# Patient Record
Sex: Male | Born: 1960
Health system: Southern US, Community
[De-identification: ages and names within clinical notes are randomized; demographics above are authoritative.]

## PROBLEM LIST (undated history)

## (undated) DIAGNOSIS — I639 Cerebral infarction, unspecified: Secondary | ICD-10-CM

## (undated) DIAGNOSIS — N186 End stage renal disease: Secondary | ICD-10-CM

## (undated) DIAGNOSIS — E119 Type 2 diabetes mellitus without complications: Secondary | ICD-10-CM

## (undated) DIAGNOSIS — N189 Chronic kidney disease, unspecified: Secondary | ICD-10-CM

## (undated) DIAGNOSIS — I10 Essential (primary) hypertension: Secondary | ICD-10-CM

## (undated) DIAGNOSIS — M1990 Unspecified osteoarthritis, unspecified site: Secondary | ICD-10-CM

## (undated) HISTORY — DX: Type 2 diabetes mellitus without complications: E11.9

## (undated) HISTORY — PX: NO PAST SURGERIES: SHX2092

## (undated) HISTORY — DX: Chronic kidney disease, unspecified: N18.9

## (undated) HISTORY — DX: Essential (primary) hypertension: I10

---

## 1998-01-30 ENCOUNTER — Ambulatory Visit (HOSPITAL_COMMUNITY): Admission: RE | Admit: 1998-01-30 | Discharge: 1998-01-30 | Payer: Self-pay | Admitting: Gastroenterology

## 2003-08-05 ENCOUNTER — Ambulatory Visit (HOSPITAL_COMMUNITY): Admission: RE | Admit: 2003-08-05 | Discharge: 2003-08-05 | Payer: Self-pay | Admitting: Family Medicine

## 2004-08-27 ENCOUNTER — Encounter: Admission: RE | Admit: 2004-08-27 | Discharge: 2004-11-25 | Payer: Self-pay | Admitting: Family Medicine

## 2005-12-12 ENCOUNTER — Ambulatory Visit (HOSPITAL_BASED_OUTPATIENT_CLINIC_OR_DEPARTMENT_OTHER): Admission: RE | Admit: 2005-12-12 | Discharge: 2005-12-12 | Payer: Self-pay | Admitting: Urology

## 2005-12-18 ENCOUNTER — Encounter: Admission: RE | Admit: 2005-12-18 | Discharge: 2005-12-18 | Payer: Self-pay | Admitting: Family Medicine

## 2005-12-26 ENCOUNTER — Ambulatory Visit (HOSPITAL_BASED_OUTPATIENT_CLINIC_OR_DEPARTMENT_OTHER): Admission: RE | Admit: 2005-12-26 | Discharge: 2005-12-26 | Payer: Self-pay | Admitting: Urology

## 2006-06-04 ENCOUNTER — Ambulatory Visit: Payer: Self-pay | Admitting: Family Medicine

## 2006-06-11 ENCOUNTER — Ambulatory Visit: Payer: Self-pay | Admitting: Family Medicine

## 2007-01-01 ENCOUNTER — Ambulatory Visit: Payer: Self-pay | Admitting: Family Medicine

## 2007-01-04 ENCOUNTER — Ambulatory Visit: Payer: Self-pay | Admitting: Family Medicine

## 2007-04-21 ENCOUNTER — Ambulatory Visit: Payer: Self-pay | Admitting: Family Medicine

## 2010-05-28 ENCOUNTER — Ambulatory Visit: Payer: Self-pay | Admitting: Family Medicine

## 2010-06-27 ENCOUNTER — Ambulatory Visit: Payer: Self-pay | Admitting: Family Medicine

## 2010-08-28 ENCOUNTER — Ambulatory Visit: Payer: Self-pay | Admitting: Family Medicine

## 2010-10-02 ENCOUNTER — Ambulatory Visit: Payer: Federal, State, Local not specified - PPO

## 2010-10-02 DIAGNOSIS — E119 Type 2 diabetes mellitus without complications: Secondary | ICD-10-CM

## 2010-10-02 DIAGNOSIS — I10 Essential (primary) hypertension: Secondary | ICD-10-CM

## 2010-10-04 ENCOUNTER — Encounter: Payer: Self-pay | Admitting: Internal Medicine

## 2010-10-10 ENCOUNTER — Encounter: Payer: Self-pay | Admitting: Internal Medicine

## 2010-10-15 ENCOUNTER — Encounter: Payer: Self-pay | Admitting: Family Medicine

## 2010-10-15 ENCOUNTER — Ambulatory Visit: Payer: Federal, State, Local not specified - PPO | Admitting: Family Medicine

## 2010-10-15 DIAGNOSIS — E1129 Type 2 diabetes mellitus with other diabetic kidney complication: Secondary | ICD-10-CM | POA: Insufficient documentation

## 2010-10-15 DIAGNOSIS — E785 Hyperlipidemia, unspecified: Secondary | ICD-10-CM | POA: Insufficient documentation

## 2010-10-15 DIAGNOSIS — E1165 Type 2 diabetes mellitus with hyperglycemia: Secondary | ICD-10-CM | POA: Insufficient documentation

## 2010-10-15 DIAGNOSIS — Z9119 Patient's noncompliance with other medical treatment and regimen: Secondary | ICD-10-CM | POA: Insufficient documentation

## 2010-10-15 DIAGNOSIS — I10 Essential (primary) hypertension: Secondary | ICD-10-CM | POA: Insufficient documentation

## 2010-10-15 DIAGNOSIS — R809 Proteinuria, unspecified: Secondary | ICD-10-CM | POA: Insufficient documentation

## 2010-10-15 DIAGNOSIS — Z91199 Patient's noncompliance with other medical treatment and regimen due to unspecified reason: Secondary | ICD-10-CM | POA: Insufficient documentation

## 2010-10-15 DIAGNOSIS — E782 Mixed hyperlipidemia: Secondary | ICD-10-CM

## 2010-10-15 LAB — CONVERTED CEMR LAB
ALT: 18 units/L (ref 0–53)
AST: 12 units/L (ref 0–37)
Albumin: 4 g/dL (ref 3.5–5.2)
Alkaline Phosphatase: 60 units/L (ref 39–117)
BUN: 12 mg/dL (ref 6–23)
Bilirubin Urine: NEGATIVE
CO2: 26 meq/L (ref 19–32)
Calcium: 9.3 mg/dL (ref 8.4–10.5)
Chloride: 102 meq/L (ref 96–112)
Cholesterol: 212 mg/dL — ABNORMAL HIGH (ref 0–200)
Creatinine, Ser: 0.78 mg/dL (ref 0.40–1.50)
Glucose, Bld: 192 mg/dL — ABNORMAL HIGH (ref 70–99)
Glucose, Urine, Semiquant: 100
HCT: 40 % (ref 39.0–52.0)
HDL: 41 mg/dL (ref >39)
Hemoglobin: 13.5 g/dL (ref 13.0–17.0)
Hgb A1c MFr Bld: 8.3 % — ABNORMAL HIGH (ref <5.7)
Ketones, urine, test strip: NEGATIVE
LDL Cholesterol: 150 mg/dL — ABNORMAL HIGH (ref 0–99)
MCHC: 33.8 g/dL (ref 30.0–36.0)
MCV: 83.5 fL (ref 78.0–100.0)
Microalbumin U total vol: 150 mg/L
Nitrite: POSITIVE
Platelets: 279 10*3/uL (ref 150–400)
Potassium: 4.4 meq/L (ref 3.5–5.3)
Protein, U semiquant: 100
RBC: 4.79 M/uL (ref 4.22–5.81)
RDW: 13.7 % (ref 11.5–15.5)
Sodium: 138 meq/L (ref 135–145)
Specific Gravity, Urine: 1.03
Total Bilirubin: 0.5 mg/dL (ref 0.3–1.2)
Total CHOL/HDL Ratio: 5.2
Total Protein: 7.3 g/dL (ref 6.0–8.3)
Triglycerides: 104 mg/dL (ref <150)
Urobilinogen, UA: 0.2
VLDL: 21 mg/dL (ref 0–40)
Vit D, 25-Hydroxy: 11 ng/mL — ABNORMAL LOW (ref 30–89)
WBC: 7.4 10*3/uL (ref 4.0–10.5)
pH: 6

## 2010-10-16 ENCOUNTER — Encounter: Payer: Self-pay | Admitting: Family Medicine

## 2010-10-17 ENCOUNTER — Encounter: Payer: Self-pay | Admitting: Family Medicine

## 2010-10-17 NOTE — Letter (Signed)
Summary: work excuse  work excuse   Imported By: Orma Flaming 10/10/2010 12:17:51  _____________________________________________________________________  External Attachment:    Type:   Image     Comment:   External Document

## 2010-10-17 NOTE — Progress Notes (Signed)
Summary: Office Visit 10/02/10  Office Visit 10/02/10   Imported By: Opal Sidles Breitmeier 10/10/2010 12:15:07  _____________________________________________________________________  External Attachment:    Type:   Image     Comment:   External Document

## 2010-10-17 NOTE — Progress Notes (Signed)
Summary: office visit 10/02/10  office visit 10/02/10   Imported By: Opal Sidles Breitmeier 10/10/2010 12:14:06  _____________________________________________________________________  External Attachment:    Type:   Image     Comment:   External Document

## 2010-10-17 NOTE — Assessment & Plan Note (Signed)
Summary: headaches, lower bottom pain

## 2010-10-21 ENCOUNTER — Encounter: Payer: Self-pay | Admitting: Family Medicine

## 2010-10-21 ENCOUNTER — Ambulatory Visit (INDEPENDENT_AMBULATORY_CARE_PROVIDER_SITE_OTHER): Payer: Federal, State, Local not specified - PPO | Admitting: Family Medicine

## 2010-10-21 DIAGNOSIS — R809 Proteinuria, unspecified: Secondary | ICD-10-CM

## 2010-10-21 DIAGNOSIS — E1165 Type 2 diabetes mellitus with hyperglycemia: Secondary | ICD-10-CM

## 2010-10-21 DIAGNOSIS — Z9119 Patient's noncompliance with other medical treatment and regimen: Secondary | ICD-10-CM

## 2010-10-21 DIAGNOSIS — I10 Essential (primary) hypertension: Secondary | ICD-10-CM

## 2010-10-21 DIAGNOSIS — E559 Vitamin D deficiency, unspecified: Secondary | ICD-10-CM

## 2010-10-21 DIAGNOSIS — E1129 Type 2 diabetes mellitus with other diabetic kidney complication: Secondary | ICD-10-CM

## 2010-10-21 DIAGNOSIS — E785 Hyperlipidemia, unspecified: Secondary | ICD-10-CM

## 2010-10-23 ENCOUNTER — Encounter: Payer: Self-pay | Admitting: Family Medicine

## 2010-10-23 NOTE — Letter (Signed)
Summary: Generic Letter  [facility] [place], [place] [address]   Phone: [phone]  Fax: [phone]    10/15/2010  [address] [place], [place]  [address]  [place]   LAB ORDER:  FOR [facility]  CBC, CMP, A1C, LIPID PANEL, 25-OH VIT D, URINE MICRO AND CULTURE    DIAGNOSES 250.02, 272.2, 401.9, 599.0, 599.1  PLEASE FAX RESULTS TO DR. Wynetta Emery [phone]       Sincerely,   Irwin Brakeman MD

## 2010-10-23 NOTE — Progress Notes (Signed)
Summary: [place] records  [facility] records   Imported By: Boykin Reaper 10/17/2010 14:20:51  _____________________________________________________________________  External Attachment:    Type:   Image     Comment:   External Document

## 2010-10-23 NOTE — Letter (Signed)
Summary: Out of Work  [facility]   [place],  [address]   Phone: [phone]  Fax: [phone]    October 15, 2010   Employee:  Joshua Case    To Whom It May Concern:   For Medical reasons, please excuse the above named employee from work for the following dates:  Start:   October 15, 2010   End:   October 17, 2010  If you need additional information, please feel free to contact our office.         Sincerely,    Irwin Brakeman MD

## 2010-10-23 NOTE — Assessment & Plan Note (Signed)
Summary: diabetes,headaches, prostate exam/jbb   Vital Signs:  Patient profile:   50 year old male Weight:      306 pounds O2 Sat:      99 % on Room air Temp:     98.7 degrees F oral Pulse rate:   76 / minute Resp:     15 per minute BP sitting:   188 / 105  (left arm)  O2 Flow:  Room air  Serial Vital Signs/Assessments:  Time      Position  BP       Pulse  Resp  Temp     By 235 P               177/108  [place] MD   History of Present Illness: The patient presented today because he has been concerned about the chronic daily headaches that he has endured over the past several months and worsening.  He reports that he is having headaches everyday.  He says he has not taken care of himself.  He says that he had stopped all of his medications for a long time and started back on them when he saw Dr. Thereasa Distance last week.  He has been taking 1000 mg of metformin twice daily and started taking Lisinopril 10/12.5 two tablets daily for the past 3 days.  His blood pressure is very high.  He is also reporting that he has a history of chronic prostatitis and is out of his ciprofloxacin.  He never filled it when he saw Dr. Redmond School in October of last year.  He is reporting that he is not having any symptoms of chest pain or shortness of breath.  He says that he is not checking his blood sugar regularly and has been having high BG readings.  He says that he is wanting to start to take better care of himself now.  He has not seen a dentist or eye care specialist in just over 1 year per pt.  He also has a podiatrist but has not seen him recently but is planning to go soon.     Past History:  Past Medical History: HTN Type 2 Diabetes Mellitus Dyslipidemia Metabolic Syndrome Microalbuminuria Chronic Prostatitis Noncompliance  Past Surgical History: Denies surgical history  Family History: HTN DM  Social History: Pt denies tobacco, alcohol and recreational drug  use.  Review of Systems General:  Denies chills, fatigue, fever, loss of appetite, malaise, sleep disorder, sweats, weakness, and weight loss. Eyes:  Complains of blurring and double vision; denies discharge, eye irritation, eye pain, halos, itching, light sensitivity, red eye, vision loss-1 eye, and vision loss-both eyes. ENT:  Complains of sinus pressure; denies decreased hearing, difficulty swallowing, ear discharge, earache, hoarseness, nasal congestion, nosebleeds, postnasal drainage, ringing in ears, and sore throat. CV:  Denies bluish discoloration of lips or nails, chest pain or discomfort, difficulty breathing at night, difficulty breathing while lying down, fainting, fatigue, leg cramps with exertion, lightheadness, near fainting, palpitations, shortness of breath with exertion, swelling of feet, swelling of hands, and weight gain. Resp:  Denies chest discomfort, chest pain with inspiration, cough, coughing up blood, excessive snoring, hypersomnolence, morning headaches, pleuritic, shortness of breath, sputum productive, and wheezing. GI:  Denies abdominal pain, bloody stools, change in bowel habits, constipation, dark tarry stools, diarrhea, excessive appetite, gas, hemorrhoids, indigestion, loss of appetite, nausea, vomiting, vomiting blood,  and yellowish skin color. GU:  Complains of urinary frequency; denies decreased libido, discharge, dysuria, erectile dysfunction, genital sores, hematuria, incontinence, nocturia, and urinary hesitancy. MS:  Denies joint pain, joint redness, joint swelling, loss of strength, low back pain, mid back pain, muscle aches, muscle , cramps, muscle weakness, stiffness, and thoracic pain. Derm:  Denies changes in color of skin, changes in nail beds, dryness, excessive perspiration, flushing, hair loss, insect bite(s), itching, lesion(s), poor wound healing, and rash. Neuro:  Denies brief paralysis, difficulty with concentration, disturbances in coordination,  falling down, headaches, inability to speak, memory loss, numbness, poor balance, seizures, sensation of room spinning, tingling, tremors, visual disturbances, and weakness. Psych:  Denies alternate hallucination ( auditory/visual), anxiety, depression, easily angered, easily tearful, irritability, mental problems, panic attacks, sense of great danger, suicidal thoughts/plans, thoughts of violence, unusual visions or sounds, and thoughts /plans of harming others. Endo:  Complains of excessive thirst, excessive urination, and polyuria. Allergy:  Denies hives or rash, itching eyes, persistent infections, seasonal allergies, and sneezing.  Physical Exam  General:  Well-developed,well-nourished,in no acute distress; alert,appropriate and cooperative throughout examination Head:  Normocephalic and atraumatic without obvious abnormalities. No apparent alopecia or balding. Eyes:  No corneal or conjunctival inflammation noted. EOMI. Perrla. Funduscopic exam benign, without hemorrhages, exudates or papilledema. Vision grossly normal. Ears:  External ear exam shows no significant lesions or deformities.  Otoscopic examination reveals clear canals, tympanic membranes are intact bilaterally without bulging, retraction, inflammation or discharge. Hearing is grossly normal bilaterally. Nose:  External nasal examination shows no deformity or inflammation. Nasal mucosa are pink and moist without lesions or exudates. Mouth:  Oral mucosa and oropharynx without lesions or exudates.  Teeth in good repair. Neck:  No deformities, masses, or tenderness noted. Lungs:  Normal respiratory effort, chest expands symmetrically. Lungs are clear to auscultation, no crackles or wheezes. Heart:  Normal rate and regular rhythm. S1 and S2 normal without gallop, murmur, click, rub or other extra sounds. Abdomen:  Bowel sounds positive,abdomen soft and non-tender without masses, organomegaly or hernias noted. Msk:  No deformity or  scoliosis noted of thoracic or lumbar spine.   Pulses:  R and L carotid,radial,femoral,dorsalis pedis and posterior tibial pulses are full and equal bilaterally Extremities:  No clubbing, cyanosis, edema, or deformity noted with normal full range of motion of all joints.   Neurologic:  No cranial nerve deficits noted. Station and gait are normal. Plantar reflexes are down-going bilaterally. DTRs are symmetrical throughout. Sensory, motor and coordinative functions appear intact. Skin:  Intact without suspicious lesions or rashes Cervical Nodes:  No lymphadenopathy noted Psych:  Cognition and judgment appear intact. Alert and cooperative with normal attention span and concentration. No apparent delusions, illusions, hallucinations  Lab Results Urinalysis:      Color:     Yellow    Appear:     Cloudy    Leuk:     Small (1+)    Nitr:     Pos    Urobil:     0.2    Prot:     100 (2+)    pH:     6.0    Blood:     Trace    Sp. Gr:     1.030    Ket:     Neg    Bili:     Neg    Glu:     100    creatinine 300 mg/dL    U Micralb:   150 mg/L  Problems:  Medical Problems Added: 1)  Dx of Pers Hx Noncompliance W/med Tx Prs [facility]  (ICD-V15.81) 2)  Dx of Microalbuminuria  (ICD-791.0) 3)  Dx of Diab W/renal Manifests Type Ii/uns Type Uncntrl  (ICD-250.42) 4)  Dx of Dyslipidemia  (ICD-272.4) 5)  Dx of Hypertension, Essential, Uncontrolled  (ICD-401.9)  Impression & Recommendations:  Problem # 1:  HYPERTENSION, ESSENTIAL, UNCONTROLLED (ICD-401.9)  His updated medication list for this problem includes:    Toprol Xl 25 Mg Xr24h-tab (Metoprolol succinate) .Marland Kitchen... Take 1 by mouth daily for blood pressure around the same time everyday    Amlodipine Besylate 5 Mg Tabs (Amlodipine besylate) .Marland Kitchen... Take 1 by mouth daily for blood pressure control    Lisinopril-hydrochlorothiazide 20-25 Mg Tabs (Lisinopril-hydrochlorothiazide) .Marland Kitchen... Take 1 by mouth daily for blood pressure  Problem # 2:  DIAB  W/RENAL MANIFESTS TYPE II/UNS TYPE UNCNTRL (ICD-250.42)  His updated medication list for this problem includes:    Lisinopril-hydrochlorothiazide 20-25 Mg Tabs (Lisinopril-hydrochlorothiazide) .Marland Kitchen... Take 1 by mouth daily for blood pressure    Glucophage Xr 500 Mg Xr24h-tab (Metformin hcl) .Marland Kitchen... Take 2 tabs by mouth two times a day ac  Problem # 3:  MICROALBUMINURIA (ICD-791.0)  Problem # 4:  DYSLIPIDEMIA (ICD-272.4)  Problem # 5:  PERS HX NONCOMPLIANCE W/MED TX PRS [facility] (ICD-V15.81)  Complete Medication List: 1)  Ciprofloxacin Hcl 500 Mg Tabs (Ciprofloxacin hcl) .... Take 1 by mouth two times a day until completed 2)  Toprol Xl 25 Mg Xr24h-tab (Metoprolol succinate) .... Take 1 by mouth daily for blood pressure around the same time everyday 3)  Amlodipine Besylate 5 Mg Tabs (Amlodipine besylate) .... Take 1 by mouth daily for blood pressure control 4)  Lisinopril-hydrochlorothiazide 20-25 Mg Tabs (Lisinopril-hydrochlorothiazide) .... Take 1 by mouth daily for blood pressure 5)  Glucophage Xr 500 Mg Xr24h-tab (Metformin hcl) .... Take 2 tabs by mouth two times a day ac  Patient Instructions: 1)  Go to the pharmacy and pick up your prescription (s).  It may take up to 30 mins for electronic prescriptions to be delivered to the pharmacy.  Please call if your pharmacy has not received your prescriptions after 30 minutes.   2)  Check your blood sugars regularly. If your readings are usually above : 250 or below 70 you should contact our office. 3)  It is important that your Diabetic A1c level is checked every 3 months. 4)  See your eye doctor yearly to check for diabetic eye damage. 5)  Check your feet each night for sore areas, calluses or signs of infection. 6)  Check your Blood Pressure regularly. If it is above:140/90 you should make an appointment. 7)  It is important that you exercise regularly at least 20 minutes 5 times a week. If you develop chest pain, have severe difficulty  breathing, or feel very tired , stop exercising immediately and seek medical attention. 8)  You need to lose weight. Consider a lower calorie diet and regular exercise.  9)  The patient was informed that there is no on-call provider or services available at this clinic during off-hours (when the clinic is closed).  If the patient developed a problem or concern that required immediate attention, the patient was advised to go the the nearest available urgent care or emergency department for medical care.  The patient verbalized understanding.    10)  Return or go to the ER if no improvement or symptoms getting worse.   11)  Come back in  1 week for BP and DM check 12)  Go to the lab and get the lab work done later today as requested.  Prescriptions: GLUCOPHAGE XR 500 MG XR24H-TAB (METFORMIN HCL) take 2 tabs by mouth two times a day AC  #120 x 3   Entered and Authorized by:   Irwin Brakeman MD   Signed by:   Irwin Brakeman MD on 10/15/2010   Method used:   Electronically to        [facility] (retail)       [address]       [place], [place]  [address]       Ph: [phone]       Fax: [phone]   RxID:   [id number] LISINOPRIL-HYDROCHLOROTHIAZIDE 20-25 MG TABS (LISINOPRIL-HYDROCHLOROTHIAZIDE) take 1 by mouth daily for blood pressure  #30 x 2   Entered and Authorized by:   Irwin Brakeman MD   Signed by:   Irwin Brakeman MD on 10/15/2010   Method used:   Electronically to        [facility] (retail)       [address]       [place], [place]  [address]       Ph: [phone]       Fax: [phone]   RxID:   [id number] AMLODIPINE BESYLATE 5 MG TABS (AMLODIPINE BESYLATE) take 1 by mouth daily for blood pressure control  #30 x 2   Entered and Authorized by:   Irwin Brakeman MD   Signed by:   Irwin Brakeman MD on 10/15/2010   Method used:   Electronically to        [facility] (retail)       [address]       [place], [place]  [address]       Ph: [phone]       Fax: [phone]   RxID:   [id number] TOPROL XL 25 MG XR24H-TAB (METOPROLOL SUCCINATE) take 1 by mouth daily for blood pressure around the same time everyday  #30 x 2   Entered and Authorized by:   Irwin Brakeman MD   Signed by:   Irwin Brakeman MD on 10/15/2010   Method used:   Electronically to        [facility] [address] (retail)       [address]       [place], [place]  [address]       Ph: [phone]       Fax: [phone]   RxID:   [id number] CIPROFLOXACIN HCL 500 MG TABS (CIPROFLOXACIN HCL) take 1 by mouth two times a day until completed  #20 x 0   Entered and Authorized by:   Irwin Brakeman MD   Signed by:   Irwin Brakeman MD on 10/15/2010   Method used:   Electronically to        [facility] (retail)       [address]       [place], [place]  [address]       Ph: [phone]       Fax: [phone]   RxID:   [id number]    Medication Administration  Medication # 1:  Medication: Clonidine 0.1mg  tab    Dose: 2 tablets    Route: po    I spent more than 65 mins with patient reviewing his complex medical history and trying to formulate a clear action plan with the patient and treating his uncontrolled hypertension.  I did serial BP assessments on  him as well.  I explained to the patient that he had microalbumin positive urine.  The patient verbalized understanding.  The risks, benefits and possible side effects were clearly explained and discussed with the patient.  The patient verbalized clear understanding.  The patient was given instructions to return if symptoms don't improve, worsen or new changes develop.  If it is not during clinic hours and the patient cannot get back to this clinic then the patient was told to seek medical care at an available urgent care or emergency department.  The patient  verbalized understanding.

## 2010-10-23 NOTE — Letter (Signed)
Summary: Work excuse letter  Work excuse letter   Imported By: Boykin Reaper 10/17/2010 14:17:23  _____________________________________________________________________  External Attachment:    Type:   Image     Comment:   External Document

## 2010-10-23 NOTE — Miscellaneous (Signed)
Summary: New RX Sent  Clinical Lists Changes  Medications: Added new medication of DRISDOL 03474 UNIT CAPS (ERGOCALCIFEROL) take 1 by mouth 2 times per week until completed - Signed Added new medication of PRAVASTATIN SODIUM 40 MG TABS (PRAVASTATIN SODIUM) take 1 by mouth at bedtime for cholesterol - Signed Rx of DRISDOL 25956 UNIT CAPS (ERGOCALCIFEROL) take 1 by mouth 2 times per week until completed;  #16 x 0;  Signed;  Entered by: Irwin Brakeman MD;  Authorized by: Irwin Brakeman MD;  Method used: Electronically to [facility] [address] [place], [place], [place]  [address], Ph: [phone], Fax: [phone] Rx of PRAVASTATIN SODIUM 40 MG TABS (PRAVASTATIN SODIUM) take 1 by mouth at bedtime for cholesterol;  #30 x 2;  Signed;  Entered by: Irwin Brakeman MD;  Authorized by: Irwin Brakeman MD;  Method used: Electronically to [facility] [address], [place], [place], [place]  [address], Ph: [phone], Fax: [phone]    Prescriptions: PRAVASTATIN SODIUM 40 MG TABS (PRAVASTATIN SODIUM) take 1 by mouth at bedtime for cholesterol  #30 x 2   Entered and Authorized by:   Irwin Brakeman MD   Signed by:   Irwin Brakeman MD on 10/16/2010   Method used:   Electronically to        [facility] (retail)       [address]       [place], [place]  [address]       Ph: [phone]       Fax: [phone]   RxID:   [id number] DRISDOL 16606 UNIT CAPS (ERGOCALCIFEROL) take 1 by mouth 2 times per week until completed  #16 x 0   Entered and Authorized by:   Irwin Brakeman MD   Signed by:   Irwin Brakeman MD on 10/16/2010   Method used:   Electronically to        [facility] [address] (retail)       [address]       [place], [place]  [address]       Ph: [phone]       Fax: [phone]   RxID:   [id number]

## 2010-10-29 NOTE — Assessment & Plan Note (Signed)
Summary: CHECK BLOOD PRESSURE/EVM   Vital Signs:  Patient profile:   50 year old male O2 Sat:      100 % on Room air Temp:     97.7 degrees F oral Pulse rate:   96 / minute Pulse rhythm:   regular Resp:     14 per minute BP sitting:   148 / 96  (right arm)  O2 Flow:  Room air  History of Present Illness: The patient presented today for a followup appointment as we had discussed one week ago. The patient says that he is no longer having the headaches. He only reports that he is taking one of the blood pressure medications and did not take the other 2 prescribed. He reports that he is not taking the amlodipine or Toprol at this time. He wanted to get more clarification today. He reports that he is feeling somewhat better. He reports that he still urinating frequently. I explained to him that the ciprofloxacin is not effective for the Escherichia coli strain that he has in his urine. I explained to him that he needed to take the new prescription for antibiotic that I called in for him called cephalexin. The patient reports that he Has Seen a Urologist in the Past but has not seen one recently. He reports that he is still having some trouble with his erections. He reports that his erections are not as firm as they used to be 10 years ago. He reports that he is still able to get an erection. The patient reports that he is taking his medication. The patient reports that he only tests his blood sugar one time and it was 170. He reports that he is having signif metformin. I told him that I had called in a different formulation of metformin in an extended release form at that he could take with less GI side effects. The patient verbalized understanding. The patient denies having chest pain and shortness of breath. The patient reports that he is still wanting to improve. I explained to the patient that he needs to test his blood sugars more frequently and watch his diet. We discussed better dietary options for  him including avoiding fast foods and high sodium. The patient verbalized understanding.  Allergies: No Known Drug Allergies  Past History:  Family History: Last updated: 10/15/2010 HTN DM  Social History: Last updated: 10/21/2010 Pt denies tobacco, alcohol and recreational drug use. The patient is married. The patient has 2 children in college.  Past Medical History: HTN Type 2 Diabetes Mellitus Dyslipidemia Metabolic Syndrome Microalbuminuria Chronic Prostatitis Noncompliance Vitamin D deficiency  Past Surgical History: Reviewed history from 10/15/2010 and no changes required. Denies surgical history  Family History: Reviewed history from 10/15/2010 and no changes required. HTN DM  Social History: Reviewed history from 10/15/2010 and no changes required. Pt denies tobacco, alcohol and recreational drug use. The patient is married. The patient has 2 children in college.  Review of Systems General:  Denies chills, fatigue, fever, loss of appetite, malaise, sleep disorder, sweats, weakness, and weight loss. Eyes:  Denies blurring, discharge, double vision, eye irritation, eye pain, halos, itching, light sensitivity, red eye, vision loss-1 eye, and vision loss-both eyes. ENT:  Denies decreased hearing, difficulty swallowing, ear discharge, earache, hoarseness, nasal congestion, nosebleeds, postnasal drainage, ringing in ears, sinus pressure, and sore throat. CV:  Denies bluish discoloration of lips or nails, chest pain or discomfort, difficulty breathing at night, difficulty breathing while lying down, fainting, fatigue, leg cramps  with exertion, lightheadness, near fainting, palpitations, shortness of breath with exertion, swelling of feet, swelling of hands, and weight gain. Resp:  Denies chest discomfort, chest pain with inspiration, cough, coughing up blood, excessive snoring, hypersomnolence, morning headaches, pleuritic, shortness of breath, sputum productive, and  wheezing. GI:  Denies abdominal pain, bloody stools, change in bowel habits, constipation, dark tarry stools, diarrhea, excessive appetite, gas, hemorrhoids, indigestion, loss of appetite, nausea, vomiting, vomiting blood, and yellowish skin color. GU:  Complains of urinary frequency. MS:  Denies joint pain, joint redness, joint swelling, loss of strength, low back pain, mid back pain, muscle aches, muscle , cramps, muscle weakness, stiffness, and thoracic pain. Derm:  Denies changes in color of skin, changes in nail beds, dryness, excessive perspiration, flushing, hair loss, insect bite(s), itching, lesion(s), poor wound healing, and rash. Neuro:  Denies brief paralysis, difficulty with concentration, disturbances in coordination, falling down, headaches, inability to speak, memory loss, numbness, poor balance, seizures, sensation of room spinning, tingling, tremors, visual disturbances, and weakness. Psych:  Denies alternate hallucination ( auditory/visual), anxiety, depression, easily angered, easily tearful, irritability, mental problems, panic attacks, sense of great danger, suicidal thoughts/plans, thoughts of violence, unusual visions or sounds, and thoughts /plans of harming others.  Physical Exam  General:  Well-developed,well-nourished,in no acute distress; alert,appropriate and cooperative throughout examination Head:  Normocephalic and atraumatic without obvious abnormalities. No apparent alopecia or balding. Eyes:  No corneal or conjunctival inflammation noted. EOMI. Perrla. Funduscopic exam benign, without hemorrhages, exudates or papilledema. Vision grossly normal. Ears:  External ear exam shows no significant lesions or deformities.  Otoscopic examination reveals clear canals, tympanic membranes are intact bilaterally without bulging, retraction, inflammation or discharge. Hearing is grossly normal bilaterally. Nose:  External nasal examination shows no deformity or inflammation. Nasal  mucosa are pink and moist without lesions or exudates. Mouth:  Oral mucosa and oropharynx without lesions or exudates.  Teeth in good repair. Neck:  No deformities, masses, or tenderness noted. Lungs:  Normal respiratory effort, chest expands symmetrically. Lungs are clear to auscultation, no crackles or wheezes. Heart:  Normal rate and regular rhythm. S1 and S2 normal without gallop, murmur, click, rub or other extra sounds. Abdomen:  Bowel sounds positive,abdomen soft and non-tender without masses, organomegaly or hernias noted. obese. Msk:  No deformity or scoliosis noted of thoracic or lumbar spine.   Pulses:  R and L carotid,radial,femoral,dorsalis pedis and posterior tibial pulses are full and equal bilaterally Extremities:  No clubbing, cyanosis, edema, or deformity noted with normal full range of motion of all joints.   Neurologic:  No cranial nerve deficits noted. Station and gait are normal. Plantar reflexes are down-going bilaterally. DTRs are symmetrical throughout. Sensory, motor and coordinative functions appear intact. Skin:  Intact without suspicious lesions or rashes Cervical Nodes:  No lymphadenopathy noted Psych:  Cognition and judgment appear intact. Alert and cooperative with normal attention span and concentration. No apparent delusions, illusions, hallucinations   Problems:  Medical Problems Added: 1)  Dx of Unspecified Vitamin D Deficiency  (ICD-268.9)  Impression & Recommendations:  Problem # 1:  HYPERTENSION, ESSENTIAL, UNCONTROLLED (ICD-401.9)  His updated medication list for this problem includes:    Toprol Xl 25 Mg Xr24h-tab (Metoprolol succinate) .Marland Kitchen... Take 1 by mouth daily for blood pressure around the same time everyday    Amlodipine Besylate 5 Mg Tabs (Amlodipine besylate) .Marland Kitchen... Take 1 by mouth daily for blood pressure control    Lisinopril-hydrochlorothiazide 20-25 Mg Tabs (Lisinopril-hydrochlorothiazide) .Marland Kitchen... Take 1 by mouth daily for  blood  pressure  Because the patient is starting to experience correction difficulties, also, because he is having some improvement in blood pressure control now, I asked the patient to take the lisinopril hydrochlorothiazide and the Toprol-XL every morning. I asked him to hold the amlodipine 5 mg tablets until he is reevaluated in 3 weeks. I think that the amlodipine may worse the erection problems. Also, I explained to the patient that blood pressure medication in general may affect male sexual potency. The patient verbalized understanding.  I continue to explained to the patient that a low sodium diet was essential and regular exercise was needed. The patient verbalized understanding. I asked him to please followup in 3 weeks for recheck and to call if his symptoms are not improving. I also asked the patient to make an appointment to see his urologist regarding his sexual problems. Also I like for his prostate to be evaluated again because of his history of prostatitis.  Problem # 2:  UNSPECIFIED VITAMIN D DEFICIENCY (ICD-268.9) The patient was given instructions to continue taking the 50,000 international unit vitamin D capsules for the next 8 weeks. After that I gave the patient permission to continue taking vitamin D supplements over-the-counter daily.  Problem # 3:  MICROALBUMINURIA (ICD-791.0) I gave the patient instructions to Continue Taking Lisinopril Hydrochlorothiazide Daily and Controlling Blood Pressure.  Problem # 4:  DIAB W/RENAL MANIFESTS TYPE II/UNS TYPE UNCNTRL (ICD-250.42)  His updated medication list for this problem includes:    Lisinopril-hydrochlorothiazide 20-25 Mg Tabs (Lisinopril-hydrochlorothiazide) .Marland Kitchen... Take 1 by mouth daily for blood pressure    Glucophage Xr 500 Mg Xr24h-tab (Metformin hcl) .Marland Kitchen... Take 2 tabs by mouth two times a day ac  I asked the patient to please start taking his blood sugars More regularly and seriously. I asked the patient to take his medications as  prescribed. I told him to discontinue the immediate-release metformin and to start taking the extended release formulation. The patient verbalized understanding.  I also reminded the patient for his annual eye care and biannual dental evaluations. The patient verbalized clear understanding.    Problem # 5:  DYSLIPIDEMIA (ICD-272.4)  His updated medication list for this problem includes:    Pravastatin Sodium 40 Mg Tabs (Pravastatin sodium) .Marland Kitchen... Take 1 by mouth at bedtime for cholesterol  Problem # 6:  PERS HX NONCOMPLIANCE W/MED TX PRS HAZARDS HLTH (ICD-V15.81)  Complete Medication List: 1)  Toprol Xl 25 Mg Xr24h-tab (Metoprolol succinate) .... Take 1 by mouth daily for blood pressure around the same time everyday 2)  Amlodipine Besylate 5 Mg Tabs (Amlodipine besylate) .... Take 1 by mouth daily for blood pressure control 3)  Lisinopril-hydrochlorothiazide 20-25 Mg Tabs (Lisinopril-hydrochlorothiazide) .... Take 1 by mouth daily for blood pressure 4)  Glucophage Xr 500 Mg Xr24h-tab (Metformin hcl) .... Take 2 tabs by mouth two times a day ac 5)  Drisdol 50000 Unit Caps (Ergocalciferol) .... Take 1 by mouth 2 times per week until completed 6)  Pravastatin Sodium 40 Mg Tabs (Pravastatin sodium) .... Take 1 by mouth at bedtime for cholesterol 7)  Cephalexin 250 Mg Tabs (Cephalexin) .... Take 1 by mouth every 6 hours x 10 days  Patient Instructions: 1)  Go to the pharmacy and pick up your prescription (s).  It may take up to 30 mins for electronic prescriptions to be delivered to the pharmacy.  Please call if your pharmacy has not received your prescriptions after 30 minutes.   2)  Check your blood sugars regularly.  If your readings are usually above : 250 or below 70 you should contact our office. 3)  Return or go to the ER if no improvement or symptoms getting worse.   4)  Please return in 3 weeks for recheck. Please continue to monitor your blood pressure at least once per week. Please take her  blood pressure medications as prescribed. 5)  Please hold the amlodipine 5 mg. Did not take the amlodipine at this time.  I would like for you to take your metoprolol and lisinopril medications every day.  Also, please take your cholesterol medication and finish her antibiotics as prescribed. I would like to have your urine retested and cultured in approximately 2 weeks to 3 weeks time. You can have a retested when he followup in 3 weeks. 6)  Please make an appointment to see her urologist to followup on your prostate and other personal matters.     I spent more than 65 minutes with the patient today reviewing his complicated medical history, counseling him with diet and diabetes management. Also, I spent a significant amount of time with the patient explaining to him the appropriate use of his medications and the importance of close followup and regular use of medications for chronic medical conditions like his diabetes and hypertension. The patient verbalized clear understanding.

## 2010-10-29 NOTE — Letter (Signed)
Summary: [facility] of [place]  [address]   [place], [place] [address]   Phone: [phone]  Fax: [phone]    October 21, 2010   Student:  Rockey Situ Borger    To Whom It May Concern:   For Medical reasons, please excuse the above named patient from work for the days missed because of a chronic medical condition that required evaluation and treatment. The patient is cleared to return to work on October 22, 2010.  The patient is being treated for uncontrolled hypertension and diabetes mellitus.      Sincerely,    Irwin Brakeman MD    ****This is a legal document and cannot be tampered with.  Schools are authorized to verify all information and to do so accordingly.

## 2010-10-29 NOTE — Miscellaneous (Signed)
Summary: New RX  Clinical Lists Changes  Medications: Removed medication of CIPROFLOXACIN HCL 500 MG TABS (CIPROFLOXACIN HCL) take 1 by mouth two times a day until completed Added new medication of CEPHALEXIN 250 MG TABS (CEPHALEXIN) take 1 by mouth every 6 hours x 10 days - Signed Rx of CEPHALEXIN 250 MG TABS (CEPHALEXIN) take 1 by mouth every 6 hours x 10 days;  #40 x 0;  Signed;  Entered by: Irwin Brakeman MD;  Authorized by: Irwin Brakeman MD;  Method used: Electronically to [facility] [address], [place], [place], [place]  [address], Ph: [phone], Fax: [phone] Observations: Added new observation of ALLERGY REV: Done (10/21/2010 11:39) Added new observation of NKA: T (10/21/2010 11:39)    Prescriptions: CEPHALEXIN 250 MG TABS (CEPHALEXIN) take 1 by mouth every 6 hours x 10 days  #40 x 0   Entered and Authorized by:   Irwin Brakeman MD   Signed by:   Irwin Brakeman MD on 10/21/2010   Method used:   Electronically to        [facility] (retail)       [address]       [place], [place]  [address]       Ph: [phone]       Fax: [phone]   RxID:   [id number]

## 2010-10-29 NOTE — Letter (Signed)
Summary: Work excuse  Work excuse   Imported By: Boykin Reaper 10/23/2010 19:17:15  _____________________________________________________________________  External Attachment:    Type:   Image     Comment:   External Document

## 2011-01-17 NOTE — Op Note (Signed)
[name], [name]                ACCOUNT NO.:  [id number]   MEDICAL RECORD NO.:  [id number]          PATIENT TYPE:  AMB   LOCATION:  [facility]                         FACILITY:  [facility]   PHYSICIAN:  Hanley Ben, M.D.  DATE OF BIRTH:  25-May-1961   DATE OF PROCEDURE:  12/26/2005  DATE OF DISCHARGE:                                 OPERATIVE REPORT   PREOPERATIVE DIAGNOSIS:  Phimosis.   POSTOPERATIVE DIAGNOSIS:  Phimosis.   PROCEDURE DONE:  Circumcision.   ANESTHESIA:  General.   SURGEON:  Arvil Persons, M.D.   ASSISTANT:  Lucie Leather, MD   BLOOD LOSS:  Minimal.   SPECIMENS:  None applicable.   COMPLICATIONS:  None.   INDICATIONS:  This is a 50 year old gentleman who is not circumcised and has  been suffering from phimosis and balanitis.  After extension counseling, the  patient elected for circumcision.   DESCRIPTION OF PROCEDURE:  Patient was brought to the operating room.  A  time out was taken to properly identify the patient and procedure going to  be done.  General anesthesia was induced.  The patient was placed in the  supine position.  His penile area was prepped and draped in the normal  systolic function.  The foreskin was then pulled across the corona in its  normal, unretracted position.  The area of the skin just above the corona  was marked, and then cut sharply with a knife.  The distal incision was then  made, about 5 mm proximal to the corona on the mucosa.  The foreskin in  between the proximal and the distant incision was then incised and cut  circumferentially.  The bleeders were then coagulated by Bovie cautery.  The  skin was then closed with 4-0 chromic interrupted sutures.  The wound was  then dressed with Vaseline gauze and antibiotic ointment.  Conform dressing  was then applied.  A penile block of 10 cc of lidocaine without epinephrine,  0.25%, was then injected.  Patient was then awakened from anesthesia with no  complications and taken in stable  condition to the PACU.   COMPLICATIONS:  None.   DISPOSITION:  Patient remained stable throughout the procedure and was taken  in stable condition to PACU.   Dr. Janice Norrie was present and participated in the entire procedure, as he was the  primary surgeon.     ______________________________  Joshua Lombard, MD      Hanley Ben, M.D.  Electronically Signed    JH/MEDQ  D:  12/26/2005  T:  12/26/2005  Job:  [id number]

## 2011-04-02 DIAGNOSIS — Z0000 Encounter for general adult medical examination without abnormal findings: Secondary | ICD-10-CM | POA: Insufficient documentation

## 2011-04-02 DIAGNOSIS — M549 Dorsalgia, unspecified: Secondary | ICD-10-CM | POA: Insufficient documentation

## 2011-07-02 ENCOUNTER — Ambulatory Visit: Payer: Federal, State, Local not specified - PPO | Admitting: *Deleted

## 2013-07-13 ENCOUNTER — Ambulatory Visit (INDEPENDENT_AMBULATORY_CARE_PROVIDER_SITE_OTHER): Payer: Federal, State, Local not specified - PPO | Admitting: Family Medicine

## 2013-07-13 VITALS — BP 128/90 | HR 82 | Temp 98.0°F | Resp 16 | Ht 74.0 in | Wt 319.0 lb

## 2013-07-13 DIAGNOSIS — Z23 Encounter for immunization: Secondary | ICD-10-CM

## 2013-07-13 DIAGNOSIS — R3989 Other symptoms and signs involving the genitourinary system: Secondary | ICD-10-CM

## 2013-07-13 DIAGNOSIS — M5126 Other intervertebral disc displacement, lumbar region: Secondary | ICD-10-CM

## 2013-07-13 DIAGNOSIS — M5116 Intervertebral disc disorders with radiculopathy, lumbar region: Secondary | ICD-10-CM | POA: Insufficient documentation

## 2013-07-13 LAB — POCT URINALYSIS DIPSTICK
Bilirubin, UA: NEGATIVE
Glucose, UA: 500
Ketones, UA: NEGATIVE
Leukocytes, UA: NEGATIVE
Nitrite, UA: NEGATIVE
Protein, UA: 100
Spec Grav, UA: >=1.03
Urobilinogen, UA: 0.2
pH, UA: 5

## 2013-07-13 LAB — POCT UA - MICROSCOPIC ONLY
Amorphous: POSITIVE
Bacteria, U Microscopic: NEGATIVE
Casts, Ur, LPF, POC: NEGATIVE
Crystals, Ur, HPF, POC: NEGATIVE
Mucus, UA: POSITIVE
Yeast, UA: NEGATIVE

## 2013-07-13 MED ORDER — METHOCARBAMOL 750 MG PO TABS: ORAL_TABLET | ORAL | Status: DC

## 2013-07-13 MED ORDER — HYDROCODONE-ACETAMINOPHEN 7.5-325 MG PO TABS: 1.0000 | ORAL_TABLET | Freq: Four times a day (QID) | ORAL | Status: DC | PRN

## 2013-07-13 MED ORDER — DICLOFENAC SODIUM 75 MG PO TBEC: 75.0000 mg | DELAYED_RELEASE_TABLET | Freq: Two times a day (BID) | ORAL | Status: DC

## 2013-07-13 NOTE — Progress Notes (Signed)
Subjective: 52 year old man who is here with a couple of problems. For the past week or so he has been having problems with right lumbar radiculopathy going down his leg. He knows of no specific injury. One day he just got up and when he straightened up by his back was hurting him from the back down the leg. He does have a history of well-documented lumbar disc disease, has had several MRIs in the past. He's had to be treated with steroids and other modalities in the past. He has never had surgeries. He works at the [employer], and is on his feet with having to lift a lot of things. He has not been able to works since November 5.  Patient does have some bladder pressure sensation. He has a sensation of needing to urinate sometimes when he doesn't need to go. His groin discomfort is part of this. He has had urinary tract infections in the past, and needs to be certain on that.  He has been taking some Aleve for his back, and thinks he needs something more. He has had steroids for his back in the past, but he is diabetic and his last A1c was probably around 7.5 with glucose is running in the 180 range.  Objective: Overweight male in no major acute distress. The worst his pain has been was around 8/10, and currently is about a 5/10. He is not particularly tender in the lumbar spine and has fair range of motion of his back. Straight leg raise test is positive at about 30 on the right, negative on the left. No incontinence. No suprapubic tenderness.  Assessment: Lumbar disc his disease with right lumbar radiculopathy Bladder pressure  Plan: Check urinalysis If symptoms do not improve within a few more days of rest and pain medication and muscle accident they would need to consider checking additional labs on him and a round of steroids if his diabetes can tolerate it. Work excuse through this weekend.  Results for orders placed in visit on 07/13/13  POCT UA - MICROSCOPIC ONLY      Result  Value Range   WBC, Ur, HPF, POC 0-2     RBC, urine, microscopic 1-4     Bacteria, U Microscopic Neg     Mucus, UA Pos     Epithelial cells, urine per micros 0-2     Crystals, Ur, HPF, POC Neg     Casts, Ur, LPF, POC Neg     Yeast, UA Neg     Amorphous pos    POCT URINALYSIS DIPSTICK      Result Value Range   Color, UA yellow     Clarity, UA clear     Glucose, UA 500     Bilirubin, UA neg     Ketones, UA neg     Spec Grav, UA >=1.030     Blood, UA Small     pH, UA 5.0     Protein, UA 100     Urobilinogen, UA 0.2     Nitrite, UA neg     Leukocytes, UA Negative

## 2013-07-13 NOTE — Patient Instructions (Signed)
Drink plenty of fluids  Take the muscle relaxant one in the morning, one at noon, and 2 at bedtime  Take the diclofenac one twice daily for pain and inflammation. Following this did not take the naproxen/Aleve/ibuprofen  Use the hydrocodone only when needed for severe pain. We will not be able to maintain you on narcotic strength pain medications long-term.   If you're not improving, we will need to check your diabetes further and consider prednisone.

## 2013-10-24 ENCOUNTER — Emergency Department (HOSPITAL_COMMUNITY)
Admission: EM | Admit: 2013-10-24 | Discharge: 2013-10-25 | Disposition: A | Discharge status: Home / Self Care | Payer: Federal, State, Local not specified - PPO | Attending: Emergency Medicine | Admitting: Emergency Medicine

## 2013-10-24 ENCOUNTER — Emergency Department (HOSPITAL_COMMUNITY): Payer: Federal, State, Local not specified - PPO

## 2013-10-24 ENCOUNTER — Encounter (HOSPITAL_COMMUNITY): Payer: Self-pay | Admitting: Emergency Medicine

## 2013-10-24 DIAGNOSIS — Z79899 Other long term (current) drug therapy: Secondary | ICD-10-CM | POA: Insufficient documentation

## 2013-10-24 DIAGNOSIS — G8929 Other chronic pain: Secondary | ICD-10-CM | POA: Insufficient documentation

## 2013-10-24 DIAGNOSIS — R5383 Other fatigue: Secondary | ICD-10-CM

## 2013-10-24 DIAGNOSIS — I10 Essential (primary) hypertension: Secondary | ICD-10-CM | POA: Insufficient documentation

## 2013-10-24 DIAGNOSIS — M5412 Radiculopathy, cervical region: Secondary | ICD-10-CM | POA: Insufficient documentation

## 2013-10-24 DIAGNOSIS — R209 Unspecified disturbances of skin sensation: Secondary | ICD-10-CM | POA: Insufficient documentation

## 2013-10-24 DIAGNOSIS — N390 Urinary tract infection, site not specified: Secondary | ICD-10-CM

## 2013-10-24 DIAGNOSIS — E119 Type 2 diabetes mellitus without complications: Secondary | ICD-10-CM | POA: Insufficient documentation

## 2013-10-24 DIAGNOSIS — R5381 Other malaise: Secondary | ICD-10-CM | POA: Insufficient documentation

## 2013-10-24 LAB — URINALYSIS, ROUTINE W REFLEX MICROSCOPIC
Bilirubin Urine: NEGATIVE
Glucose, UA: 100 mg/dL — AB
Ketones, ur: 15 mg/dL — AB
Nitrite: NEGATIVE
Protein, ur: 100 mg/dL — AB
Specific Gravity, Urine: 1.027 (ref 1.005–1.030)
Urobilinogen, UA: 0.2 mg/dL (ref 0.0–1.0)
pH: 5.5 (ref 5.0–8.0)

## 2013-10-24 LAB — CBG MONITORING, ED: Glucose-Capillary: 199 mg/dL — ABNORMAL HIGH (ref 70–99)

## 2013-10-24 LAB — URINE MICROSCOPIC-ADD ON

## 2013-10-24 NOTE — ED Notes (Signed)
Lab contacted to add-on Urine Culture.

## 2013-10-24 NOTE — ED Notes (Signed)
Spoke with Dr. Roxanne Mins regarding pt and delay. Pt updated.

## 2013-10-24 NOTE — ED Provider Notes (Signed)
CSN: [id number]     Arrival date & time 10/24/13  1354 History   First MD Initiated Contact with Patient 10/24/13 2142     Chief Complaint  Patient presents with  . Arm Pain     (Consider location/radiation/quality/duration/timing/severity/associated sxs/prior Treatment) The history is provided by the patient.   53 year old male has noted neck pain radiating to the left arm and weakness over his left arm over the last month. Weakness has been getting progressively worse. Pain is moderate in intensity and he rates it as 6/10 at its worst. He has noted that he has a difficulty raising his arm over his head and difficulty lifting things with his arm. His job does involve lifting and he can use his left arm has a brace. She has to lift with his right arm. He has noted some numbness involving his left second, third, fourth fingers. He has chronic back pain which is unchanged. He has some mild weakness of his legs which is also chronic and unchanged. Of note, he did have a fall about 2 months ago. He is not taking any medication to try and help his symptoms. He denies bowel or bladder dysfunction.  Past Medical History  Diagnosis Date  . Diabetes mellitus without complication   . Hypertension    History reviewed. No pertinent past surgical history. Family History  Problem Relation Age of Onset  . Heart disease Mother   . Diabetes Father   . Hypertension Father   . Diabetes Daughter   . Hypertension Daughter    History  Substance Use Topics  . Smoking status: Never Smoker   . Smokeless tobacco: Not on file  . Alcohol Use: No    Review of Systems  All other systems reviewed and are negative.      Allergies  Review of patient's allergies indicates no known allergies.  Home Medications   Current Outpatient Rx  Name  Route  Sig  Dispense  Refill  . amLODipine (NORVASC) 5 MG tablet   Oral   Take 5 mg by mouth daily.         Marland Kitchen glyBURIDE (DIABETA) 5 MG tablet   Oral   Take 5  mg by mouth 2 (two) times daily with a meal.         . lisinopril-hydrochlorothiazide (PRINZIDE,ZESTORETIC) 20-12.5 MG per tablet   Oral   Take 1 tablet by mouth daily.         . metFORMIN (GLUCOPHAGE) 1000 MG tablet   Oral   Take 1,000 mg by mouth 2 (two) times daily with a meal.         . methocarbamol (ROBAXIN) 750 MG tablet   Oral   Take 750 mg by mouth every 6 (six) hours as needed for muscle spasms.          BP 129/86  Pulse 80  Temp(Src) 98.4 F (36.9 C) (Oral)  Resp 18  SpO2 97% Physical Exam  Nursing note and vitals reviewed.  53 year old male, resting comfortably and in no acute distress. Vital signs are significant for hypertension with blood pressure 167/97. Oxygen saturation is 92%, which is normal. Head is normocephalic and atraumatic. PERRLA, EOMI. Oropharynx is clear. Neck is mildly tender diffusely without any point tenderness. Pain is elicited with lateral bending of the head to the right. There is no adenopathy or JVD. Back is nontender and there is no CVA tenderness. Lungs are clear without rales, wheezes, or rhonchi. Chest is nontender. Heart has  regular rate and rhythm without murmur. Abdomen is soft, flat, nontender without masses or hepatosplenomegaly and peristalsis is normoactive. Extremities have no cyanosis or edema, full range of motion is present. Skin is warm and dry without rash. Neurologic: Mental status is normal, cranial nerves are intact. There no objective sensory deficits. He has minimal weakness of the right triceps with strength 4+/5, mild weakness of his right biceps with strength 4/5, and moderate weakness of the right deltoid with strength 3/5.  ED Course  Procedures (including critical care time) Labs Review Results for orders placed during the hospital encounter of 10/24/13  URINALYSIS, ROUTINE W REFLEX MICROSCOPIC      Result Value Ref Range   Color, Urine YELLOW  YELLOW   APPearance CLOUDY (*) CLEAR   Specific Gravity,  Urine 1.027  1.005 - 1.030   pH 5.5  5.0 - 8.0   Glucose, UA 100 (*) NEGATIVE mg/dL   Hgb urine dipstick SMALL (*) NEGATIVE   Bilirubin Urine NEGATIVE  NEGATIVE   Ketones, ur 15 (*) NEGATIVE mg/dL   Protein, ur 100 (*) NEGATIVE mg/dL   Urobilinogen, UA 0.2  0.0 - 1.0 mg/dL   Nitrite NEGATIVE  NEGATIVE   Leukocytes, UA MODERATE (*) NEGATIVE  URINE MICROSCOPIC-ADD ON      Result Value Ref Range   Squamous Epithelial / LPF FEW (*) RARE   WBC, UA TOO NUMEROUS TO COUNT  <3 WBC/hpf   Bacteria, UA FEW (*) RARE   Casts HYALINE CASTS (*) NEGATIVE   Urine-Other MUCOUS PRESENT    CBG MONITORING, ED      Result Value Ref Range   Glucose-Capillary 199 (*) 70 - 99 mg/dL   Comment 1 Notify RN     Comment 2 Documented in Chart      Imaging Review Ct Cervical Spine Wo Contrast  10/25/2013   CLINICAL DATA:  Shoulder pain and weakness for 3 weeks, worsening pain with movement.  EXAM: CT CERVICAL SPINE WITHOUT CONTRAST  TECHNIQUE: Multidetector CT imaging of the cervical spine was performed without intravenous contrast. Multiplanar CT image reconstructions were also generated.  COMPARISON:  None available for comparison at time of study interpretation.  FINDINGS: Cervical vertebral bodies and posterior elements are intact and aligned with straightened cervical lordosis. Severe C5-6, moderate to severe C4-5 and C6-7 degenerative disc disease. C1-2 articulation maintained with mild arthropathy. No destructive bony lesions. Small bilateral C7 ribs. Minimal calcific atherosclerosis of the carotid siphons.  Level by level evaluation:  C2-3: Uncovertebral hypertrophy, bowel osseous canal stenosis. Mild left neural foraminal narrowing.  C3-4: Moderate broad-based disc osteophyte complex, mild facet arthropathy. Mild to moderate canal stenosis with severe right greater than left neural foraminal narrowing.  C4-5: Moderate broad-based disc osteophyte complex and mild facet arthropathy. Moderate canal stenosis with  moderate-to-severe bilateral neural foraminal narrowing.  C5-6: Moderate broad-based disc osteophyte complex and minimal facet arthropathy. Moderate canal stenosis with moderate to severe right, severe left neural foraminal narrowing.  C6-7: Moderate broad-based disc osteophyte complex. Moderate canal stenosis with mild to moderate right, moderate left neural foraminal narrowing.  C7-T1: Small broad-based disc osteophyte complex without canal stenosis. Mild neural foraminal narrowing.  IMPRESSION: Straightened cervical lordosis without acute fracture nor malalignment.  Degenerative change of the cervical spine resulting in moderate canal stenosis C4-5 through C6-7, mild-to-moderate C3-4.  Neural foraminal narrowing at all cervical levels, moderate to severe/severe from C3-4 through C5-6.  Constellation of findings would be better characterized on MRI of the cervical spine, as clinically  indicated.   Electronically Signed   By: Elon Alas   On: 10/25/2013 00:05   MDM   Final diagnoses:  Cervical radiculopathy  Urinary tract infection    Neck pain with significant weakness strongly suggestive of cervical radiculopathy. MRI is not immediately available so he'll be sent for CT scan. He'll need to be referred to neurosurgery. Also, urinalysis is significant for UTI.  CT shows multiple areas of potential neural compression, but C5 is the most likely culprit. He is given a prescription for cephalexin for UTI, and oxycodone-acetaminophen for pain. Work note given for no lifting with left arm.  Delora Fuel, MD [id number] [id number]

## 2013-10-24 NOTE — ED Notes (Signed)
Dr. Glick at bedside.  

## 2013-10-24 NOTE — ED Notes (Signed)
Pt reports approx 2 months ago, before Christmas, pt has episode where he "passed out" in his chair and woke back up and every since then he has been experiencing intermittent numbness/tingling and limited/painful AROM with LUE

## 2013-10-24 NOTE — ED Notes (Signed)
Pt reports shoulder pain and weakness about 3 weeks ago. Reports increased pain with movement and has a difficult time rising that arm. States that he was seen at [facility] for the same about a week ago.

## 2013-10-25 MED ORDER — OXYCODONE-ACETAMINOPHEN 5-325 MG PO TABS: 1.0000 | ORAL_TABLET | ORAL | Status: DC | PRN

## 2013-10-25 MED ORDER — CEPHALEXIN 500 MG PO CAPS: 500.0000 mg | ORAL_CAPSULE | Freq: Four times a day (QID) | ORAL | Status: DC

## 2013-10-25 NOTE — Discharge Instructions (Signed)
Your CT scan shows problems at multiple spots in your neck. Call the neurosurgeon tomorrow for an appointment as soon as possible.  Cervical Radiculopathy Cervical radiculopathy happens when a nerve in the neck is pinched or bruised by a slipped (herniated) disk or by arthritic changes in the bones of the cervical spine. This can occur due to an injury or as part of the normal aging process. Pressure on the cervical nerves can cause pain or numbness that runs from your neck all the way down into your arm and fingers. CAUSES  There are many possible causes, including:  Injury.  Muscle tightness in the neck from overuse.  Swollen, painful joints (arthritis).  Breakdown or degeneration in the bones and joints of the spine (spondylosis) due to aging.  Bone spurs that may develop near the cervical nerves. SYMPTOMS  Symptoms include pain, weakness, or numbness in the affected arm and hand. Pain can be severe or irritating. Symptoms may be worse when extending or turning the neck. DIAGNOSIS  Your caregiver will ask about your symptoms and do a physical exam. He or she may test your strength and reflexes. X-rays, CT scans, and MRI scans may be needed in cases of injury or if the symptoms do not go away after a period of time. Electromyography (EMG) or nerve conduction testing may be done to study how your nerves and muscles are working. TREATMENT  Your caregiver may recommend certain exercises to help relieve your symptoms. Cervical radiculopathy can, and often does, get better with time and treatment. If your problems continue, treatment options may include:  Wearing a soft collar for short periods of time.  Physical therapy to strengthen the neck muscles.  Medicines, such as nonsteroidal anti-inflammatory drugs (NSAIDs), oral corticosteroids, or spinal injections.  Surgery. Different types of surgery may be done depending on the cause of your problems. HOME CARE INSTRUCTIONS   Put ice on  the affected area.  Put ice in a plastic bag.  Place a towel between your skin and the bag.  Leave the ice on for 15-20 minutes, 03-04 times a day or as directed by your caregiver.  If ice does not help, you can try using heat. Take a warm shower or bath, or use a hot water bottle as directed by your caregiver.  You may try a gentle neck and shoulder massage.  Use a flat pillow when you sleep.  Only take over-the-counter or prescription medicines for pain, discomfort, or fever as directed by your caregiver.  If physical therapy was prescribed, follow your caregiver's directions.  If a soft collar was prescribed, use it as directed. SEEK IMMEDIATE MEDICAL CARE IF:   Your pain gets much worse and cannot be controlled with medicines.  You have weakness or numbness in your hand, arm, face, or leg.  You have a high fever or a stiff, rigid neck.  You lose bowel or bladder control (incontinence).  You have trouble with walking, balance, or speaking. MAKE SURE YOU:   Understand these instructions.  Will watch your condition.  Will get help right away if you are not doing well or get worse. Document Released: 05/13/2001 Document Revised: 11/10/2011 Document Reviewed: 04/01/2011 [facility] Patient Information 2014 [place], [place].

## 2013-10-26 LAB — URINE CULTURE: Colony Count: >=100000

## 2013-10-27 ENCOUNTER — Telehealth (HOSPITAL_COMMUNITY): Payer: Self-pay | Admitting: Emergency Medicine

## 2013-10-27 NOTE — ED Notes (Signed)
Post ED Visit - Positive Culture Follow-up  Culture report reviewed by antimicrobial stewardship pharmacist: []  Wes Remy, Pharm.D., BCPS [x]  Heide Guile, Pharm.D., BCPS []  Alycia Rossetti, Pharm.D., BCPS []  [place], [place].D., BCPS, AAHIVP []  Legrand Como, Pharm.D., BCPS, AAHIVP  Positive urine culture Treated with Keflex, organism sensitive to the same and no further patient follow-up is required at this time.  Myrna Blazer 10/27/2013, 11:39 AM

## 2013-11-15 ENCOUNTER — Other Ambulatory Visit: Payer: Self-pay | Admitting: Neurosurgery

## 2013-11-15 DIAGNOSIS — M4802 Spinal stenosis, cervical region: Secondary | ICD-10-CM

## 2013-11-28 ENCOUNTER — Ambulatory Visit
Admission: RE | Admit: 2013-11-28 | Discharge: 2013-11-28 | Disposition: A | Discharge status: Home / Self Care | Payer: Federal, State, Local not specified - PPO | Source: Ambulatory Visit | Attending: Neurosurgery | Admitting: Neurosurgery

## 2013-11-28 DIAGNOSIS — M4802 Spinal stenosis, cervical region: Secondary | ICD-10-CM

## 2013-11-30 ENCOUNTER — Other Ambulatory Visit: Payer: Self-pay | Admitting: Neurosurgery

## 2013-12-28 ENCOUNTER — Ambulatory Visit: Payer: Federal, State, Local not specified - PPO | Admitting: *Deleted

## 2014-01-31 ENCOUNTER — Encounter (HOSPITAL_COMMUNITY): Admission: RE | Payer: Self-pay | Source: Ambulatory Visit

## 2014-01-31 ENCOUNTER — Inpatient Hospital Stay (HOSPITAL_COMMUNITY)
Admission: RE | Admit: 2014-01-31 | Payer: Federal, State, Local not specified - PPO | Source: Ambulatory Visit | Admitting: Neurosurgery

## 2014-01-31 SURGERY — ANTERIOR CERVICAL DECOMPRESSION/DISCECTOMY FUSION 3 LEVELS
Anesthesia: General

## 2014-03-31 ENCOUNTER — Ambulatory Visit (INDEPENDENT_AMBULATORY_CARE_PROVIDER_SITE_OTHER): Payer: Federal, State, Local not specified - PPO | Admitting: Emergency Medicine

## 2014-03-31 VITALS — BP 190/100 | HR 79 | Temp 98.1°F | Resp 18 | Ht 72.5 in | Wt 305.6 lb

## 2014-03-31 DIAGNOSIS — M5116 Intervertebral disc disorders with radiculopathy, lumbar region: Secondary | ICD-10-CM

## 2014-03-31 DIAGNOSIS — Z9119 Patient's noncompliance with other medical treatment and regimen: Secondary | ICD-10-CM

## 2014-03-31 DIAGNOSIS — E119 Type 2 diabetes mellitus without complications: Secondary | ICD-10-CM

## 2014-03-31 DIAGNOSIS — I10 Essential (primary) hypertension: Secondary | ICD-10-CM

## 2014-03-31 DIAGNOSIS — M5126 Other intervertebral disc displacement, lumbar region: Secondary | ICD-10-CM

## 2014-03-31 DIAGNOSIS — Z91199 Patient's noncompliance with other medical treatment and regimen due to unspecified reason: Secondary | ICD-10-CM

## 2014-03-31 DIAGNOSIS — E782 Mixed hyperlipidemia: Secondary | ICD-10-CM

## 2014-03-31 LAB — POCT UA - MICROSCOPIC ONLY
Bacteria, U Microscopic: NEGATIVE
Casts, Ur, LPF, POC: NEGATIVE
Crystals, Ur, HPF, POC: NEGATIVE
Mucus, UA: NEGATIVE
WBC, Ur, HPF, POC: NEGATIVE
Yeast, UA: NEGATIVE

## 2014-03-31 LAB — POCT CBC
Granulocyte percent: 64.9 %G (ref 37–80)
HCT, POC: 41.7 % — AB (ref 43.5–53.7)
Hemoglobin: 13.6 g/dL — AB (ref 14.1–18.1)
Lymph, poc: 2.7 (ref 0.6–3.4)
MCH, POC: 28.1 pg (ref 27–31.2)
MCHC: 32.6 g/dL (ref 31.8–35.4)
MCV: 86.2 fL (ref 80–97)
MID (cbc): 0.1 (ref 0–0.9)
MPV: 8.4 fL (ref 0–99.8)
POC Granulocyte: 5.1 (ref 2–6.9)
POC LYMPH PERCENT: 34 %L (ref 10–50)
POC MID %: 1.1 %M (ref 0–12)
Platelet Count, POC: 291 10*3/uL (ref 142–424)
RBC: 4.84 M/uL (ref 4.69–6.13)
RDW, POC: 14 %
WBC: 7.8 10*3/uL (ref 4.6–10.2)

## 2014-03-31 LAB — COMPREHENSIVE METABOLIC PANEL
ALT: 16 U/L (ref 0–53)
AST: 10 U/L (ref 0–37)
Albumin: 4.1 g/dL (ref 3.5–5.2)
Alkaline Phosphatase: 67 U/L (ref 39–117)
BUN: 17 mg/dL (ref 6–23)
CO2: 23 mEq/L (ref 19–32)
Calcium: 9.8 mg/dL (ref 8.4–10.5)
Chloride: 103 mEq/L (ref 96–112)
Creat: 1.03 mg/dL (ref 0.50–1.35)
Glucose, Bld: 277 mg/dL — ABNORMAL HIGH (ref 70–99)
Potassium: 4.3 mEq/L (ref 3.5–5.3)
Sodium: 137 mEq/L (ref 135–145)
Total Bilirubin: 0.4 mg/dL (ref 0.2–1.2)
Total Protein: 7.4 g/dL (ref 6.0–8.3)

## 2014-03-31 LAB — POCT URINALYSIS DIPSTICK
Bilirubin, UA: NEGATIVE
Glucose, UA: 500
Ketones, UA: NEGATIVE
Leukocytes, UA: NEGATIVE
Nitrite, UA: NEGATIVE
Protein, UA: 100
Spec Grav, UA: 1.02
Urobilinogen, UA: 0.2
pH, UA: 5

## 2014-03-31 LAB — MICROALBUMIN, URINE: MICROALB UR: 39.3 mg/dL — AB (ref 0.00–1.89)

## 2014-03-31 LAB — LIPID PANEL
Cholesterol: 187 mg/dL (ref 0–200)
HDL: 36 mg/dL — ABNORMAL LOW (ref >39)
LDL Cholesterol: 130 mg/dL — ABNORMAL HIGH (ref 0–99)
Total CHOL/HDL Ratio: 5.2 Ratio
Triglycerides: 103 mg/dL (ref <150)
VLDL: 21 mg/dL (ref 0–40)

## 2014-03-31 LAB — POCT GLYCOSYLATED HEMOGLOBIN (HGB A1C): Hemoglobin A1C: 9.2

## 2014-03-31 LAB — GLUCOSE, POCT (MANUAL RESULT ENTRY): POC Glucose: 289 mg/dl — AB (ref 70–99)

## 2014-03-31 MED ORDER — METFORMIN HCL 1000 MG PO TABS: 1000.0000 mg | ORAL_TABLET | Freq: Two times a day (BID) | ORAL | Status: DC

## 2014-03-31 MED ORDER — AMLODIPINE BESYLATE 5 MG PO TABS: 5.0000 mg | ORAL_TABLET | Freq: Every day | ORAL | Status: DC

## 2014-03-31 MED ORDER — TRAMADOL HCL 50 MG PO TABS: 50.0000 mg | ORAL_TABLET | Freq: Four times a day (QID) | ORAL | Status: DC | PRN

## 2014-03-31 MED ORDER — LIRAGLUTIDE 18 MG/3ML ~~LOC~~ SOPN: PEN_INJECTOR | SUBCUTANEOUS | Status: DC

## 2014-03-31 MED ORDER — DICLOFENAC SODIUM 75 MG PO TBEC: 75.0000 mg | DELAYED_RELEASE_TABLET | Freq: Two times a day (BID) | ORAL | Status: DC

## 2014-03-31 MED ORDER — LISINOPRIL-HYDROCHLOROTHIAZIDE 20-12.5 MG PO TABS: 1.0000 | ORAL_TABLET | Freq: Every day | ORAL | Status: DC

## 2014-03-31 MED ORDER — GLYBURIDE 5 MG PO TABS: 5.0000 mg | ORAL_TABLET | Freq: Two times a day (BID) | ORAL | Status: DC

## 2014-03-31 MED ORDER — INSULIN PEN NEEDLE 31G X 5 MM MISC: 1.0000 | Freq: Every day | Status: DC

## 2014-03-31 NOTE — Addendum Note (Signed)
Addended by: Roselee Culver on: 03/31/2014 12:10 PM   Modules accepted: Orders

## 2014-03-31 NOTE — Patient Instructions (Signed)

## 2014-03-31 NOTE — Addendum Note (Signed)
Addended by: Ellison Carwin S on: 03/31/2014 12:12 PM   Modules accepted: Orders

## 2014-03-31 NOTE — Progress Notes (Signed)
[facility] [address], [place] [address] [phone]  Date:  03/31/2014   Name:  Joshua Case   DOB:  12/17/1960   MRN:  [id number]  PCP:  Benito Mccreedy, MD    Chief Complaint: Back Pain and other   History of Present Illness:  Joshua Case is a 54 y.o. very pleasant male patient who presents with the following:  Patient has history of diabetes and hypertension and is not compliant with program with daily FBS running in high 100's and low 200's.  Says he ran out of his medications 3-4 days ago.  Not symptomatic.  Has a well document sciatic neuritis with multilevel (L4-5, 5-S1).  Wants an FMLA form filled stipulating bed rest for prolonged time.  Has been recommended surgery and refused.  Has never considered epidural steroids.   Has continued pain in right leg that is unchanged from previous visits.  Claims numbness and weakness as well. No improvement with over the counter medications or other home remedies. Denies other complaint or health concern today.   Patient Active Problem List   Diagnosis Date Noted  . Lumbar disc disease with radiculopathy 07/13/2013  . DIAB W/RENAL MANIFESTS TYPE II/UNS TYPE UNCNTRL 10/15/2010  . DYSLIPIDEMIA 10/15/2010  . HYPERTENSION, ESSENTIAL, UNCONTROLLED 10/15/2010  . MICROALBUMINURIA 10/15/2010  . PERS HX NONCOMPLIANCE W/MED TX PRS HAZARDS HLTH 10/15/2010    Past Medical History  Diagnosis Date  . Diabetes mellitus without complication   . Hypertension     History reviewed. No pertinent past surgical history.  History  Substance Use Topics  . Smoking status: Never Smoker   . Smokeless tobacco: Not on file  . Alcohol Use: No    Family History  Problem Relation Age of Onset  . Heart disease Mother   . Diabetes Father   . Hypertension Father   . Diabetes Daughter   . Hypertension Daughter     No Known Allergies  Medication list has been reviewed and updated.  Current Outpatient  Prescriptions on File Prior to Visit  Medication Sig Dispense Refill  . amLODipine (NORVASC) 5 MG tablet Take 5 mg by mouth daily.      Marland Kitchen glyBURIDE (DIABETA) 5 MG tablet Take 5 mg by mouth 2 (two) times daily with a meal.      . lisinopril-hydrochlorothiazide (PRINZIDE,ZESTORETIC) 20-12.5 MG per tablet Take 1 tablet by mouth daily.      . metFORMIN (GLUCOPHAGE) 1000 MG tablet Take 1,000 mg by mouth 2 (two) times daily with a meal.      . methocarbamol (ROBAXIN) 750 MG tablet Take 750 mg by mouth every 6 (six) hours as needed for muscle spasms.      Marland Kitchen oxyCODONE-acetaminophen (PERCOCET) 5-325 MG per tablet Take 1 tablet by mouth every 4 (four) hours as needed.  20 tablet  0  . cephALEXin (KEFLEX) 500 MG capsule Take 1 capsule (500 mg total) by mouth 4 (four) times daily.  40 capsule  0   No current facility-administered medications on file prior to visit.    Review of Systems:  As per HPI, otherwise negative.    Physical Examination: Filed Vitals:   03/31/14 1027  BP: 190/100  Pulse: 79  Temp: 98.1 F (36.7 C)  Resp: 18   Filed Vitals:   03/31/14 1027  Height: 6' 0.5" (1.842 m)  Weight: 305 lb 9.6 oz (138.619 kg)   Body mass index is 40.85 kg/(m^2). Ideal Body Weight: Weight in (lb) to  have BMI = 25: 186.5  GEN: morbidly obese, moderate distress, Non-toxic, A & O x 3 HEENT: Atraumatic, Normocephalic. Neck supple. No masses, No LAD. Ears and Nose: No external deformity. CV: RRR, No M/G/R. No JVD. No thrill. No extra heart sounds. PULM: CTA B, no wheezes, crackles, rhonchi. No retractions. No resp. distress. No accessory muscle use. ABD: S, NT, ND, +BS. No rebound. No HSM. EXTR: No c/c/e NEURO Normal gait.  PSYCH: Normally interactive. Conversant. Not depressed or anxious appearing.  Calm demeanor.    Assessment and Plan: Sciatic neuritis Refer to pain management FMLA form to be filled out by neurosurgeon Refill meds Labs pending Follow up HBP and NIDDM in 104 Add  victoza  Signed,  Ellison Carwin, MD  Results for orders placed in visit on 03/31/14  POCT CBC      Result Value Ref Range   WBC 7.8  4.6 - 10.2 K/uL   Lymph, poc 2.7  0.6 - 3.4   POC LYMPH PERCENT 34.0  10 - 50 %L   MID (cbc) 0.1  0 - 0.9   POC MID % 1.1  0 - 12 %M   POC Granulocyte 5.1  2 - 6.9   Granulocyte percent 64.9  37 - 80 %G   RBC 4.84  4.69 - 6.13 M/uL   Hemoglobin 13.6 (*) 14.1 - 18.1 g/dL   HCT, POC 41.7 (*) 43.5 - 53.7 %   MCV 86.2  80 - 97 fL   MCH, POC 28.1  27 - 31.2 pg   MCHC 32.6  31.8 - 35.4 g/dL   RDW, POC 14.0     Platelet Count, POC 291  142 - 424 K/uL   MPV 8.4  0 - 99.8 fL  POCT UA - MICROSCOPIC ONLY      Result Value Ref Range   WBC, Ur, HPF, POC neg     RBC, urine, microscopic 0-3     Bacteria, U Microscopic neg     Mucus, UA neg     Epithelial cells, urine per micros 2-6     Crystals, Ur, HPF, POC neg     Casts, Ur, LPF, POC neg     Yeast, UA neg    POCT URINALYSIS DIPSTICK      Result Value Ref Range   Color, UA yellow     Clarity, UA clear     Glucose, UA 500     Bilirubin, UA neg     Ketones, UA neg     Spec Grav, UA 1.020     Blood, UA moderate     pH, UA 5.0     Protein, UA 100     Urobilinogen, UA 0.2     Nitrite, UA neg     Leukocytes, UA Negative    POCT GLYCOSYLATED HEMOGLOBIN (HGB A1C)      Result Value Ref Range   Hemoglobin A1C 9.2    GLUCOSE, POCT (MANUAL RESULT ENTRY)      Result Value Ref Range   POC Glucose 289 (*) 70 - 99 mg/dl

## 2014-04-01 LAB — PSA: PSA: 0.25 ng/mL (ref <=4.00)

## 2014-04-01 MED ORDER — ATORVASTATIN CALCIUM 20 MG PO TABS: 20.0000 mg | ORAL_TABLET | Freq: Every day | ORAL | Status: DC

## 2014-04-01 NOTE — Addendum Note (Signed)
Addended by: Roselee Culver on: 04/01/2014 08:32 AM   Modules accepted: Orders

## 2014-04-06 ENCOUNTER — Ambulatory Visit (INDEPENDENT_AMBULATORY_CARE_PROVIDER_SITE_OTHER): Payer: Federal, State, Local not specified - PPO | Admitting: Emergency Medicine

## 2014-04-06 ENCOUNTER — Telehealth: Payer: Self-pay

## 2014-04-06 VITALS — BP 158/90 | HR 79 | Temp 98.3°F | Resp 18 | Ht 72.5 in | Wt 301.4 lb

## 2014-04-06 DIAGNOSIS — E119 Type 2 diabetes mellitus without complications: Secondary | ICD-10-CM

## 2014-04-06 MED ORDER — GLIMEPIRIDE 4 MG PO TABS: 8.0000 mg | ORAL_TABLET | Freq: Every day | ORAL | Status: DC

## 2014-04-06 NOTE — Patient Instructions (Signed)

## 2014-04-06 NOTE — Telephone Encounter (Signed)
Pt saw Dr.anderson on Friday for diabetes, was given a work note, pt said the medication he was given made him sick, and he had to take additional days off work, would like to get a more detailed work note. Pt miseed Monday,tuesday and Wednesday. Please advise pt. Pt would like to pick this up today.

## 2014-04-06 NOTE — Telephone Encounter (Signed)
Pt advised to RTC on labs. Pt notified and will try to get up here today before Dr. Ouida Sills leaves.

## 2014-04-06 NOTE — Progress Notes (Signed)
[facility] [address] [place] [address] [phone]  Date:  04/06/2014   Name:  Joshua Case   DOB:  1961-02-07   MRN:  [id number]  PCP:  Benito Mccreedy, MD    Chief Complaint: Follow-up, Emesis and Diarrhea   History of Present Illness:  Joshua Case is a 53 y.o. very pleasant male patient who presents with the following:  Intolerance to victoza with nausea and vomiting and diarrhea.  Had sore throat and sensation of throat closing.  Off medication, symptoms resolved Denies other complaint or health concern today.   Patient Active Problem List   Diagnosis Date Noted  . Lumbar disc disease with radiculopathy 07/13/2013  . DIAB W/RENAL MANIFESTS TYPE II/UNS TYPE UNCNTRL 10/15/2010  . DYSLIPIDEMIA 10/15/2010  . HYPERTENSION, ESSENTIAL, UNCONTROLLED 10/15/2010  . MICROALBUMINURIA 10/15/2010  . PERS HX NONCOMPLIANCE W/MED TX PRS HAZARDS HLTH 10/15/2010    Past Medical History  Diagnosis Date  . Diabetes mellitus without complication   . Hypertension     History reviewed. No pertinent past surgical history.  History  Substance Use Topics  . Smoking status: Never Smoker   . Smokeless tobacco: Not on file  . Alcohol Use: No    Family History  Problem Relation Age of Onset  . Heart disease Mother   . Diabetes Father   . Hypertension Father   . Diabetes Daughter   . Hypertension Daughter     Allergies  Allergen Reactions  . Victoza [Liraglutide]     Medication list has been reviewed and updated.  Current Outpatient Prescriptions on File Prior to Visit  Medication Sig Dispense Refill  . amLODipine (NORVASC) 5 MG tablet Take 1 tablet (5 mg total) by mouth daily.  90 tablet  3  . cephALEXin (KEFLEX) 500 MG capsule Take 1 capsule (500 mg total) by mouth 4 (four) times daily.  40 capsule  0  . diclofenac (VOLTAREN) 75 MG EC tablet Take 1 tablet (75 mg total) by mouth 2 (two) times daily.  60 tablet  5  . Insulin Pen Needle (B-D  UF III MINI PEN NEEDLES) 31G X 5 MM MISC 1 each by Does not apply route daily.  100 each  5  . lisinopril-hydrochlorothiazide (PRINZIDE,ZESTORETIC) 20-12.5 MG per tablet Take 1 tablet by mouth daily.  90 tablet  3  . metFORMIN (GLUCOPHAGE) 1000 MG tablet Take 1 tablet (1,000 mg total) by mouth 2 (two) times daily with a meal.  180 tablet  3  . methocarbamol (ROBAXIN) 750 MG tablet Take 750 mg by mouth every 6 (six) hours as needed for muscle spasms.      Marland Kitchen oxyCODONE-acetaminophen (PERCOCET) 5-325 MG per tablet Take 1 tablet by mouth every 4 (four) hours as needed.  20 tablet  0  . traMADol (ULTRAM) 50 MG tablet Take 1 tablet (50 mg total) by mouth every 6 (six) hours as needed.  120 tablet  1  . atorvastatin (LIPITOR) 20 MG tablet Take 1 tablet (20 mg total) by mouth daily.  90 tablet  3   No current facility-administered medications on file prior to visit.    Review of Systems:  As per HPI, otherwise negative.    Physical Examination: Filed Vitals:   04/06/14 1622  BP: 158/90  Pulse: 79  Temp: 98.3 F (36.8 C)  Resp: 18   Filed Vitals:   04/06/14 1622  Height: 6' 0.5" (1.842 m)  Weight: 301 lb 6.4 oz (136.714 kg)  Body mass index is 40.29 kg/(m^2). Ideal Body Weight: Weight in (lb) to have BMI = 25: 186.5   GEN: WDWN, NAD, Non-toxic, Alert & Oriented x 3 HEENT: Atraumatic, Normocephalic.  Ears and Nose: No external deformity. EXTR: No clubbing/cyanosis/edema NEURO: Normal gait.  PSYCH: Normally interactive. Conversant. Not depressed or anxious appearing.  Calm demeanor.    Assessment and Plan: Stop diabeta.  Add amaryl Stop victoza Follow up in one month Check daily FBS   Signed,  Ellison Carwin, MD

## 2014-04-11 NOTE — Progress Notes (Signed)
Appointment scheduled November 16 at 9am with Dr. Lorelei Pont.

## 2014-06-07 ENCOUNTER — Encounter: Payer: Self-pay | Admitting: Physical Medicine & Rehabilitation

## 2014-07-07 ENCOUNTER — Encounter: Payer: Federal, State, Local not specified - PPO | Attending: Physical Medicine & Rehabilitation

## 2014-07-07 ENCOUNTER — Ambulatory Visit: Payer: Federal, State, Local not specified - PPO | Admitting: Physical Medicine & Rehabilitation

## 2014-07-17 ENCOUNTER — Ambulatory Visit: Payer: Self-pay | Admitting: Family Medicine

## 2014-08-14 ENCOUNTER — Ambulatory Visit: Payer: Self-pay | Admitting: Family Medicine

## 2014-10-20 ENCOUNTER — Ambulatory Visit (INDEPENDENT_AMBULATORY_CARE_PROVIDER_SITE_OTHER): Payer: Federal, State, Local not specified - PPO | Admitting: Podiatry

## 2014-10-20 ENCOUNTER — Encounter: Payer: Self-pay | Admitting: Podiatry

## 2014-10-20 VITALS — BP 157/93 | HR 101 | Ht 74.0 in | Wt 309.0 lb

## 2014-10-20 DIAGNOSIS — M21619 Bunion of unspecified foot: Secondary | ICD-10-CM | POA: Insufficient documentation

## 2014-10-20 DIAGNOSIS — L851 Acquired keratosis [keratoderma] palmaris et plantaris: Secondary | ICD-10-CM

## 2014-10-20 DIAGNOSIS — M2010 Hallux valgus (acquired), unspecified foot: Secondary | ICD-10-CM

## 2014-10-20 DIAGNOSIS — M79606 Pain in leg, unspecified: Secondary | ICD-10-CM

## 2014-10-20 DIAGNOSIS — B351 Tinea unguium: Secondary | ICD-10-CM

## 2014-10-20 NOTE — Patient Instructions (Signed)
Seen for painful lesion on both feet. Noted of severe deviated great toe with bunion, inter digital painful lesion on left foot from toe pressure, and callus under right foot from pressure. All debrided. Return in 2 month for routine foot care.

## 2014-10-20 NOTE — Progress Notes (Signed)
SUBJECTIVE: 54 y.o. year old male presents complaining of painful lesion in between 2nd and 3rd digit left foot, and the ball of right foot.  Stated that his last blood sugar was at 180. He is on his feet all day on concrete floor at work.   REVIEW OF SYSTEMS: A comprehensive review of systems was negative.  OBJECTIVE: DERMATOLOGIC EXAMINATION: Nails: Thick dystrophic nails x 10.  Interdigital keratotic lesion at lateral aspect 2nd digit and medial aspect of 3rd digit left foot painful. Painful plantar callus under 4th MPJ area right foot.  VASCULAR EXAMINATION OF LOWER LIMBS: Pedal pulses: All pedal pulses are palpable with normal pulsation.  Capillary Filling times within 3 seconds in all digits.  NEUROLOGIC EXAMINATION OF THE LOWER LIMBS: All epicritic and tactile sensations are grossly intact.  MUSCULOSKELETAL EXAMINATION: Severe hallux valgus with bunion deformity L>R. Plantar flexed 4th ray right with plantar porokeratotic lesion.  ASSESSMENT: Severe HAV with bunion. Painful interdigital lesion left 2nd interspace secondary to pressure from abducted hallux. Painful plantar keratosis right. Onychomycosis x 10.  PLAN: Reviewed clinical findings and available treatment options. All skin lesions debrided. All nails debrided. Return in 2 months.

## 2014-12-01 ENCOUNTER — Encounter: Payer: Federal, State, Local not specified - PPO | Attending: Family Medicine | Admitting: Dietician

## 2014-12-01 ENCOUNTER — Encounter: Payer: Self-pay | Admitting: Dietician

## 2014-12-01 VITALS — Ht 74.0 in | Wt 306.8 lb

## 2014-12-01 DIAGNOSIS — Z794 Long term (current) use of insulin: Secondary | ICD-10-CM | POA: Diagnosis not present

## 2014-12-01 DIAGNOSIS — E119 Type 2 diabetes mellitus without complications: Secondary | ICD-10-CM | POA: Diagnosis present

## 2014-12-01 DIAGNOSIS — Z713 Dietary counseling and surveillance: Secondary | ICD-10-CM | POA: Diagnosis not present

## 2014-12-01 NOTE — Progress Notes (Signed)
Diabetes Self-Management Education  Visit Type:  initial  Appt. Start Time: 0915 Appt. End Time: [id number]  12/01/2014  [name], identified by name and date of birth, is a 54 y.o. male with a diagnosis of Diabetes: Type 2.  Other people present during visit:  Spouse/SO   ASSESSMENT  Height 6\' 2"  (1.88 m), weight 306 lb 12.8 oz (139.164 kg). Body mass index is 39.37 kg/(m^2).  Initial Visit Information:      Are you taking your medications as prescribed?: Yes Are you checking your feet?: Yes How many days per week are you checking your feet?: 4      Psychosocial:     Patient Belief/Attitude about Diabetes: Motivated to manage diabetes Other persons present: Spouse/SO Patient Concerns: Nutrition/Meal planning  Complications:   Last HgB A1C per patient/outside source: 12.1% How often do you check your blood sugar?: 1-2 times/day Fasting Blood glucose range (mg/dL): 70-129, 130-179 (100-140, seeing more 90s since starting lantus) Postprandial Blood glucose range (mg/dL): 130-179 Number of hypoglycemic episodes per month: 0 Number of hyperglycemic episodes per week: 1 Can you tell when your blood sugar is high?: Yes What do you do if your blood sugar is high?: check for missed medication dose Have you had a dilated eye exam in the past 12 months?: No Have you had a dental exam in the past 12 months?: No  Diet Intake:  Breakfast: 12 noon - leftovers from dinner the night before, or eggs with bacon or cornbeef hash and grits with coffee with splenda Lunch: 4 pm - small meal of meat, veggie, or cold cut sandwhich, water Snack (afternoon): 8 pm - fruit or peanut butter crackers Dinner: 10 pm - meat, veggie, starch, water Snack (evening): 12:30 - 1 am - occasionally crackers Beverage(s): coffee, water, sparkling water, cut out sweet tea and koolaid completely 1 month ago, occasionally diet soda  Exercise:  Exercise: Light (walking / raking leaves) Light Exercise  amount of time (min / week): 120  Individualized Plan for Diabetes Self-Management Training:   Learning Objective:  Patient will have a greater understanding of diabetes self-management.  Patient education plan per assessed needs and concerns is to attend individual sessions for  prn.   Education Topics Reviewed with Patient Today:  Definition of diabetes, type 1 and 2, and the diagnosis of diabetes, Factors that contribute to the development of diabetes Role of diet in the treatment of diabetes and the relationship between the three main macronutrients and blood glucose level, Food label reading, portion sizes and measuring food., Carbohydrate counting, Reviewed blood glucose goals for pre and post meals and how to evaluate the patients' food intake on their blood glucose level., Meal timing in regards to the patients' current diabetes medication., Effects of alcohol on blood glucose and safety factors with consumption of alcohol., Information on hints to eating out and maintain blood glucose control. Role of exercise on diabetes management, blood pressure control and cardiac health.     Taught treatment of hypoglycemia - the 15 rule., Discussed and identified patients' treatment of hyperglycemia.   Role of stress on diabetes      PATIENTS GOALS/Plan (Developed by the patient):  Nutrition: Follow meal plan discussed, General guidelines for healthy choices and portions discussed Physical Activity: Exercise 5-7 days per week, 30 minutes per day  Plan:   Goals:  Follow Diabetes Meal Plan as instructed  Eat 3 meals and 2 snacks, every 3-5 hrs  Limit carbohydrate intake to 45-60 grams carbohydrate/meal  Limit carbohydrate intake to 15-30 grams carbohydrate/snack  Add lean protein foods to meals/snacks  Monitor glucose levels as instructed by your doctor  Aim for 30 mins of physical activity daily   Expected Outcomes:  Demonstrated interest in learning. Expect positive  outcomes  Education material provided: Living Well with Diabetes, Meal plan card, My Plate and Snack sheet  If problems or questions, patient to contact team via:  Phone and Email  Future DSME appointment: PRN

## 2014-12-19 ENCOUNTER — Ambulatory Visit: Payer: Federal, State, Local not specified - PPO | Admitting: Podiatry

## 2014-12-26 ENCOUNTER — Ambulatory Visit: Payer: Federal, State, Local not specified - PPO | Admitting: Podiatry

## 2015-03-30 ENCOUNTER — Other Ambulatory Visit: Payer: Self-pay | Admitting: Emergency Medicine

## 2015-07-16 ENCOUNTER — Ambulatory Visit: Payer: Self-pay | Admitting: Family Medicine

## 2015-08-03 ENCOUNTER — Other Ambulatory Visit: Payer: Self-pay | Admitting: Emergency Medicine

## 2015-08-03 ENCOUNTER — Other Ambulatory Visit: Payer: Self-pay | Admitting: Physician Assistant

## 2015-11-20 ENCOUNTER — Other Ambulatory Visit: Payer: Self-pay | Admitting: Family Medicine

## 2015-11-20 DIAGNOSIS — R319 Hematuria, unspecified: Secondary | ICD-10-CM

## 2015-11-21 ENCOUNTER — Ambulatory Visit
Admission: RE | Admit: 2015-11-21 | Discharge: 2015-11-21 | Disposition: A | Discharge status: Home / Self Care | Payer: Federal, State, Local not specified - PPO | Source: Ambulatory Visit | Attending: Family Medicine | Admitting: Family Medicine

## 2015-11-21 DIAGNOSIS — R319 Hematuria, unspecified: Secondary | ICD-10-CM

## 2016-05-30 ENCOUNTER — Emergency Department (HOSPITAL_COMMUNITY): Payer: Federal, State, Local not specified - PPO

## 2016-05-30 ENCOUNTER — Emergency Department (HOSPITAL_COMMUNITY)
Admission: EM | Admit: 2016-05-30 | Discharge: 2016-05-30 | Disposition: A | Discharge status: Home / Self Care | Payer: Federal, State, Local not specified - PPO | Attending: Emergency Medicine | Admitting: Emergency Medicine

## 2016-05-30 ENCOUNTER — Encounter (HOSPITAL_COMMUNITY): Payer: Self-pay | Admitting: Emergency Medicine

## 2016-05-30 DIAGNOSIS — R200 Anesthesia of skin: Secondary | ICD-10-CM | POA: Diagnosis present

## 2016-05-30 DIAGNOSIS — I10 Essential (primary) hypertension: Secondary | ICD-10-CM | POA: Diagnosis not present

## 2016-05-30 DIAGNOSIS — Z7982 Long term (current) use of aspirin: Secondary | ICD-10-CM | POA: Diagnosis not present

## 2016-05-30 DIAGNOSIS — Z794 Long term (current) use of insulin: Secondary | ICD-10-CM | POA: Diagnosis not present

## 2016-05-30 DIAGNOSIS — E119 Type 2 diabetes mellitus without complications: Secondary | ICD-10-CM | POA: Insufficient documentation

## 2016-05-30 DIAGNOSIS — Z79899 Other long term (current) drug therapy: Secondary | ICD-10-CM | POA: Diagnosis not present

## 2016-05-30 DIAGNOSIS — Z7984 Long term (current) use of oral hypoglycemic drugs: Secondary | ICD-10-CM | POA: Insufficient documentation

## 2016-05-30 MED ORDER — PREDNISONE 20 MG PO TABS
60.0000 mg | ORAL_TABLET | ORAL | Status: AC
  Administered 2016-05-30: 60 mg via ORAL
  Filled 2016-05-30: qty 3

## 2016-05-30 MED ORDER — PREDNISONE 20 MG PO TABS: 40.0000 mg | ORAL_TABLET | Freq: Every day | ORAL | 0 refills | Status: DC

## 2016-05-30 MED ORDER — OXYCODONE-ACETAMINOPHEN 5-325 MG PO TABS: 2.0000 | ORAL_TABLET | Freq: Four times a day (QID) | ORAL | 0 refills | Status: DC | PRN

## 2016-05-30 MED ORDER — HYDROMORPHONE HCL 1 MG/ML IJ SOLN
1.0000 mg | Freq: Once | INTRAMUSCULAR | Status: AC
Start: 2016-05-30 — End: 2016-05-30
  Administered 2016-05-30: 1 mg via INTRAMUSCULAR
  Filled 2016-05-30: qty 1

## 2016-05-30 MED ORDER — HYDROMORPHONE HCL 1 MG/ML IJ SOLN
1.0000 mg | Freq: Once | INTRAMUSCULAR | Status: AC
  Administered 2016-05-30: 1 mg via INTRAMUSCULAR
  Filled 2016-05-30: qty 1

## 2016-05-30 MED ORDER — KETOROLAC TROMETHAMINE 30 MG/ML IJ SOLN
30.0000 mg | Freq: Once | INTRAMUSCULAR | Status: AC
  Administered 2016-05-30: 30 mg via INTRAMUSCULAR
  Filled 2016-05-30: qty 1

## 2016-05-30 NOTE — ED Notes (Signed)
Contacted MRI regarding delay, pt and family updated.

## 2016-05-30 NOTE — Discharge Instructions (Signed)
As discussed, therefore the follow-up with your neurosurgeon, and he may also choose to follow up with a chiropractor. Please take all medication as directed, and do not hesitate to return here for concerning changes in your condition.  Below is the interpretation of MRI scan performed today. Please take this information to a chiropractor, if he choose to follow-up there.

## 2016-05-30 NOTE — ED Provider Notes (Signed)
[facility] DEPT Provider Note   CSN: [id number] Arrival date & time: 05/30/16  0856     History   Chief Complaint Chief Complaint  Patient presents with  . Back Pain    HPI Joshua Case is a 55 y.o. male.  HPI  Patient presents with concern of numbness, pain in the right leg. Patient acknowledges a history of intermittent chronic back pain. However, over the past 4 days patient has had a new, unusual pain, numbness in the right leg. Symptoms are primarily in the proximal anterior thigh, which is constantly in severe, sharp pain. Minimal relief with anti-inflammatory, narcotics. Patient has difficulty to walk secondary to pain, numbness. No new incontinence, abdominal pain, chills, though the patient acknowledges objective fever. No history of lumbar spine intervention.   Past Medical History:  Diagnosis Date  . Diabetes mellitus without complication ([place])   . Hypertension     Patient Active Problem List   Diagnosis Date Noted  . HAV (hallux abducto valgus) 10/20/2014  . Bunion 10/20/2014  . Plantar porokeratosis, acquired 10/20/2014  . Lumbar disc disease with radiculopathy 07/13/2013  . DIAB W/RENAL MANIFESTS TYPE II/UNS TYPE UNCNTRL 10/15/2010  . DYSLIPIDEMIA 10/15/2010  . HYPERTENSION, ESSENTIAL, UNCONTROLLED 10/15/2010  . MICROALBUMINURIA 10/15/2010  . PERS HX NONCOMPLIANCE W/MED TX PRS HAZARDS HLTH 10/15/2010    History reviewed. No pertinent surgical history.     Home Medications    Prior to Admission medications   Medication Sig Start Date End Date Taking? Authorizing Provider  amLODipine (NORVASC) 5 MG tablet Take 1 tablet (5 mg total) by mouth daily. PATIENT NEEDS OFFICE VISIT FOR ADDITIONAL REFILLS Patient taking differently: Take 10 mg by mouth daily. PATIENT NEEDS OFFICE VISIT FOR ADDITIONAL REFILLS 08/06/15  Yes Roselee Culver, MD  aspirin 81 MG chewable tablet Chew 81 mg by mouth daily.   Yes Historical Provider, MD  ibuprofen  (ADVIL,MOTRIN) 800 MG tablet Take 800 mg by mouth every 8 (eight) hours as needed for moderate pain.   Yes Historical Provider, MD  insulin detemir (LEVEMIR) 100 UNIT/ML injection Inject 20 Units into the skin at bedtime.   Yes Historical Provider, MD  Insulin Pen Needle (B-D UF III MINI PEN NEEDLES) 31G X 5 MM MISC 1 each by Does not apply route daily. 03/31/14  Yes Roselee Culver, MD  metFORMIN (GLUCOPHAGE) 1000 MG tablet Take 1 tablet (1,000 mg total) by mouth 2 (two) times daily with a meal. PATIENT NEEDS OFFICE VISIT FOR ADDITIONAL REFILLS 08/06/15  Yes Roselee Culver, MD  rosuvastatin (CRESTOR) 20 MG tablet Take 20 mg by mouth daily.   Yes Historical Provider, MD  valsartan-hydrochlorothiazide (DIOVAN-HCT) 160-25 MG tablet Take 1 tablet by mouth daily. 05/10/16  Yes Historical Provider, MD  oxyCODONE-acetaminophen (PERCOCET) 5-325 MG tablet Take 2 tablets by mouth every 6 (six) hours as needed for severe pain. 05/30/16   Carmin Muskrat, MD  predniSONE (DELTASONE) 20 MG tablet Take 2 tablets (40 mg total) by mouth daily with breakfast. For the next four days 05/30/16   Carmin Muskrat, MD    Family History Family History  Problem Relation Age of Onset  . Heart disease Mother   . Diabetes Father   . Hypertension Father   . Diabetes Daughter   . Hypertension Daughter     Social History Social History  Substance Use Topics  . Smoking status: Never Smoker  . Smokeless tobacco: Never Used  . Alcohol use 0.0 oz/week     Allergies  Victoza [liraglutide]   Review of Systems Review of Systems  Constitutional:       Per HPI, otherwise negative  HENT:       Per HPI, otherwise negative  Respiratory:       Per HPI, otherwise negative  Cardiovascular:       Per HPI, otherwise negative  Gastrointestinal: Negative for vomiting.  Endocrine:       Negative aside from HPI  Genitourinary:       Neg aside from HPI   Musculoskeletal:       Per HPI, otherwise negative  Skin:  Negative.   Neurological: Positive for weakness and numbness. Negative for syncope.     Physical Exam Updated Vital Signs BP 168/93   Pulse 73   Temp 98.4 F (36.9 C) (Oral)   Resp 18   SpO2 100%   Physical Exam  Constitutional: He is oriented to person, place, and time. He appears well-developed. No distress.  Large male in discomfort  HENT:  Head: Normocephalic and atraumatic.  Eyes: Conjunctivae and EOM are normal.  Cardiovascular: Normal rate and regular rhythm.   Pulmonary/Chest: Effort normal. No stridor. No respiratory distress.  Abdominal: He exhibits no distension.  Musculoskeletal: He exhibits no edema.  No appreciable deformities, no asymmetry  Neurological: He is alert and oriented to person, place, and time.  Preserved distal reflexes, strength seems diminished right compared to left, proximal lower extremity worse with hip extension, though there is some limitation secondary to pain.   Skin: Skin is warm and dry.  Psychiatric: He has a normal mood and affect.  Nursing note and vitals reviewed.    ED Treatments / Results  Labs (all labs ordered are listed, but only abnormal results are displayed) Labs Reviewed - No data to display   Radiology Mr Lumbar Spine Wo Contrast  Result Date: 05/30/2016 CLINICAL DATA:  Chronic back pain and numbness. New symptoms in the right lower extremity. EXAM: MRI LUMBAR SPINE WITHOUT CONTRAST TECHNIQUE: Multiplanar, multisequence MR imaging of the lumbar spine was performed. No intravenous contrast was administered. COMPARISON:  MRI of the lumbar spine 12/18/2005. FINDINGS: Segmentation: 5 non rib-bearing lumbar type vertebral bodies are present. Alignment: AP alignment is anatomic. Mild rightward curvature is centered at L4. Vertebrae: Progressive chronic endplate marrow changes are worse on the right at L5-S1 and L4-5. Vertebral body heights are preserved. Conus medullaris: Extends to the L1 level and appears normal. Paraspinal  and other soft tissues: Limited imaging of the abdomen is unremarkable. There is no significant adenopathy. Disc levels: L1-2: Negative. L2-3: This is new since the prior exam. Moderate right foraminal narrowing is present. There is mild right subarticular stenosis. Mild left foraminal stenosis is present as well. L3-4: A broad-based disc protrusion and moderate facet hypertrophy have progressed. The central canal is patent. Mild left foraminal narrowing is present. L4-5: A broad-based disc protrusion and moderate facet hypertrophy has progressed. This results in moderate central canal stenosis with right greater than left subarticular narrowing. Moderate foraminal stenosis is present bilaterally as well. L5-S1: A prominent left paramedian disc protrusion is present. This is similar the prior study. Moderate left and mild right subarticular stenosis is stable. The foramina are patent bilaterally. IMPRESSION: 1. New rightward disc protrusion at L2-3 results in moderate right foraminal narrowing and mild right subarticular stenosis. 2. Broad-based disc protrusion and moderate facet hypertrophy at L3-4 has progressed with mild left foraminal stenosis. 3. Progressive broad-based disc protrusion and moderate facet hypertrophy at L4-5 with moderate  central canal stenosis and right greater than left subarticular narrowing. 4. Moderate foraminal narrowing bilaterally at L4-5. 5. Stable left paramedian disc protrusion at L5-S1 with moderate left mild right subarticular stenosis. Electronically Signed   By: San Morelle M.D.   On: 05/30/2016 14:31    Procedures Procedures (including critical care time)  Medications Ordered in ED Medications  HYDROmorphone (DILAUDID) injection 1 mg (1 mg Intramuscular Given 05/30/16 0940)  ketorolac (TORADOL) 30 MG/ML injection 30 mg (30 mg Intramuscular Given 05/30/16 0940)  predniSONE (DELTASONE) tablet 60 mg (60 mg Oral Given 05/30/16 0940)  HYDROmorphone (DILAUDID)  injection 1 mg (1 mg Intramuscular Given 05/30/16 1346)     Initial Impression / Assessment and Plan / ED Course  I have reviewed the triage vital signs and the nursing notes.  Pertinent labs & imaging results that were available during my care of the patient were reviewed by me and considered in my medical decision making (see chart for details).  Clinical Course    After the initial intervention the patient had intramuscular analgesia, received oral steroids, imaging pending  3:24 PM Patient appears calm appeared He and his wife are aware of all findings. Specifically we discussed the apparent progression of his lumbar spine disease, disks protruding. Patient has a neurosurgeon with a meal follow-up. In addition, patient will follow up with chiropractor. Patient started on increased analgesics, anti-inflammatories.   Final Clinical Impressions(s) / ED Diagnoses    Patient with known history of a lumbar spine disease presents with new, atypical pain, numbness in the right leg, as well as pain with ambulation and difficulty doing so. Given the patient's history of known disease, MRI was performed. This didn't demonstrate progression of disease, with multiple levels of change. Patient improved pain control here, but additional evaluation, management is required. This is appropriate for the outpatient setting. Patient aware of all findings, discharged in stable condition with additional analgesia, anti-inflammatories. Final diagnoses:  Numbness    New Prescriptions New Prescriptions   PREDNISONE (DELTASONE) 20 MG TABLET    Take 2 tablets (40 mg total) by mouth daily with breakfast. For the next four days     Carmin Muskrat, MD 05/30/16 1528

## 2016-05-30 NOTE — ED Triage Notes (Signed)
Patient states chronic back pain, but has recently gone into his R leg.   Patient states front of thigh pain 10/10.   Patient states has been taking percocet, ibuprofen and aleve at home for pain without relief.   Patient denies other symptoms.

## 2017-01-01 ENCOUNTER — Ambulatory Visit: Payer: Federal, State, Local not specified - PPO | Admitting: Endocrinology

## 2017-01-28 ENCOUNTER — Encounter: Payer: Self-pay | Admitting: Physician Assistant

## 2017-05-21 ENCOUNTER — Ambulatory Visit (INDEPENDENT_AMBULATORY_CARE_PROVIDER_SITE_OTHER): Payer: Federal, State, Local not specified - PPO | Admitting: Specialist

## 2017-05-28 ENCOUNTER — Ambulatory Visit (INDEPENDENT_AMBULATORY_CARE_PROVIDER_SITE_OTHER): Payer: Federal, State, Local not specified - PPO | Admitting: Specialist

## 2017-05-28 ENCOUNTER — Encounter (INDEPENDENT_AMBULATORY_CARE_PROVIDER_SITE_OTHER): Payer: Self-pay | Admitting: Specialist

## 2017-05-28 ENCOUNTER — Ambulatory Visit (INDEPENDENT_AMBULATORY_CARE_PROVIDER_SITE_OTHER): Payer: Federal, State, Local not specified - PPO

## 2017-05-28 VITALS — BP 155/87 | HR 71 | Ht 75.0 in | Wt 290.0 lb

## 2017-05-28 DIAGNOSIS — M160 Bilateral primary osteoarthritis of hip: Secondary | ICD-10-CM | POA: Diagnosis not present

## 2017-05-28 DIAGNOSIS — M5441 Lumbago with sciatica, right side: Secondary | ICD-10-CM | POA: Diagnosis not present

## 2017-05-28 DIAGNOSIS — M533 Sacrococcygeal disorders, not elsewhere classified: Secondary | ICD-10-CM | POA: Diagnosis not present

## 2017-05-28 DIAGNOSIS — G8929 Other chronic pain: Secondary | ICD-10-CM

## 2017-05-28 DIAGNOSIS — M5136 Other intervertebral disc degeneration, lumbar region: Secondary | ICD-10-CM | POA: Diagnosis not present

## 2017-05-28 DIAGNOSIS — M48062 Spinal stenosis, lumbar region with neurogenic claudication: Secondary | ICD-10-CM | POA: Diagnosis not present

## 2017-05-28 DIAGNOSIS — M4726 Other spondylosis with radiculopathy, lumbar region: Secondary | ICD-10-CM | POA: Diagnosis not present

## 2017-05-28 MED ORDER — DICLOFENAC SODIUM 75 MG PO TBEC: 75.0000 mg | DELAYED_RELEASE_TABLET | Freq: Two times a day (BID) | ORAL | 3 refills | Status: DC

## 2017-05-28 MED ORDER — GABAPENTIN 300 MG PO CAPS: 300.0000 mg | ORAL_CAPSULE | Freq: Every day | ORAL | 3 refills | Status: DC

## 2017-05-28 NOTE — Progress Notes (Signed)
Office Visit Note   Patient: Joshua Case           Date of Birth: [birth year]           MRN: [id number] Visit Date: 05/28/2017              Requested by: Lujean Amel, MD [address], [place] [address] PCP: [facility] [place]   Assessment & Plan: Visit Diagnoses:  1. Chronic right-sided low back pain with right-sided sciatica   2. Degenerative disc disease, lumbar   3. Bilateral primary osteoarthritis of hip   4. Sacroiliac joint dysfunction of both sides   5. Spinal stenosis of lumbar region with neurogenic claudication   6. Other spondylosis with radiculopathy, lumbar region     Plan: Avoid frequent bending and stooping  No lifting greater than 10 lbs. May use ice or moist heat for pain.. Avoid bending, stooping and avoid lifting weights greater than 10 lbs. Avoid prolong standing and walking. Avoid frequent bending and stooping  May use ice or moist heat for pain. Weight loss is of benefit. Handicap license is approved. Dr. Romona Curls secretary/Assistant will call to arrange for epidural steroid injection   Follow-Up Instructions: Return in about 4 weeks (around 06/25/2017).   Orders:  Orders Placed This Encounter  Procedures  . XR Lumbar Spine 2-3 Views   No orders of the defined types were placed in this encounter.     Procedures: No procedures performed   Clinical Data: No additional findings.   Subjective: Chief Complaint  Patient presents with  . Lower Back - Pain  . 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 He does work at [employer] facility in [place] and he has pain the the end of most 10 hour shift. He has had intermittandt pain in the back and into the legs right and left and since October of 2017 more constant into the right leg anteriorly above the knee. Pain is present with standing and walking and prolong sitting too long. Sometimes  numbness, sometimes feels like a bruise or like burning pain. No bowel or bladder difficulty. Unable to walk a mile previously able to walk a mile until last Oct. Some days just walking into the building is painful. Has seen Dr. Dorthy Cooler and Dr. Frederich Cha about the back difficulties. Has had PT but not attempts at [facility] or other. He has feelings of weakness, if he works a week he feels weak in the legs, he has been with the [employer] 30 years.     Review of Systems  Constitutional: Positive for activity change. Negative for appetite change, chills, diaphoresis, fatigue, fever and unexpected weight change.  HENT: Negative.   Eyes: Negative.  Negative for redness, itching and visual disturbance.  Respiratory: Positive for apnea. Negative for chest tightness, shortness of breath and wheezing.   Cardiovascular: Negative.  Negative for chest pain, palpitations and leg swelling.  Gastrointestinal: Negative.  Negative for abdominal distention, abdominal pain, anal bleeding, blood in stool, nausea, rectal pain and vomiting.  Endocrine: Negative.   Genitourinary: Negative.  Negative for difficulty urinating, dysuria, enuresis, flank pain, frequency, genital sores and hematuria.  Musculoskeletal: Positive for arthralgias, back pain, gait problem, joint swelling and myalgias.  Skin: Negative.  Negative for color change, pallor, rash and wound.  Allergic/Immunologic: Negative.  Negative for environmental allergies and food allergies.  Neurological: Positive  for weakness and numbness. Negative for dizziness, tremors, seizures, syncope, facial asymmetry, speech difficulty, light-headedness and headaches.  Hematological: Negative.  Negative for adenopathy. Does not bruise/bleed easily.  Psychiatric/Behavioral: Negative for agitation, behavioral problems, confusion, decreased concentration, dysphoric mood, hallucinations, self-injury, sleep disturbance and suicidal ideas. The patient is not  nervous/anxious and is not hyperactive.      Objective: Vital Signs: BP (!) 155/87 (BP Location: Left Arm, Patient Position: Sitting)   Pulse 71   Ht 6\' 3"  (1.905 m)   Wt 290 lb (131.5 kg)   BMI 36.25 kg/m   Physical Exam  Constitutional: He is oriented to person, place, and time. He appears well-developed and well-nourished. No distress.  HENT:  Head: Normocephalic and atraumatic.  Eyes: Pupils are equal, round, and reactive to light. EOM are normal. Right eye exhibits no discharge. Left eye exhibits no discharge.  Neck: Normal range of motion. Neck supple. No tracheal deviation present. No thyromegaly present.  Cardiovascular: Exam reveals no gallop and no friction rub.   No murmur heard. Pulmonary/Chest: Effort normal and breath sounds normal. No respiratory distress. He has no wheezes. He has no rales. He exhibits no tenderness.  Abdominal: Soft. Bowel sounds are normal. He exhibits no distension and no mass. There is no tenderness. There is no guarding.  Musculoskeletal: Normal range of motion. He exhibits no edema or deformity.  Neurological: He is alert and oriented to person, place, and time.  Skin: Skin is warm and dry. No rash noted. He is not diaphoretic. No erythema. No pallor.  Psychiatric: He has a normal mood and affect. His behavior is normal. Judgment and thought content normal.    Ortho Exam  Specialty Comments:  No specialty comments available.  Imaging: Xr Lumbar Spine 2-3 Views  Result Date: 05/28/2017 AP and lateral flexion and extension with DDD L4-5 and L5-S1 greater than L1-2 and L2-3, anterior spurs L1-2, L2-3 and L4-5 and L5-S1. No listhesis, moderated spondylosis changes L3-S1 bilateral moderate DJD of the SI joint and Hips .    PMFS History: Patient Active Problem List   Diagnosis Date Noted  . HAV (hallux abducto valgus) 10/20/2014  . Bunion 10/20/2014  . Plantar porokeratosis, acquired 10/20/2014  . Lumbar disc disease with radiculopathy  07/13/2013  . DIAB W/RENAL MANIFESTS TYPE II/UNS TYPE UNCNTRL 10/15/2010  . DYSLIPIDEMIA 10/15/2010  . HYPERTENSION, ESSENTIAL, UNCONTROLLED 10/15/2010  . MICROALBUMINURIA 10/15/2010  . PERS HX NONCOMPLIANCE W/MED TX PRS HAZARDS HLTH 10/15/2010   Past Medical History:  Diagnosis Date  . Diabetes mellitus without complication ([place])   . Hypertension     Family History  Problem Relation Age of Onset  . Heart disease Mother   . Diabetes Father   . Hypertension Father   . Diabetes Daughter   . Hypertension Daughter     No past surgical history on file. Social History   Occupational History  . Not on file.   Social History Main Topics  . Smoking status: Never Smoker  . Smokeless tobacco: Never Used  . Alcohol use 0.0 oz/week  . Drug use: Yes    Types: Marijuana  . Sexual activity: Not on file

## 2017-05-28 NOTE — Patient Instructions (Signed)
Avoid frequent bending and stooping  No lifting greater than 10 lbs. May use ice or moist heat for pain.. Avoid bending, stooping and avoid lifting weights greater than 10 lbs. Avoid prolong standing and walking. Avoid frequent bending and stooping  May use ice or moist heat for pain. Weight loss is of benefit. Handicap license is approved. Dr. Romona Curls secretary/Assistant will call to arrange for epidural steroid injection

## 2017-07-02 ENCOUNTER — Ambulatory Visit (INDEPENDENT_AMBULATORY_CARE_PROVIDER_SITE_OTHER): Payer: Federal, State, Local not specified - PPO | Admitting: Specialist

## 2017-07-10 ENCOUNTER — Encounter (INDEPENDENT_AMBULATORY_CARE_PROVIDER_SITE_OTHER): Payer: Self-pay

## 2017-08-06 ENCOUNTER — Telehealth (INDEPENDENT_AMBULATORY_CARE_PROVIDER_SITE_OTHER): Payer: Self-pay | Admitting: Physical Medicine and Rehabilitation

## 2017-08-06 NOTE — Telephone Encounter (Signed)
I see where patients referral for Dr. Ernestina Patches is closed but he states he needed to see him before he sees Dr. Louanne Skye again. Can you open it back up and schedule an appt for him if possible? CB # [phone]

## 2017-08-07 NOTE — Telephone Encounter (Signed)
Scheduled for 08/31/17 with a driver.

## 2017-08-10 ENCOUNTER — Ambulatory Visit (INDEPENDENT_AMBULATORY_CARE_PROVIDER_SITE_OTHER): Payer: Federal, State, Local not specified - PPO | Admitting: Specialist

## 2017-08-31 ENCOUNTER — Ambulatory Visit (INDEPENDENT_AMBULATORY_CARE_PROVIDER_SITE_OTHER): Payer: Self-pay

## 2017-08-31 ENCOUNTER — Encounter (INDEPENDENT_AMBULATORY_CARE_PROVIDER_SITE_OTHER): Payer: Self-pay | Admitting: Physical Medicine and Rehabilitation

## 2017-08-31 ENCOUNTER — Ambulatory Visit (INDEPENDENT_AMBULATORY_CARE_PROVIDER_SITE_OTHER): Payer: Federal, State, Local not specified - PPO | Admitting: Physical Medicine and Rehabilitation

## 2017-08-31 VITALS — BP 153/81 | HR 76 | Temp 99.0°F

## 2017-08-31 DIAGNOSIS — M5416 Radiculopathy, lumbar region: Secondary | ICD-10-CM

## 2017-08-31 MED ORDER — BETAMETHASONE SOD PHOS & ACET 6 (3-3) MG/ML IJ SUSP
12.0000 mg | Freq: Once | INTRAMUSCULAR | Status: AC
  Administered 2017-08-31: 12 mg

## 2017-08-31 NOTE — Progress Notes (Deleted)
Pt states pain, numbness, and tingling in right thigh. Standing, sitting, and getting out of bed causes pain to increase. +Driver, -BT, -Dye Allergies.

## 2017-09-11 NOTE — Procedures (Signed)
Joshua Case is a 57 year old gentleman with a year right hip and leg pain with numbness and tingling worse with standing worse with sitting really caused him a lot of distress at this point.  He is followed by Dr. Louanne Skye who was obtained MRIs and is followed him conservatively.  He request diagnostic and hopefully therapeutic right L2 transforaminal injection for more foraminal issue at the L2 level.  He also requests interlaminar epidural injection at L5-S1 more lateral recess issues at the very bottom of the lower spine.  The injection  will be diagnostic and hopefully therapeutic. The patient has failed conservative care including time, medications and activity modification.  Betamethasone medication was split in half one aliquot for each location.  Lumbosacral Transforaminal Epidural Steroid Injection - Sub-Pedicular Approach with Fluoroscopic Guidance  Patient: Joshua Case      Date of Birth: [birth year] MRN: [id number] PCP: [facility]      Visit Date: 08/31/2017   Universal Protocol:    Date/Time: 08/31/2017  Consent Given By: the patient  Position: PRONE  Additional Comments: Vital signs were monitored before and after the procedure. Patient was prepped and draped in the usual sterile fashion. The correct patient, procedure, and site was verified.   Injection Procedure Details:  Procedure Site One Meds Administered:  Meds ordered this encounter  Medications  . betamethasone acetate-betamethasone sodium phosphate (CELESTONE) injection 12 mg    Laterality: Right  Location/Site:  L2-L3  Needle size: 22 G  Needle type: Spinal  Needle Placement: Transforaminal  Findings:    -Comments: Excellent flow of contrast along the nerve and into the epidural space.  Procedure Details: After squaring off the end-plates to get a true AP view, the C-arm was positioned so that an oblique view of the foramen as noted above was visualized. The target area is  just inferior to the "nose of the scotty dog" or sub pedicular. The soft tissues overlying this structure were infiltrated with 2-3 ml. of 1% Lidocaine without Epinephrine.  The spinal needle was inserted toward the target using a "trajectory" view along the fluoroscope beam.  Under AP and lateral visualization, the needle was advanced so it did not puncture dura and was located close the 6 O'Clock position of the pedical in AP tracterory. Biplanar projections were used to confirm position. Aspiration was confirmed to be negative for CSF and/or blood. A 1-2 ml. volume of Isovue-250 was injected and flow of contrast was noted at each level. Radiographs were obtained for documentation purposes.   After attaining the desired flow of contrast documented above, a 0.5 to 1.0 ml test dose of 0.25% Marcaine was injected into each respective transforaminal space.  The patient was observed for 90 seconds post injection.  After no sensory deficits were reported, and normal lower extremity motor function was noted,   the above injectate was administered so that equal amounts of the injectate were placed at each foramen (level) into the transforaminal epidural space.   Additional Comments:  No complications occurred Dressing: Band-Aid   Lumbar Epidural Steroid Injection - Interlaminar Approach with Fluoroscopic Guidance  Patient: Joshua Case      Date of Birth: [birth year] MRN: [id number] PCP: [facility]      Visit Date: 08/31/2017   Universal Protocol:     Consent Given By: the patient  Position: PRONE  Additional Comments: Vital signs were monitored before and after the procedure. Patient was prepped and draped in the usual sterile  fashion. The correct patient, procedure, and site was verified.   Injection Procedure Details:  Procedure Site One Meds Administered:  Meds ordered this encounter  Medications  . betamethasone acetate-betamethasone sodium phosphate  (CELESTONE) injection 12 mg     Laterality: Right  Location/Site:  L5-S1  Needle size: 20 G  Needle type: Tuohy  Needle Placement: Paramedian epidural  Findings:   -Comments: Excellent flow of contrast into the epidural space.  Procedure Details: Using a paramedian approach from the side mentioned above, the region overlying the inferior lamina was localized under fluoroscopic visualization and the soft tissues overlying this structure were infiltrated with 4 ml. of 1% Lidocaine without Epinephrine. The Tuohy needle was inserted into the epidural space using a paramedian approach.   The epidural space was localized using loss of resistance along with lateral and bi-planar fluoroscopic views.  After negative aspirate for air, blood, and CSF, a 2 ml. volume of Isovue-250 was injected into the epidural space and the flow of contrast was observed. Radiographs were obtained for documentation purposes.    The injectate was administered into the level noted above.   Additional Comments:  The patient tolerated the procedure well Dressing: Band-Aid    Post-procedure details: Patient was observed during the procedure. Post-procedure instructions were reviewed.  Patient left the clinic in stable condition.  Pertinent Imaging: Lumbar MRI  05/30/2016  IMPRESSION: 1. New rightward disc protrusion at L2-3 results in moderate right foraminal narrowing and mild right subarticular stenosis. 2. Broad-based disc protrusion and moderate facet hypertrophy at L3-4 has progressed with mild left foraminal stenosis. 3. Progressive broad-based disc protrusion and moderate facet hypertrophy at L4-5 with moderate central canal stenosis and right greater than left subarticular narrowing. 4. Moderate foraminal narrowing bilaterally at L4-5. 5. Stable left paramedian disc protrusion at L5-S1 with moderate left mild right subarticular stenosis.   Electronically Signed   By: San Morelle  M.D.   On: 05/30/2016 14:31  Pertinent Imaging: Lumbar MRI  05/30/2016  IMPRESSION: 1. New rightward disc protrusion at L2-3 results in moderate right foraminal narrowing and mild right subarticular stenosis. 2. Broad-based disc protrusion and moderate facet hypertrophy at L3-4 has progressed with mild left foraminal stenosis. 3. Progressive broad-based disc protrusion and moderate facet hypertrophy at L4-5 with moderate central canal stenosis and right greater than left subarticular narrowing. 4. Moderate foraminal narrowing bilaterally at L4-5. 5. Stable left paramedian disc protrusion at L5-S1 with moderate left mild right subarticular stenosis.   Electronically Signed   By: San Morelle M.D.   On: 05/30/2016 14:31

## 2017-09-21 ENCOUNTER — Ambulatory Visit (INDEPENDENT_AMBULATORY_CARE_PROVIDER_SITE_OTHER): Payer: Federal, State, Local not specified - PPO | Admitting: Specialist

## 2017-09-21 ENCOUNTER — Encounter (INDEPENDENT_AMBULATORY_CARE_PROVIDER_SITE_OTHER): Payer: Self-pay | Admitting: Specialist

## 2017-09-21 VITALS — BP 161/95 | HR 89 | Ht 75.0 in | Wt 300.0 lb

## 2017-09-21 DIAGNOSIS — M48062 Spinal stenosis, lumbar region with neurogenic claudication: Secondary | ICD-10-CM | POA: Diagnosis not present

## 2017-09-21 DIAGNOSIS — M4726 Other spondylosis with radiculopathy, lumbar region: Secondary | ICD-10-CM | POA: Diagnosis not present

## 2017-09-21 DIAGNOSIS — M5136 Other intervertebral disc degeneration, lumbar region: Secondary | ICD-10-CM | POA: Diagnosis not present

## 2017-09-21 NOTE — Patient Instructions (Signed)
Avoid bending, stooping and avoid lifting weights greater than 10 lbs. Avoid prolong standing and walking. Avoid frequent bending and stooping  No lifting greater than 10 lbs. May use ice or moist heat for pain. Weight loss is of benefit. Handicap license is approved. FLMA form for periods of impaired function.

## 2017-09-21 NOTE — Progress Notes (Signed)
Office Visit Note   Patient: Joshua Case           Date of Birth: 24-Jul-1961           MRN: [id number] Visit Date: 09/21/2017              Requested by: [facility] [address] [place], [place] [address] PCP: Pa, [place]: Visit Diagnoses:  1. Spinal stenosis of lumbar region with neurogenic claudication   2. Degenerative disc disease, lumbar   3. Other spondylosis with radiculopathy, lumbar region     Plan: Avoid bending, stooping and avoid lifting weights greater than 10 lbs. Avoid prolong standing and walking. Avoid frequent bending and stooping  No lifting greater than 10 lbs. May use ice or moist heat for pain. Weight loss is of benefit. Handicap license is approved. FLMA form for periods of impaired function.  Follow-Up Instructions: Return in about 6 weeks (around 11/02/2017).   Orders:  No orders of the defined types were placed in this encounter.  No orders of the defined types were placed in this encounter.     Procedures: No procedures performed   Clinical Data: No additional findings.   Subjective: Chief Complaint  Patient presents with  . Lower Back - Follow-up    He has Right L2-3 Transforminal injection with Dr. Ernestina Patches on 08/31/2017. He states that it has helped his back but not so much his thigh and knee.    57 year old male with pain in the back and radiation into the right leg. The pain improves with standing but he is limited in his standing and walking and repetitive bending and stooping. The ESI performed on 12/31 helped. No bowel or bladder difficulty. Not able to walk a large distance, legs do feel weak, on the concrete daily increases the pain in the legs and discomfort. He has joined a gym, the [employer], [employer].     Review of Systems   Objective: Vital Signs: BP (!) 161/95 (BP Location: Left Arm, Patient Position: Sitting)   Pulse 89   Ht  6\' 3"  (1.905 m)   Wt 300 lb (136.1 kg)   BMI 37.50 kg/m   Physical Exam  Constitutional: He is oriented to person, place, and time. He appears well-developed and well-nourished.  HENT:  Head: Normocephalic and atraumatic.  Eyes: EOM are normal. Pupils are equal, round, and reactive to light.  Neck: Normal range of motion. Neck supple.  Pulmonary/Chest: Effort normal and breath sounds normal.  Abdominal: Soft. Bowel sounds are normal.  Musculoskeletal: Normal range of motion.  Neurological: He is alert and oriented to person, place, and time.  Skin: Skin is warm and dry.  Psychiatric: He has a normal mood and affect. His behavior is normal. Judgment and thought content normal.    Back Exam   Tenderness  The patient is experiencing tenderness in the lumbar.  Range of Motion  Extension: normal  Flexion: normal  Lateral bend right: normal  Lateral bend left: normal  Rotation right: normal  Rotation left: normal   Muscle Strength  Right Quadriceps:  5/5  Left Quadriceps:  5/5  Right Hamstrings:  5/5  Left Hamstrings:  5/5   Tests  Straight leg raise right: negative Straight leg raise left: negative  Reflexes  Patellar: normal Achilles: normal Biceps: normal Babinski's sign: normal   Other  Toe walk: normal Heel walk: normal Sensation: normal Gait:  drop-foot  Erythema: no back redness Scars: absent      Specialty Comments:  No specialty comments available.  Imaging: No results found.   PMFS History: Patient Active Problem List   Diagnosis Date Noted  . HAV (hallux abducto valgus) 10/20/2014  . Bunion 10/20/2014  . Plantar porokeratosis, acquired 10/20/2014  . Lumbar disc disease with radiculopathy 07/13/2013  . DIAB W/RENAL MANIFESTS TYPE II/UNS TYPE UNCNTRL 10/15/2010  . DYSLIPIDEMIA 10/15/2010  . HYPERTENSION, ESSENTIAL, UNCONTROLLED 10/15/2010  . MICROALBUMINURIA 10/15/2010  . PERS HX NONCOMPLIANCE W/MED TX PRS HAZARDS HLTH 10/15/2010    Past Medical History:  Diagnosis Date  . Diabetes mellitus without complication ([place])   . Hypertension     Family History  Problem Relation Age of Onset  . Heart disease Mother   . Diabetes Father   . Hypertension Father   . Diabetes Daughter   . Hypertension Daughter     History reviewed. No pertinent surgical history. Social History   Occupational History  . Not on file  Tobacco Use  . Smoking status: Never Smoker  . Smokeless tobacco: Never Used  Substance and Sexual Activity  . Alcohol use: Yes    Alcohol/week: 0.0 oz  . Drug use: Yes    Types: Marijuana  . Sexual activity: Not on file

## 2017-11-02 ENCOUNTER — Ambulatory Visit (INDEPENDENT_AMBULATORY_CARE_PROVIDER_SITE_OTHER): Payer: Federal, State, Local not specified - PPO | Admitting: Specialist

## 2017-12-09 ENCOUNTER — Ambulatory Visit (INDEPENDENT_AMBULATORY_CARE_PROVIDER_SITE_OTHER): Payer: Federal, State, Local not specified - PPO | Admitting: Specialist

## 2017-12-09 ENCOUNTER — Encounter (INDEPENDENT_AMBULATORY_CARE_PROVIDER_SITE_OTHER): Payer: Self-pay | Admitting: Specialist

## 2017-12-09 VITALS — BP 172/90 | HR 72 | Ht 75.0 in | Wt 300.0 lb

## 2017-12-09 DIAGNOSIS — M160 Bilateral primary osteoarthritis of hip: Secondary | ICD-10-CM

## 2017-12-09 DIAGNOSIS — M48062 Spinal stenosis, lumbar region with neurogenic claudication: Secondary | ICD-10-CM

## 2017-12-09 NOTE — Progress Notes (Signed)
Office Visit Note   Patient: Joshua Case           Date of Birth: 1961-05-20           MRN: [id number] Visit Date: 12/09/2017              Requested by: [facility] [address] [place],  [id number] PCP: Pa, [place]: Visit Diagnoses:  1. Spinal stenosis of lumbar region with neurogenic claudication   2. Bilateral primary osteoarthritis of hip     Plan: Avoid bending, stooping and avoid lifting weights greater than 10 lbs. Avoid prolong standing and walking. Avoid frequent bending and stooping  No lifting greater than 10 lbs. May use ice or moist heat for pain. Weight loss is of benefit. Handicap license is approved. Dr. Romona Curls secretary/Assistant will call to arrange for epidural steroid injection  Follow-Up Instructions: Return in about 3 months (around 03/10/2018).   Orders:  No orders of the defined types were placed in this encounter.  No orders of the defined types were placed in this encounter.     Procedures: No procedures performed   Clinical Data: No additional findings.   Subjective: Chief Complaint  Patient presents with  . 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 He works at the [employer] in [employer]. He has DJD of the hips and Lumbar spinal stenosis L2-3 foramenal and L5-S1  Centrally. The narrowing limits his standing and walking. He does find some improvement with ESI done at L2-3 and L5-S1 by Dr. Ernestina Patches 08/31/2018.  He is having recurrence of the Pain into the buttocks and thighs and has starting pain with prolong sitting as well. He is working 10-12 hours at a time and as the week goes on the pain gets worse.    Review of Systems  Constitutional: Negative.   HENT: Negative.   Eyes: Negative.   Respiratory: Negative.   Cardiovascular: Negative.   Gastrointestinal: Negative.   Endocrine: Negative.     Genitourinary: Negative.   Musculoskeletal: Negative.   Skin: Negative.   Allergic/Immunologic: Negative.   Neurological: Negative.   Hematological: Negative.   Psychiatric/Behavioral: Negative.      Objective: Vital Signs: BP (!) 172/90 (BP Location: Left Arm, Patient Position: Sitting)   Pulse 72   Ht 6\' 3"  (1.905 m)   Wt 300 lb (136.1 kg)   BMI 37.50 kg/m   Physical Exam  Constitutional: He is oriented to person, place, and time. He appears well-developed and well-nourished.  HENT:  Head: Normocephalic and atraumatic.  Eyes: Pupils are equal, round, and reactive to light. EOM are normal.  Neck: Normal range of motion. Neck supple.  Pulmonary/Chest: Effort normal and breath sounds normal.  Abdominal: Soft. Bowel sounds are normal.  Neurological: He is alert and oriented to person, place, and time.  Skin: Skin is warm and dry.  Psychiatric: He has a normal mood and affect. His behavior is normal. Judgment and thought content normal.    Back Exam   Tenderness  The patient is experiencing tenderness in the lumbar.  Range of Motion  Extension: abnormal  Flexion: abnormal  Lateral bend right: abnormal  Lateral bend left: abnormal  Rotation right: abnormal  Rotation left: abnormal   Muscle Strength  Right Quadriceps:  5/5  Left Quadriceps:  5/5  Right Hamstrings:  5/5  Left Hamstrings:  5/5  Tests  Straight leg raise right: negative Straight leg raise left: negative  Reflexes  Patellar: normal Achilles: normal Babinski's sign: normal   Other  Toe walk: normal Heel walk: normal Sensation: normal Gait: normal       Specialty Comments:  No specialty comments available.  Imaging: No results found.   PMFS History: Patient Active Problem List   Diagnosis Date Noted  . HAV (hallux abducto valgus) 10/20/2014  . Bunion 10/20/2014  . Plantar porokeratosis, acquired 10/20/2014  . Lumbar disc disease with radiculopathy 07/13/2013  . DIAB W/RENAL  MANIFESTS TYPE II/UNS TYPE UNCNTRL 10/15/2010  . DYSLIPIDEMIA 10/15/2010  . HYPERTENSION, ESSENTIAL, UNCONTROLLED 10/15/2010  . MICROALBUMINURIA 10/15/2010  . PERS HX NONCOMPLIANCE W/MED TX PRS HAZARDS HLTH 10/15/2010   Past Medical History:  Diagnosis Date  . Diabetes mellitus without complication ([place])   . Hypertension     Family History  Problem Relation Age of Onset  . Heart disease Mother   . Diabetes Father   . Hypertension Father   . Diabetes Daughter   . Hypertension Daughter     History reviewed. No pertinent surgical history. Social History   Occupational History  . Not on file  Tobacco Use  . Smoking status: Never Smoker  . Smokeless tobacco: Never Used  Substance and Sexual Activity  . Alcohol use: Yes    Alcohol/week: 0.0 oz  . Drug use: Yes    Types: Marijuana  . Sexual activity: Not on file

## 2017-12-09 NOTE — Patient Instructions (Signed)
Avoid bending, stooping and avoid lifting weights greater than 10 lbs. Avoid prolong standing and walking. Avoid frequent bending and stooping  No lifting greater than 10 lbs. May use ice or moist heat for pain. Weight loss is of benefit. Handicap license is approved. Dr. Newton's secretary/Assistant will call to arrange for epidural steroid injection  

## 2018-01-19 ENCOUNTER — Encounter (INDEPENDENT_AMBULATORY_CARE_PROVIDER_SITE_OTHER): Payer: Self-pay | Admitting: Physical Medicine and Rehabilitation

## 2018-02-22 ENCOUNTER — Telehealth (INDEPENDENT_AMBULATORY_CARE_PROVIDER_SITE_OTHER): Payer: Self-pay | Admitting: *Deleted

## 2018-02-22 ENCOUNTER — Encounter (INDEPENDENT_AMBULATORY_CARE_PROVIDER_SITE_OTHER): Payer: Self-pay | Admitting: Physical Medicine and Rehabilitation

## 2018-03-10 ENCOUNTER — Ambulatory Visit (INDEPENDENT_AMBULATORY_CARE_PROVIDER_SITE_OTHER): Payer: Federal, State, Local not specified - PPO | Admitting: Specialist

## 2018-03-19 NOTE — Telephone Encounter (Signed)
error 

## 2018-03-22 ENCOUNTER — Ambulatory Visit (INDEPENDENT_AMBULATORY_CARE_PROVIDER_SITE_OTHER): Payer: Self-pay

## 2018-03-22 ENCOUNTER — Encounter (INDEPENDENT_AMBULATORY_CARE_PROVIDER_SITE_OTHER): Payer: Self-pay | Admitting: Physical Medicine and Rehabilitation

## 2018-03-22 ENCOUNTER — Ambulatory Visit (INDEPENDENT_AMBULATORY_CARE_PROVIDER_SITE_OTHER): Payer: Federal, State, Local not specified - PPO | Admitting: Physical Medicine and Rehabilitation

## 2018-03-22 VITALS — BP 168/95 | HR 80

## 2018-03-22 DIAGNOSIS — M5116 Intervertebral disc disorders with radiculopathy, lumbar region: Secondary | ICD-10-CM | POA: Diagnosis not present

## 2018-03-22 DIAGNOSIS — M5416 Radiculopathy, lumbar region: Secondary | ICD-10-CM | POA: Diagnosis not present

## 2018-03-22 DIAGNOSIS — M48062 Spinal stenosis, lumbar region with neurogenic claudication: Secondary | ICD-10-CM

## 2018-03-22 MED ORDER — DEXAMETHASONE SODIUM PHOSPHATE 10 MG/ML IJ SOLN
8.0000 mg | Freq: Once | INTRAMUSCULAR | Status: AC
  Administered 2018-03-22: 8 mg

## 2018-03-22 MED ORDER — METHYLPREDNISOLONE ACETATE 80 MG/ML IJ SUSP
40.0000 mg | Freq: Once | INTRAMUSCULAR | Status: AC
  Administered 2018-03-22: 40 mg

## 2018-03-22 NOTE — Progress Notes (Signed)
 .  Numeric Pain Rating Scale and Functional Assessment Average Pain 5   In the last MONTH (on 0-10 scale) has pain interfered with the following?  1. General activity like being  able to carry out your everyday physical activities such as walking, climbing stairs, carrying groceries, or moving a chair?  Rating(2)   +Driver, -BT, -Dye Allergies.

## 2018-03-22 NOTE — Patient Instructions (Signed)

## 2018-04-01 NOTE — Progress Notes (Signed)
Joshua Case - 57 y.o. male MRN [id number]  Date of birth: [birth year]  Office Visit Note: Visit Date: 03/22/2018 PCP: [facility] Referred by: [facility]  Subjective: Chief Complaint  Patient presents with  . Lower Back - Pain  . Right Leg - Pain  . Left Leg - Pain   HPI: Joshua Case is a 57 year old gentleman in the care of Dr. Basil Dess from a spine surgery standpoint.  We saw him in December and completed a right L2 transforaminal injection and L5-S1 left central epidural injection from an interlaminar approach he did get good relief at the time.  Dr. Louanne Skye request repeat injection for low back and right and left hip and leg pain.   ROS Otherwise per HPI.  Assessment & Plan: Visit Diagnoses:  1. Lumbar radiculopathy   2. Radiculopathy due to lumbar intervertebral disc disorder   3. Spinal stenosis of lumbar region with neurogenic claudication     Plan: No additional findings.   Meds & Orders:  Meds ordered this encounter  Medications  . dexamethasone (DECADRON) injection 8 mg  . methylPREDNISolone acetate (DEPO-MEDROL) injection 40 mg    Orders Placed This Encounter  Procedures  . XR C-ARM NO REPORT  . Epidural Steroid injection    Follow-up: Return if symptoms worsen or fail to improve, for Dr. Basil Dess.   Procedures: No procedures performed  Lumbosacral Transforaminal Epidural Steroid Injection - Sub-Pedicular Approach with Fluoroscopic Guidance  Patient: Joshua Case      Date of Birth: 27-Dec-1960 MRN: [id number] PCP: [facility]      Visit Date: 03/22/2018   Universal Protocol:    Date/Time: 03/22/2018  Consent Given By: the patient  Position: PRONE  Additional Comments: Vital signs were monitored before and after the procedure. Patient was prepped and draped in the usual sterile fashion. The correct patient, procedure, and site was verified.   Injection Procedure Details:    Procedure Site One Meds Administered:  Meds ordered this encounter  Medications  . dexamethasone (DECADRON) injection 8 mg  . methylPREDNISolone acetate (DEPO-MEDROL) injection 40 mg    Laterality: Right  Location/Site:  L2-L3  Needle size: 22 G  Needle type: Spinal  Needle Placement: Transforaminal  Findings:    -Comments: Excellent flow of contrast along the nerve and into the epidural space.  Dexamethasone was used for the transforaminal injection.  Procedure Details: After squaring off the end-plates to get a true AP view, the C-arm was positioned so that an oblique view of the foramen as noted above was visualized. The target area is just inferior to the "nose of the scotty dog" or sub pedicular. The soft tissues overlying this structure were infiltrated with 2-3 ml. of 1% Lidocaine without Epinephrine.  The spinal needle was inserted toward the target using a "trajectory" view along the fluoroscope beam.  Under AP and lateral visualization, the needle was advanced so it did not puncture dura and was located close the 6 O'Clock position of the pedical in AP tracterory. Biplanar projections were used to confirm position. Aspiration was confirmed to be negative for CSF and/or blood. A 1-2 ml. volume of Isovue-250 was injected and flow of contrast was noted at each level. Radiographs were obtained for documentation purposes.   After attaining the desired flow of contrast documented above, a 0.5 to 1.0 ml test dose of 0.25% Marcaine was injected into each respective transforaminal space.  The patient was observed for  90 seconds post injection.  After no sensory deficits were reported, and normal lower extremity motor function was noted,   the above injectate was administered so that equal amounts of the injectate were placed at each foramen (level) into the transforaminal epidural space.  Lumbar Epidural Steroid Injection - Interlaminar Approach with Fluoroscopic  Guidance   Injection Procedure Details:  Procedure Site One Meds Administered:  Meds ordered this encounter  Medications  . dexamethasone (DECADRON) injection 8 mg  . methylPREDNISolone acetate (DEPO-MEDROL) injection 40 mg     Laterality: Left  Location/Site:  L5-S1  Needle size: 20 G  Needle type: Tuohy  Needle Placement: Paramedian epidural  Findings:   -Comments: Excellent flow of contrast into the epidural space.  Procedure Details: Using a paramedian approach from the side mentioned above, the region overlying the inferior lamina was localized under fluoroscopic visualization and the soft tissues overlying this structure were infiltrated with 4 ml. of 1% Lidocaine without Epinephrine. The Tuohy needle was inserted into the epidural space using a paramedian approach.   The epidural space was localized using loss of resistance along with lateral and bi-planar fluoroscopic views.  After negative aspirate for air, blood, and CSF, a 2 ml. volume of Isovue-250 was injected into the epidural space and the flow of contrast was observed. Radiographs were obtained for documentation purposes.    The injectate was administered into the level noted above.   Additional Comments:  The patient tolerated the procedure well No complications occurred Dressing: Band-Aid    Post-procedure details: Patient was observed during the procedure. Post-procedure instructions were reviewed.  Patient left the clinic in stable condition.    Clinical History: Lumbar MRI  05/30/2016  IMPRESSION: 1. New rightward disc protrusion at L2-3 results in moderate right foraminal narrowing and mild right subarticular stenosis. 2. Broad-based disc protrusion and moderate facet hypertrophy at L3-4 has progressed with mild left foraminal stenosis. 3. Progressive broad-based disc protrusion and moderate facet hypertrophy at L4-5 with moderate central canal stenosis and right greater than left  subarticular narrowing. 4. Moderate foraminal narrowing bilaterally at L4-5. 5. Stable left paramedian disc protrusion at L5-S1 with moderate left mild right subarticular stenosis.   Electronically Signed   By: San Morelle M.D.   On: 05/30/2016 14:31   He reports that he has never smoked. He has never used smokeless tobacco. No results for input(s): HGBA1C, LABURIC in the last 8760 hours.  Objective:  VS:  HT:    WT:   BMI:     BP:(!) 168/95  HR:80bpm  TEMP: ( )  RESP:  Physical Exam  Ortho Exam Imaging: No results found.  Past Medical/Family/Surgical/Social History: Medications & Allergies reviewed per EMR, new medications updated. Patient Active Problem List   Diagnosis Date Noted  . HAV (hallux abducto valgus) 10/20/2014  . Bunion 10/20/2014  . Plantar porokeratosis, acquired 10/20/2014  . Lumbar disc disease with radiculopathy 07/13/2013  . DIAB W/RENAL MANIFESTS TYPE II/UNS TYPE UNCNTRL 10/15/2010  . DYSLIPIDEMIA 10/15/2010  . HYPERTENSION, ESSENTIAL, UNCONTROLLED 10/15/2010  . MICROALBUMINURIA 10/15/2010  . PERS HX NONCOMPLIANCE W/MED TX PRS HAZARDS HLTH 10/15/2010   Past Medical History:  Diagnosis Date  . Diabetes mellitus without complication ([place])   . Hypertension    Family History  Problem Relation Age of Onset  . Heart disease Mother   . Diabetes Father   . Hypertension Father   . Diabetes Daughter   . Hypertension Daughter    History reviewed. No pertinent surgical history. Social  History   Occupational History  . Not on file  Tobacco Use  . Smoking status: Never Smoker  . Smokeless tobacco: Never Used  Substance and Sexual Activity  . Alcohol use: Yes    Alcohol/week: 0.0 oz  . Drug use: Yes    Types: Marijuana  . Sexual activity: Not on file

## 2018-04-01 NOTE — Procedures (Signed)
Lumbosacral Transforaminal Epidural Steroid Injection - Sub-Pedicular Approach with Fluoroscopic Guidance  Patient: Joshua Case      Date of Birth: 1960-10-15 MRN: [id number] PCP: [facility]      Visit Date: 03/22/2018   Universal Protocol:    Date/Time: 03/22/2018  Consent Given By: the patient  Position: PRONE  Additional Comments: Vital signs were monitored before and after the procedure. Patient was prepped and draped in the usual sterile fashion. The correct patient, procedure, and site was verified.   Injection Procedure Details:  Procedure Site One Meds Administered:  Meds ordered this encounter  Medications  . dexamethasone (DECADRON) injection 8 mg  . methylPREDNISolone acetate (DEPO-MEDROL) injection 40 mg    Laterality: Right  Location/Site:  L2-L3  Needle size: 22 G  Needle type: Spinal  Needle Placement: Transforaminal  Findings:    -Comments: Excellent flow of contrast along the nerve and into the epidural space.  Dexamethasone was used for the transforaminal injection.  Procedure Details: After squaring off the end-plates to get a true AP view, the C-arm was positioned so that an oblique view of the foramen as noted above was visualized. The target area is just inferior to the "nose of the scotty dog" or sub pedicular. The soft tissues overlying this structure were infiltrated with 2-3 ml. of 1% Lidocaine without Epinephrine.  The spinal needle was inserted toward the target using a "trajectory" view along the fluoroscope beam.  Under AP and lateral visualization, the needle was advanced so it did not puncture dura and was located close the 6 O'Clock position of the pedical in AP tracterory. Biplanar projections were used to confirm position. Aspiration was confirmed to be negative for CSF and/or blood. A 1-2 ml. volume of Isovue-250 was injected and flow of contrast was noted at each level. Radiographs were obtained for  documentation purposes.   After attaining the desired flow of contrast documented above, a 0.5 to 1.0 ml test dose of 0.25% Marcaine was injected into each respective transforaminal space.  The patient was observed for 90 seconds post injection.  After no sensory deficits were reported, and normal lower extremity motor function was noted,   the above injectate was administered so that equal amounts of the injectate were placed at each foramen (level) into the transforaminal epidural space.  Lumbar Epidural Steroid Injection - Interlaminar Approach with Fluoroscopic Guidance   Injection Procedure Details:  Procedure Site One Meds Administered:  Meds ordered this encounter  Medications  . dexamethasone (DECADRON) injection 8 mg  . methylPREDNISolone acetate (DEPO-MEDROL) injection 40 mg     Laterality: Left  Location/Site:  L5-S1  Needle size: 20 G  Needle type: Tuohy  Needle Placement: Paramedian epidural  Findings:   -Comments: Excellent flow of contrast into the epidural space.  Procedure Details: Using a paramedian approach from the side mentioned above, the region overlying the inferior lamina was localized under fluoroscopic visualization and the soft tissues overlying this structure were infiltrated with 4 ml. of 1% Lidocaine without Epinephrine. The Tuohy needle was inserted into the epidural space using a paramedian approach.   The epidural space was localized using loss of resistance along with lateral and bi-planar fluoroscopic views.  After negative aspirate for air, blood, and CSF, a 2 ml. volume of Isovue-250 was injected into the epidural space and the flow of contrast was observed. Radiographs were obtained for documentation purposes.    The injectate was administered into the level noted above.   Additional  Comments:  The patient tolerated the procedure well No complications occurred Dressing: Band-Aid    Post-procedure details: Patient was observed during  the procedure. Post-procedure instructions were reviewed.  Patient left the clinic in stable condition.

## 2018-06-03 ENCOUNTER — Ambulatory Visit (INDEPENDENT_AMBULATORY_CARE_PROVIDER_SITE_OTHER): Payer: Federal, State, Local not specified - PPO | Admitting: Specialist

## 2018-06-03 ENCOUNTER — Encounter (INDEPENDENT_AMBULATORY_CARE_PROVIDER_SITE_OTHER): Payer: Self-pay | Admitting: Specialist

## 2018-06-03 VITALS — BP 153/93 | HR 71 | Ht 75.0 in | Wt 300.0 lb

## 2018-06-03 DIAGNOSIS — M5136 Other intervertebral disc degeneration, lumbar region: Secondary | ICD-10-CM | POA: Diagnosis not present

## 2018-06-03 DIAGNOSIS — M160 Bilateral primary osteoarthritis of hip: Secondary | ICD-10-CM

## 2018-06-03 DIAGNOSIS — M48062 Spinal stenosis, lumbar region with neurogenic claudication: Secondary | ICD-10-CM

## 2018-06-03 DIAGNOSIS — M533 Sacrococcygeal disorders, not elsewhere classified: Secondary | ICD-10-CM

## 2018-06-03 DIAGNOSIS — G8929 Other chronic pain: Secondary | ICD-10-CM

## 2018-06-03 DIAGNOSIS — M4726 Other spondylosis with radiculopathy, lumbar region: Secondary | ICD-10-CM | POA: Diagnosis not present

## 2018-06-03 DIAGNOSIS — M5441 Lumbago with sciatica, right side: Secondary | ICD-10-CM | POA: Diagnosis not present

## 2018-06-03 MED ORDER — DICLOFENAC SODIUM 75 MG PO TBEC: 75.0000 mg | DELAYED_RELEASE_TABLET | Freq: Two times a day (BID) | ORAL | 3 refills | Status: DC

## 2018-06-03 NOTE — Progress Notes (Signed)
Office Visit Note   Patient: Joshua Case           Date of Birth: 1960-09-14           MRN: [id number] Visit Date: 06/03/2018              Requested by: [facility] [address], [place] [id number] PCP: Pa, [place]: Visit Diagnoses:  1. Spinal stenosis of lumbar region with neurogenic claudication   2. Other spondylosis with radiculopathy, lumbar region   3. Chronic right-sided low back pain with right-sided sciatica   4. Degenerative disc disease, lumbar   5. Bilateral primary osteoarthritis of hip   6. Sacroiliac joint dysfunction of both sides     Plan: Avoid bending, stooping and avoid lifting weights greater than 10 lbs. Avoid prolong standing and walking. Avoid frequent bending and stooping  No lifting greater than 10 lbs. May use ice or moist heat for pain. Weight loss is of benefit. Handicap license is approved. Dr. Romona Curls secretary/Assistant will call to arrange for bilateral hip steroid injection   Hips are suffering from osteoarthritis, only real proven treatments are Weight loss, NSIADs like diclofenac and exercise. Well padded shoes help. Ice the knee and hips 2-3 times a day 15-20 mins at a time. Xray the left knee at return appt.  Follow-Up Instructions: Return in about 6 weeks (around 07/15/2018).   Orders:  No orders of the defined types were placed in this encounter.  No orders of the defined types were placed in this encounter.     Procedures: No procedures performed   Clinical Data: No additional findings.   Subjective: Chief Complaint  Patient presents with  . Lower Back - Follow-up    He had a right L2-3 TF injection with Dr.Newton on 03/22/18, he states that he got 75% relief form the injection    57 year old male with good relief about 75% relief of pain into the right back and leg. I'm starting to feel the same kind of pain in the left  thigh like I had in my right thigh that the injection seemed to help. Low back pain is better post injection. There is left knee pain especially when at the end of the day he is going out to his Car. 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 He has been with the [employer] for 29 years. No bowel or bladder. The left knee and the thighs get weak with standing and walking. The left knee swells up, post 8-9 hour  Days with standing on his feet on concrete. Feels better with standing . The swelling in the left knee makes standing uncomfortable. There is numbness and tingling into the thighs, improved with the right ESI. The left hip is degenerative changes.     Review of Systems  Constitutional: Negative.  Negative for activity change, appetite change, chills, diaphoresis, fatigue, fever and unexpected weight change.  HENT: Negative.   Eyes: Negative.   Respiratory: Negative.   Cardiovascular: Negative.   Gastrointestinal: Negative.   Endocrine: Negative.   Genitourinary: Negative.   Musculoskeletal: Negative.   Skin: Negative.  Allergic/Immunologic: Negative.   Neurological: Negative.   Hematological: Negative.   Psychiatric/Behavioral: Negative.      Objective: Vital Signs: BP (!) 153/93 (BP Location: Left Arm, Patient Position: Sitting)   Pulse 71   Ht 6\' 3"  (1.905 m)   Wt 300 lb (136.1 kg)   BMI 37.50 kg/m   Physical Exam  Constitutional: He is oriented to person, place, and time. He appears well-developed and well-nourished.  HENT:  Head: Normocephalic and atraumatic.  Eyes: Pupils are equal, round, and reactive to light. EOM are normal.  Neck: Normal range of motion. Neck supple.  Pulmonary/Chest: Effort normal and  breath sounds normal.  Abdominal: Soft. Bowel sounds are normal.  Musculoskeletal: Normal range of motion.  Neurological: He is alert and oriented to person, place, and time.  Skin: Skin is warm and dry.  Psychiatric: He has a normal mood and affect. His behavior is normal. Judgment and thought content normal.    Back Exam   Tenderness  The patient is experiencing tenderness in the lumbar.  Range of Motion  Extension: normal  Flexion: normal  Lateral bend right: normal  Lateral bend left: normal  Rotation right: normal  Rotation left: normal   Muscle Strength  Right Quadriceps:  5/5  Left Quadriceps:  5/5  Right Hamstrings:  5/5  Left Hamstrings:  5/5   Tests  Straight leg raise right: negative Straight leg raise left: negative  Reflexes  Patellar: Hyporeflexic Achilles: Hyporeflexic Babinski's sign: normal       Specialty Comments:  No specialty comments available.  Imaging: No results found.   PMFS History: Patient Active Problem List   Diagnosis Date Noted  . HAV (hallux abducto valgus) 10/20/2014  . Bunion 10/20/2014  . Plantar porokeratosis, acquired 10/20/2014  . Lumbar disc disease with radiculopathy 07/13/2013  . DIAB W/RENAL MANIFESTS TYPE II/UNS TYPE UNCNTRL 10/15/2010  . DYSLIPIDEMIA 10/15/2010  . HYPERTENSION, ESSENTIAL, UNCONTROLLED 10/15/2010  . MICROALBUMINURIA 10/15/2010  . PERS HX NONCOMPLIANCE W/MED TX PRS HAZARDS HLTH 10/15/2010   Past Medical History:  Diagnosis Date  . Diabetes mellitus without complication ([place])   . Hypertension     Family History  Problem Relation Age of Onset  . Heart disease Mother   . Diabetes Father   . Hypertension Father   . Diabetes Daughter   . Hypertension Daughter     History reviewed. No pertinent surgical history. Social History   Occupational History  . Not on file  Tobacco Use  . Smoking status: Never Smoker  . Smokeless tobacco: Never Used  Substance and Sexual Activity  . Alcohol  use: Yes    Alcohol/week: 0.0 standard drinks  . Drug use: Yes    Types: Marijuana  . Sexual activity: Not on file

## 2018-06-03 NOTE — Patient Instructions (Signed)
Avoid bending, stooping and avoid lifting weights greater than 10 lbs. Avoid prolong standing and walking. Avoid frequent bending and stooping  No lifting greater than 10 lbs. May use ice or moist heat for pain. Weight loss is of benefit. Handicap license is approved. Dr. Romona Curls secretary/Assistant will call to arrange for bilateral hip steroid injection   Hips are suffering from osteoarthritis, only real proven treatments are Weight loss, NSIADs like diclofenac and exercise. Well padded shoes help. Ice the knee and hips 2-3 times a day 15-20 mins at a time. Xray the left knee at return appt.

## 2018-06-16 ENCOUNTER — Encounter (INDEPENDENT_AMBULATORY_CARE_PROVIDER_SITE_OTHER): Payer: Self-pay | Admitting: Physical Medicine and Rehabilitation

## 2018-06-16 ENCOUNTER — Ambulatory Visit (INDEPENDENT_AMBULATORY_CARE_PROVIDER_SITE_OTHER): Payer: Federal, State, Local not specified - PPO | Admitting: Physical Medicine and Rehabilitation

## 2018-06-16 ENCOUNTER — Ambulatory Visit (INDEPENDENT_AMBULATORY_CARE_PROVIDER_SITE_OTHER): Payer: Self-pay

## 2018-06-16 DIAGNOSIS — M25552 Pain in left hip: Secondary | ICD-10-CM | POA: Diagnosis not present

## 2018-06-16 NOTE — Progress Notes (Signed)
   Joshua Case - 57 y.o. male MRN [id number]  Date of birth: 1961-06-28  Office Visit Note: Visit Date: 06/16/2018 PCP: [facility] Referred by: [facility]  Subjective: Chief Complaint  Patient presents with  . Left Hip - Pain   HPI:  Joshua Case is a 57 y.o. male who comes in today At the request of Dr. Basil Dess for diagnostic and hopefully therapeutic anesthetic hip arthrogram on the left.  Patient has bilateral hip pain left more than right.  He does endorse pain in the left groin that started a few months ago without specific injury.  He is really been unable to work a lot do to the pain.  He has constant pain but working makes it worse particularly walking and standing.  Resting does make it somewhat better.  His average pain is 6 out of 10.  He is using opioid management.  ROS Otherwise per HPI.  Assessment & Plan: Visit Diagnoses:  1. Pain in left hip     Plan: Findings:  Diagnostic and hopefully therapeutic anesthetic hip arthrogram on the left.  Patient did have relief during the anesthetic phase.    Meds & Orders: No orders of the defined types were placed in this encounter.   Orders Placed This Encounter  Procedures  . Large Joint Inj: L hip joint  . XR C-ARM NO REPORT    Follow-up: Return if symptoms worsen or fail to improve, for Dr. Louanne Skye.   Procedures: Large Joint Inj: L hip joint on 06/16/2018 11:24 AM Indications: pain and diagnostic evaluation Details: 22 G needle, anterior approach  Arthrogram: Yes  Medications: 80 mg triamcinolone acetonide 40 MG/ML; 3 mL bupivacaine 0.5 % Outcome: tolerated well, no immediate complications  Arthrogram demonstrated excellent flow of contrast throughout the joint surface without extravasation or obvious defect.  The patient had relief of symptoms during the anesthetic phase of the injection.  Procedure, treatment alternatives, risks and benefits explained, specific risks  discussed. Consent was given by the patient. Immediately prior to procedure a time out was called to verify the correct patient, procedure, equipment, support staff and site/side marked as required. Patient was prepped and draped in the usual sterile fashion.      No notes on file   Clinical History: Lumbar MRI  05/30/2016  IMPRESSION: 1. New rightward disc protrusion at L2-3 results in moderate right foraminal narrowing and mild right subarticular stenosis. 2. Broad-based disc protrusion and moderate facet hypertrophy at L3-4 has progressed with mild left foraminal stenosis. 3. Progressive broad-based disc protrusion and moderate facet hypertrophy at L4-5 with moderate central canal stenosis and right greater than left subarticular narrowing. 4. Moderate foraminal narrowing bilaterally at L4-5. 5. Stable left paramedian disc protrusion at L5-S1 with moderate left mild right subarticular stenosis.   Electronically Signed   By: San Morelle M.D.   On: 05/30/2016 14:31     Objective:  VS:  HT:    WT:   BMI:     BP:   HR: bpm  TEMP: ( )  RESP:  Physical Exam  Ortho Exam Imaging: Xr C-arm No Report  Result Date: 06/16/2018 Please see Notes tab for imaging impression.

## 2018-06-16 NOTE — Patient Instructions (Signed)

## 2018-06-16 NOTE — Progress Notes (Signed)
 .  Numeric Pain Rating Scale and Functional Assessment Average Pain 6   In the last MONTH (on 0-10 scale) has pain interfered with the following?  1. General activity like being  able to carry out your everyday physical activities such as walking, climbing stairs, carrying groceries, or moving a chair?  Rating(4)    -Dye Allergies.  

## 2018-06-17 MED ORDER — TRIAMCINOLONE ACETONIDE 40 MG/ML IJ SUSP
80.0000 mg | INTRAMUSCULAR | Status: AC | PRN
Start: 2018-06-16 — End: 2018-06-16
  Administered 2018-06-16: 80 mg via INTRA_ARTICULAR

## 2018-06-17 MED ORDER — BUPIVACAINE HCL 0.5 % IJ SOLN
3.0000 mL | INTRAMUSCULAR | Status: AC | PRN
  Administered 2018-06-16: 3 mL via INTRA_ARTICULAR

## 2018-06-30 ENCOUNTER — Encounter (INDEPENDENT_AMBULATORY_CARE_PROVIDER_SITE_OTHER): Payer: Self-pay | Admitting: Physical Medicine and Rehabilitation

## 2018-06-30 ENCOUNTER — Ambulatory Visit (INDEPENDENT_AMBULATORY_CARE_PROVIDER_SITE_OTHER): Payer: Self-pay

## 2018-06-30 ENCOUNTER — Ambulatory Visit (INDEPENDENT_AMBULATORY_CARE_PROVIDER_SITE_OTHER): Payer: Federal, State, Local not specified - PPO | Admitting: Physical Medicine and Rehabilitation

## 2018-06-30 DIAGNOSIS — M25551 Pain in right hip: Secondary | ICD-10-CM

## 2018-06-30 MED ORDER — TRIAMCINOLONE ACETONIDE 40 MG/ML IJ SUSP
80.0000 mg | INTRAMUSCULAR | Status: AC | PRN
Start: 2018-06-30 — End: 2018-06-30
  Administered 2018-06-30: 80 mg via INTRA_ARTICULAR

## 2018-06-30 MED ORDER — BUPIVACAINE HCL 0.5 % IJ SOLN
3.0000 mL | INTRAMUSCULAR | Status: AC | PRN
  Administered 2018-06-30: 3 mL via INTRA_ARTICULAR

## 2018-06-30 NOTE — Patient Instructions (Signed)

## 2018-06-30 NOTE — Progress Notes (Signed)
Joshua Case - 57 y.o. male MRN [id number]  Date of birth: [birth year]  Office Visit Note: Visit Date: 06/30/2018 PCP: [facility] Referred by: [facility]  Subjective: Chief Complaint  Patient presents with  . Right Hip - Pain  . Right Thigh - Numbness   HPI: Joshua Case is a 57 y.o. male who comes in today For planned intra-articular anesthetic arthrogram of the right hip joint.  This is requested by Dr. Basil Dess and.  He is having worsening right hip and groin pain consistent with hip pathology.  Does have pain with internal rotation of the right hip.  Prior injection a few weeks ago on the left give the patient some relief during the anesthetic portion but overall has not helped a great deal.  He reports working is the main issue that that seems to flareup all of his pain.  We are going to complete the diagnostic note for therapeutic anesthetic hip arthrogram on the right today.  Patient will follow-up with Dr. Louanne Skye.  ROS Otherwise per HPI.  Assessment & Plan: Visit Diagnoses:  1. Pain in right hip     Plan: Findings:  Anesthetic arthrogram on the right hip with fluoroscopic guidance did give the patient good relief during the anesthetic phase.    Meds & Orders: No orders of the defined types were placed in this encounter.   Orders Placed This Encounter  Procedures  . Large Joint Inj: R hip joint  . XR C-ARM NO REPORT    Follow-up: Return for Basil Dess, M.D..   Procedures: Large Joint Inj: R hip joint on 06/30/2018 11:00 AM Indications: pain and diagnostic evaluation Details: 22 G needle, anterior approach  Arthrogram: Yes  Medications: 80 mg triamcinolone acetonide 40 MG/ML; 3 mL bupivacaine 0.5 % Outcome: tolerated well, no immediate complications  Arthrogram demonstrated excellent flow of contrast throughout the joint surface without extravasation or obvious defect.  The patient had relief of symptoms during the  anesthetic phase of the injection.  Procedure, treatment alternatives, risks and benefits explained, specific risks discussed. Consent was given by the patient. Immediately prior to procedure a time out was called to verify the correct patient, procedure, equipment, support staff and site/side marked as required. Patient was prepped and draped in the usual sterile fashion.      No notes on file   Clinical History: Lumbar MRI  05/30/2016  IMPRESSION: 1. New rightward disc protrusion at L2-3 results in moderate right foraminal narrowing and mild right subarticular stenosis. 2. Broad-based disc protrusion and moderate facet hypertrophy at L3-4 has progressed with mild left foraminal stenosis. 3. Progressive broad-based disc protrusion and moderate facet hypertrophy at L4-5 with moderate central canal stenosis and right greater than left subarticular narrowing. 4. Moderate foraminal narrowing bilaterally at L4-5. 5. Stable left paramedian disc protrusion at L5-S1 with moderate left mild right subarticular stenosis.   Electronically Signed   By: San Morelle M.D.   On: 05/30/2016 14:31   He reports that he has never smoked. He has never used smokeless tobacco. No results for input(s): HGBA1C, LABURIC in the last 8760 hours.  Objective:  VS:  HT:    WT:   BMI:     BP:   HR: bpm  TEMP: ( )  RESP:  Physical Exam  Ortho Exam Imaging: Xr C-arm No Report  Result Date: 06/30/2018 Please see Notes tab for imaging impression.   Past Medical/Family/Surgical/Social History: Medications & Allergies reviewed  per EMR, new medications updated. Patient Active Problem List   Diagnosis Date Noted  . HAV (hallux abducto valgus) 10/20/2014  . Bunion 10/20/2014  . Plantar porokeratosis, acquired 10/20/2014  . Lumbar disc disease with radiculopathy 07/13/2013  . DIAB W/RENAL MANIFESTS TYPE II/UNS TYPE UNCNTRL 10/15/2010  . DYSLIPIDEMIA 10/15/2010  . HYPERTENSION, ESSENTIAL,  UNCONTROLLED 10/15/2010  . MICROALBUMINURIA 10/15/2010  . PERS HX NONCOMPLIANCE W/MED TX PRS HAZARDS HLTH 10/15/2010   Past Medical History:  Diagnosis Date  . Diabetes mellitus without complication ([place])   . Hypertension    Family History  Problem Relation Age of Onset  . Heart disease Mother   . Diabetes Father   . Hypertension Father   . Diabetes Daughter   . Hypertension Daughter    History reviewed. No pertinent surgical history. Social History   Occupational History  . Not on file  Tobacco Use  . Smoking status: Never Smoker  . Smokeless tobacco: Never Used  Substance and Sexual Activity  . Alcohol use: Yes    Alcohol/week: 0.0 standard drinks  . Drug use: Yes    Types: Marijuana  . Sexual activity: Not on file

## 2018-06-30 NOTE — Progress Notes (Signed)
Pt states pain in right hip(groin pain). Pt states numbness in right thigh. Pt states pain started months ago. Pt states laying down makes pain better, working and standing for long periods of time makes pain worse.   .Numeric Pain Rating Scale and Functional Assessment Average Pain 5   In the last MONTH (on 0-10 scale) has pain interfered with the following?  1. General activity like being  able to carry out your everyday physical activities such as walking, climbing stairs, carrying groceries, or moving a chair?  Rating(3)   -Dye Allergies.

## 2018-07-16 ENCOUNTER — Ambulatory Visit (INDEPENDENT_AMBULATORY_CARE_PROVIDER_SITE_OTHER): Payer: Federal, State, Local not specified - PPO | Admitting: Specialist

## 2018-08-19 ENCOUNTER — Ambulatory Visit (INDEPENDENT_AMBULATORY_CARE_PROVIDER_SITE_OTHER): Payer: Federal, State, Local not specified - PPO | Admitting: Specialist

## 2018-08-19 ENCOUNTER — Ambulatory Visit (INDEPENDENT_AMBULATORY_CARE_PROVIDER_SITE_OTHER): Payer: Self-pay

## 2018-08-19 ENCOUNTER — Encounter (INDEPENDENT_AMBULATORY_CARE_PROVIDER_SITE_OTHER): Payer: Self-pay | Admitting: Specialist

## 2018-08-19 VITALS — BP 147/83 | HR 78 | Ht 75.0 in | Wt 300.0 lb

## 2018-08-19 DIAGNOSIS — G8929 Other chronic pain: Secondary | ICD-10-CM

## 2018-08-19 DIAGNOSIS — M5136 Other intervertebral disc degeneration, lumbar region: Secondary | ICD-10-CM | POA: Diagnosis not present

## 2018-08-19 DIAGNOSIS — M533 Sacrococcygeal disorders, not elsewhere classified: Secondary | ICD-10-CM

## 2018-08-19 DIAGNOSIS — M25551 Pain in right hip: Secondary | ICD-10-CM | POA: Diagnosis not present

## 2018-08-19 DIAGNOSIS — M5441 Lumbago with sciatica, right side: Secondary | ICD-10-CM | POA: Diagnosis not present

## 2018-08-19 DIAGNOSIS — M48062 Spinal stenosis, lumbar region with neurogenic claudication: Secondary | ICD-10-CM | POA: Diagnosis not present

## 2018-08-19 DIAGNOSIS — M160 Bilateral primary osteoarthritis of hip: Secondary | ICD-10-CM

## 2018-08-19 DIAGNOSIS — M51369 Other intervertebral disc degeneration, lumbar region without mention of lumbar back pain or lower extremity pain: Secondary | ICD-10-CM

## 2018-08-19 DIAGNOSIS — M25552 Pain in left hip: Secondary | ICD-10-CM

## 2018-08-19 MED ORDER — GABAPENTIN 300 MG PO CAPS: 300.0000 mg | ORAL_CAPSULE | Freq: Every day | ORAL | 3 refills | Status: DC

## 2018-08-19 MED ORDER — DICLOFENAC SODIUM 75 MG PO TBEC: 75.0000 mg | DELAYED_RELEASE_TABLET | Freq: Two times a day (BID) | ORAL | 3 refills | Status: DC

## 2018-08-19 MED ORDER — OXYCODONE-ACETAMINOPHEN 5-325 MG PO TABS: 2.0000 | ORAL_TABLET | Freq: Four times a day (QID) | ORAL | 0 refills | Status: DC | PRN

## 2018-08-19 NOTE — Progress Notes (Signed)
Office Visit Note   Patient: Joshua Case           Date of Birth: [birth year]           MRN: [id number] Visit Date: 08/19/2018              Requested by: [facility] [address], [place] [address] PCP: Pa, [place]: Visit Diagnoses:  1. Pain of both hip joints   2. Spinal stenosis of lumbar region with neurogenic claudication   3. Chronic right-sided low back pain with right-sided sciatica   4. Degenerative disc disease, lumbar   5. Bilateral primary osteoarthritis of hip   6. Sacroiliac joint dysfunction of both sides     Plan: Avoid bending, stooping and avoid lifting weights greater than 10 lbs. Avoid prolong standing and walking. Avoid frequent bending and stooping  No lifting greater than 10 lbs. May use ice or moist heat for pain. Weight loss is of benefit. Handicap license is approved. Dr. Romona Curls secretary/Assistant will call to arrange for epidural steroid injection   Both hips are suffering from osteoarthritis, only real proven treatments are Weight loss, NSIADs like diclofenac and exercise. Well padded shoes help. Ice the hip 2-3 times a day 15-20 mins at a time.  Appointment to see Dr. Ninfa Linden for evaluation for Total hip replacements.  After hips are replaced you may need intervention for lumbar spinal stenosis but right Now I believe the hip symptoms are greater than back. Follow-Up Instructions: Return in about 2 weeks (around 09/02/2018), or See me in follow up in 6-8 weeks. , for See Dr. Ninfa Linden for evaluation for bilateral hip replacement  surgery..   Orders:  Orders Placed This Encounter  Procedures  . XR HIPS BILAT W OR W/O PELVIS 3-4 VIEWS   No orders of the defined types were placed in this encounter.     Procedures: No procedures performed   Clinical Data: No additional findings.   Subjective: Chief Complaint  Patient presents with  .  Right Hip - Follow-up    Had right hip injection with Dr. Ernestina Patches on 06/30/18-says it helped  . Left Hip - Follow-up    Had left hip injection on 1016/19 with Dr. Ernestina Patches and he says it helped him    57 year old male with history of lumbar spondylosis and spinal stenosis. No listhesis. Pain is worsening as the day goes on, with numbness and tingling into both leg the anterior thighs. He had MRI in 05/2016 with moderate stenosis at L3-4 and L4-5. He also has difficulty reaching his socks and shoes and with stair climbing.    Review of Systems  Constitutional: Negative.   HENT: Negative.   Eyes: Negative.   Respiratory: Negative.   Cardiovascular: Negative.   Gastrointestinal: Negative.   Endocrine: Negative.   Genitourinary: Negative.   Musculoskeletal: Negative.   Skin: Negative.   Allergic/Immunologic: Negative.   Neurological: Negative.   Hematological: Negative.   Psychiatric/Behavioral: Negative.      Objective: Vital Signs: BP (!) 147/83 (BP Location: Left Arm, Patient Position: Sitting)   Pulse 78   Ht 6\' 3"  (1.905 m)   Wt 300 lb (136.1 kg)   BMI 37.50 kg/m   Physical Exam Constitutional:      Appearance: He is well-developed.  HENT:     Head: Normocephalic and atraumatic.  Eyes:     Pupils: Pupils are equal, round, and  reactive to light.  Neck:     Musculoskeletal: Normal range of motion and neck supple.  Pulmonary:     Effort: Pulmonary effort is normal.     Breath sounds: Normal breath sounds.  Abdominal:     General: Bowel sounds are normal.     Palpations: Abdomen is soft.  Skin:    General: Skin is warm and dry.  Neurological:     Mental Status: He is alert and oriented to person, place, and time.  Psychiatric:        Behavior: Behavior normal.        Thought Content: Thought content normal.        Judgment: Judgment normal.     Back Exam   Tenderness  The patient is experiencing tenderness in the lumbar.  Range of Motion  Extension:  abnormal  Flexion: abnormal  Lateral bend right: normal  Lateral bend left: normal  Rotation right: normal  Rotation left: normal   Muscle Strength  Right Quadriceps:  5/5  Left Quadriceps:  5/5  Right Hamstrings:  5/5  Left Hamstrings:  5/5   Tests  Straight leg raise right: negative Straight leg raise left: negative  Reflexes  Patellar: 0/4 Achilles: 0/4 Babinski's sign: normal   Other  Toe walk: normal Heel walk: normal Sensation: normal Gait: normal  Erythema: no back redness Scars: absent      Specialty Comments:  No specialty comments available.  Imaging: No results found.   PMFS History: Patient Active Problem List   Diagnosis Date Noted  . HAV (hallux abducto valgus) 10/20/2014  . Bunion 10/20/2014  . Plantar porokeratosis, acquired 10/20/2014  . Lumbar disc disease with radiculopathy 07/13/2013  . DIAB W/RENAL MANIFESTS TYPE II/UNS TYPE UNCNTRL 10/15/2010  . DYSLIPIDEMIA 10/15/2010  . HYPERTENSION, ESSENTIAL, UNCONTROLLED 10/15/2010  . MICROALBUMINURIA 10/15/2010  . PERS HX NONCOMPLIANCE W/MED TX PRS HAZARDS HLTH 10/15/2010   Past Medical History:  Diagnosis Date  . Diabetes mellitus without complication ([place])   . Hypertension     Family History  Problem Relation Age of Onset  . Heart disease Mother   . Diabetes Father   . Hypertension Father   . Diabetes Daughter   . Hypertension Daughter     History reviewed. No pertinent surgical history. Social History   Occupational History  . Not on file  Tobacco Use  . Smoking status: Never Smoker  . Smokeless tobacco: Never Used  Substance and Sexual Activity  . Alcohol use: Yes    Alcohol/week: 0.0 standard drinks  . Drug use: Yes    Types: Marijuana  . Sexual activity: Not on file

## 2018-08-19 NOTE — Patient Instructions (Signed)
Plan: Avoid bending, stooping and avoid lifting weights greater than 10 lbs. Avoid prolong standing and walking. Avoid frequent bending and stooping  No lifting greater than 10 lbs. May use ice or moist heat for pain. Weight loss is of benefit. Handicap license is approved. Dr. Romona Curls secretary/Assistant will call to arrange for epidural steroid injection   Both hips are suffering from osteoarthritis, only real proven treatments are Weight loss, NSIADs like diclofenac and exercise. Well padded shoes help. Ice the hip 2-3 times a day 15-20 mins at a time.  Appointment to see Dr. Ninfa Linden for evaluation for Total hip replacements.  After hips are replaced you may need intervention for lumbar spinal stenosis but right Now I believe the hip symptoms are greater than back

## 2018-09-15 ENCOUNTER — Ambulatory Visit (INDEPENDENT_AMBULATORY_CARE_PROVIDER_SITE_OTHER): Payer: Federal, State, Local not specified - PPO | Admitting: Orthopaedic Surgery

## 2018-09-21 ENCOUNTER — Ambulatory Visit (INDEPENDENT_AMBULATORY_CARE_PROVIDER_SITE_OTHER): Payer: Federal, State, Local not specified - PPO | Admitting: Orthopaedic Surgery

## 2018-10-01 ENCOUNTER — Ambulatory Visit (INDEPENDENT_AMBULATORY_CARE_PROVIDER_SITE_OTHER): Payer: Federal, State, Local not specified - PPO | Admitting: Specialist

## 2018-10-29 ENCOUNTER — Ambulatory Visit (INDEPENDENT_AMBULATORY_CARE_PROVIDER_SITE_OTHER): Payer: Federal, State, Local not specified - PPO | Admitting: Specialist

## 2019-04-27 ENCOUNTER — Ambulatory Visit (INDEPENDENT_AMBULATORY_CARE_PROVIDER_SITE_OTHER): Payer: Federal, State, Local not specified - PPO | Admitting: Specialist

## 2019-08-04 ENCOUNTER — Ambulatory Visit: Payer: Federal, State, Local not specified - PPO | Admitting: Specialist

## 2021-01-03 ENCOUNTER — Encounter: Payer: Self-pay | Admitting: Specialist

## 2021-01-03 ENCOUNTER — Other Ambulatory Visit: Payer: Self-pay

## 2021-01-03 ENCOUNTER — Ambulatory Visit: Payer: Self-pay

## 2021-01-03 ENCOUNTER — Ambulatory Visit: Payer: Federal, State, Local not specified - PPO | Admitting: Specialist

## 2021-01-03 VITALS — BP 197/95 | HR 73 | Ht 73.0 in | Wt 309.4 lb

## 2021-01-03 DIAGNOSIS — M5136 Other intervertebral disc degeneration, lumbar region: Secondary | ICD-10-CM | POA: Diagnosis not present

## 2021-01-03 DIAGNOSIS — M4815 Ankylosing hyperostosis [Forestier], thoracolumbar region: Secondary | ICD-10-CM

## 2021-01-03 DIAGNOSIS — M5116 Intervertebral disc disorders with radiculopathy, lumbar region: Secondary | ICD-10-CM | POA: Diagnosis not present

## 2021-01-03 DIAGNOSIS — M4807 Spinal stenosis, lumbosacral region: Secondary | ICD-10-CM | POA: Diagnosis not present

## 2021-01-03 DIAGNOSIS — M51369 Other intervertebral disc degeneration, lumbar region without mention of lumbar back pain or lower extremity pain: Secondary | ICD-10-CM

## 2021-01-03 MED ORDER — DICLOFENAC SODIUM 75 MG PO TBEC: 75.0000 mg | DELAYED_RELEASE_TABLET | Freq: Three times a day (TID) | ORAL | 3 refills | Status: DC

## 2021-01-03 NOTE — Progress Notes (Signed)
Office Visit Note   Patient: Joshua Case           Date of Birth: 1960/12/08           MRN: [id number] Visit Date: 01/03/2021              Requested by: [facility] [address] [place],  [place] [address] PCP: Pa, [place]: Visit Diagnoses:  1. Lumbar disc disease with radiculopathy   2. Other intervertebral disc degeneration, lumbar region   3. Spinal stenosis of lumbosacral region     Plan: Avoid bending, stooping and avoid lifting weights greater than 10 lbs. Avoid prolong standing and walking. Avoid frequent bending and stooping  No lifting greater than 10 lbs. May use ice or moist heat for pain. Weight loss is of benefit. Handicap license is approved. MRI of the lumbar spine is ordered, previous studies showed moderately severe stenosis L4-5.  Should see Dr. Ninfa Linden to assess his hips and consider bilateral THR.  Diclofenac for arthritis pain.  Follow-Up Instructions: No follow-ups on file.   Orders:  Orders Placed This Encounter  Procedures  . XR Lumbar Spine 2-3 Views   No orders of the defined types were placed in this encounter.     Procedures: No procedures performed   Clinical Data: No additional findings.   Subjective: Chief Complaint  Patient presents with  . Lower Back - Pain    60 year old male with history of back and bilateral thigh and calf pain with standing and walking. Radiographs with DISH andDDD L4-5 and L5-S1. He reports pain into the legs with feeling of fatique with walking to his car at work. He works for the [employer] near [employer].    Review of Systems  Constitutional: Negative.   HENT: Negative.   Eyes: Negative.   Respiratory: Negative.   Cardiovascular: Negative.   Gastrointestinal: Negative.   Endocrine: Negative.   Genitourinary: Negative.   Musculoskeletal: Negative.   Skin: Negative.   Allergic/Immunologic: Negative.    Neurological: Negative.   Hematological: Negative.   Psychiatric/Behavioral: Negative.      Objective: Vital Signs: BP (!) 197/95   Pulse 73   Ht 6\' 1"  (1.854 m)   Wt (!) 309 lb 6.4 oz (140.3 kg)   BMI 40.82 kg/m   Physical Exam Constitutional:      Appearance: He is well-developed.  HENT:     Head: Normocephalic and atraumatic.  Eyes:     Pupils: Pupils are equal, round, and reactive to light.  Pulmonary:     Effort: Pulmonary effort is normal.     Breath sounds: Normal breath sounds.  Abdominal:     General: Bowel sounds are normal.     Palpations: Abdomen is soft.  Musculoskeletal:     Cervical back: Normal range of motion and neck supple.  Skin:    General: Skin is warm and dry.  Neurological:     Mental Status: He is alert and oriented to person, place, and time.  Psychiatric:        Behavior: Behavior normal.        Thought Content: Thought content normal.        Judgment: Judgment normal.     Back Exam   Tenderness  The patient is experiencing tenderness in the lumbar.  Range of Motion  Extension: abnormal  Flexion: normal  Lateral bend right: normal  Lateral  bend left: normal  Rotation right: normal  Rotation left: normal   Muscle Strength  Right Quadriceps:  5/5  Left Quadriceps:  5/5  Right Hamstrings:  5/5  Left Hamstrings:  5/5   Other  Erythema: no back redness Scars: absent  Comments:  DF is weak 4/5 bilaterally and EHL is weak 4/5 bilaterally, he stands on concrete most of the day.       Specialty Comments:  No specialty comments available.  Imaging: No results found.   PMFS History: Patient Active Problem List   Diagnosis Date Noted  . HAV (hallux abducto valgus) 10/20/2014  . Bunion 10/20/2014  . Plantar porokeratosis, acquired 10/20/2014  . Lumbar disc disease with radiculopathy 07/13/2013  . DIAB W/RENAL MANIFESTS TYPE II/UNS TYPE UNCNTRL 10/15/2010  . DYSLIPIDEMIA 10/15/2010  . HYPERTENSION, ESSENTIAL,  UNCONTROLLED 10/15/2010  . MICROALBUMINURIA 10/15/2010  . PERS HX NONCOMPLIANCE W/MED TX PRS HAZARDS HLTH 10/15/2010   Past Medical History:  Diagnosis Date  . Diabetes mellitus without complication ([place])   . Hypertension     Family History  Problem Relation Age of Onset  . Heart disease Mother   . Diabetes Father   . Hypertension Father   . Diabetes Daughter   . Hypertension Daughter     History reviewed. No pertinent surgical history. Social History   Occupational History  . Not on file  Tobacco Use  . Smoking status: Never Smoker  . Smokeless tobacco: Never Used  Substance and Sexual Activity  . Alcohol use: Yes    Alcohol/week: 0.0 standard drinks  . Drug use: Yes    Types: Marijuana  . Sexual activity: Not on file

## 2021-01-03 NOTE — Patient Instructions (Signed)
Plan: Avoid bending, stooping and avoid lifting weights greater than 10 lbs. Avoid prolong standing and walking. Avoid frequent bending and stooping  No lifting greater than 10 lbs. May use ice or moist heat for pain. Weight loss is of benefit. Handicap license is approved. MRI of the lumbar spine is ordered, previous studies showed moderately severe stenosis L4-5.  Should see Dr. Ninfa Linden to assess his hips and consider bilateral THR.  Diclofenac for arthritis pain.

## 2021-01-04 ENCOUNTER — Encounter: Payer: Self-pay | Admitting: Radiology

## 2021-01-04 ENCOUNTER — Telehealth: Payer: Self-pay | Admitting: Specialist

## 2021-01-04 NOTE — Telephone Encounter (Signed)
Ciox advised. 

## 2021-01-04 NOTE — Telephone Encounter (Signed)
Note in chart, Thank you

## 2021-01-04 NOTE — Telephone Encounter (Signed)
Please advise work status & provide work note. (pt submitted FMLA forms). Thanks!

## 2021-02-01 ENCOUNTER — Ambulatory Visit: Payer: Federal, State, Local not specified - PPO | Admitting: Specialist

## 2021-02-13 ENCOUNTER — Ambulatory Visit
Admission: RE | Admit: 2021-02-13 | Discharge: 2021-02-13 | Disposition: A | Discharge status: Home / Self Care | Payer: Federal, State, Local not specified - PPO | Source: Ambulatory Visit | Attending: Specialist | Admitting: Specialist

## 2021-02-13 ENCOUNTER — Other Ambulatory Visit: Payer: Self-pay

## 2021-02-13 DIAGNOSIS — M4807 Spinal stenosis, lumbosacral region: Secondary | ICD-10-CM

## 2021-02-13 DIAGNOSIS — M5136 Other intervertebral disc degeneration, lumbar region: Secondary | ICD-10-CM

## 2021-02-15 ENCOUNTER — Other Ambulatory Visit: Payer: Self-pay

## 2021-02-15 ENCOUNTER — Ambulatory Visit (INDEPENDENT_AMBULATORY_CARE_PROVIDER_SITE_OTHER): Payer: Federal, State, Local not specified - PPO | Admitting: Specialist

## 2021-02-15 ENCOUNTER — Encounter: Payer: Self-pay | Admitting: Specialist

## 2021-02-15 VITALS — BP 159/95 | HR 89 | Ht 73.0 in | Wt 309.0 lb

## 2021-02-15 DIAGNOSIS — M5136 Other intervertebral disc degeneration, lumbar region: Secondary | ICD-10-CM | POA: Diagnosis not present

## 2021-02-15 DIAGNOSIS — G8929 Other chronic pain: Secondary | ICD-10-CM

## 2021-02-15 DIAGNOSIS — R200 Anesthesia of skin: Secondary | ICD-10-CM

## 2021-02-15 DIAGNOSIS — M533 Sacrococcygeal disorders, not elsewhere classified: Secondary | ICD-10-CM

## 2021-02-15 DIAGNOSIS — M48062 Spinal stenosis, lumbar region with neurogenic claudication: Secondary | ICD-10-CM

## 2021-02-15 DIAGNOSIS — R202 Paresthesia of skin: Secondary | ICD-10-CM

## 2021-02-15 DIAGNOSIS — M5441 Lumbago with sciatica, right side: Secondary | ICD-10-CM | POA: Diagnosis not present

## 2021-02-15 DIAGNOSIS — M160 Bilateral primary osteoarthritis of hip: Secondary | ICD-10-CM | POA: Diagnosis not present

## 2021-02-15 MED ORDER — GABAPENTIN 300 MG PO CAPS: 300.0000 mg | ORAL_CAPSULE | Freq: Every day | ORAL | 3 refills | Status: AC

## 2021-02-15 MED ORDER — DICLOFENAC SODIUM 75 MG PO TBEC: 75.0000 mg | DELAYED_RELEASE_TABLET | Freq: Three times a day (TID) | ORAL | 3 refills | Status: DC

## 2021-02-15 NOTE — Progress Notes (Signed)
Office Visit Note   Patient: Joshua Case           Date of Birth: 04-11-1961           MRN: [id number] Visit Date: 02/15/2021              Requested by: [facility] [address] [place],  [place] [address] PCP: Pa, [place]: Visit Diagnoses:  1. Numbness and tingling of right arm   2. Chronic right-sided low back pain with right-sided sciatica   3. Degenerative disc disease, lumbar   4. Bilateral primary osteoarthritis of hip   5. Sacroiliac joint dysfunction of both sides   6. Spinal stenosis of lumbar region with neurogenic claudication     Plan: Avoid overhead lifting and overhead use of the arms. Do not lift greater than 5 lbs. Adjust head rest in vehicle to prevent hyperextension if rear ended. Take extra precautions to avoid falling. Schedule for EMG/NCV of the right arm to assess for cervical radiculopathy. Avoid bending, stooping and avoid lifting weights greater than 10 lbs. Avoid prolong standing and walking. Avoid frequent bending and stooping  No lifting greater than 10 lbs. May use ice or moist heat for pain. Weight loss is of benefit. Handicap license is approved. Dr. Romona Curls secretary/Assistant will call to arrange for epidural steroid injection   Neurology evaluation for right arm numbness  Follow-Up Instructions: Return in about 4 weeks (around 03/15/2021).   Orders:  Orders Placed This Encounter  Procedures   Ambulatory referral to Neurology   Ambulatory referral to Physical Medicine Rehab   Meds ordered this encounter  Medications   gabapentin (NEURONTIN) 300 MG capsule    Sig: Take 1 capsule (300 mg total) by mouth at bedtime.    Dispense:  30 capsule    Refill:  3   diclofenac (VOLTAREN) 75 MG EC tablet    Sig: Take 1 tablet (75 mg total) by mouth 3 (three) times daily.    Dispense:  90 tablet    Refill:  3      Procedures: No procedures  performed   Clinical Data: Findings:  CLINICAL DATA: Chronic low back pain with radiculopathy  EXAM: MRI LUMBAR SPINE WITHOUT CONTRAST  TECHNIQUE: Multiplanar, multisequence MR imaging of the lumbar spine was performed. No intravenous contrast was administered.  COMPARISON: X-ray 01/03/2021, MRI 05/30/2016  FINDINGS: Segmentation: Standard.  Alignment: Mild dextrocurvature. No static listhesis.  Vertebrae: No fracture, evidence of discitis, or bone lesion. Mild discogenic endplate marrow changes of L2-3.  Conus medullaris and cauda equina: Conus extends to the L1-2 level. Conus appears normal. Bunching of the cauda equina nerve roots above the L2-3 level of stenosis.  Paraspinal and other soft tissues: Negative.  Disc levels:  T12-L1: No significant disc protrusion, foraminal stenosis, or canal stenosis. Unchanged.  L1-L2: Minimal circumferential disc bulge. No foraminal or canal stenosis. Unchanged.  L2-L3: Circumferential disc bulge with mild bilateral facet arthropathy and prominent ligamentum flavum buckling. Findings result in moderate to severe canal stenosis with bilateral subarticular recess stenosis. Moderate to severe bilateral foraminal stenosis. Findings progressed from prior.  L3-L4: Circumferential disc bulge with mild bilateral facet arthropathy and ligamentum flavum buckling. Findings contribute to mild canal stenosis with mild left greater than right foraminal stenosis. Findings progressed from prior.  L4-L5: Chronic disc height loss with circumferential disc osteophyte complex. Advanced bilateral facet arthropathy and ligamentum flavum buckling. Findings contribute to moderate  canal stenosis with severe bilateral foraminal stenosis. Findings progressed from prior.  L5-S1: Chronic disc height loss with disc osteophyte complex with prominent left subarticular component. Bilateral facet arthropathy. Severe left subarticular recess stenosis  without canal stenosis. Mild bilateral foraminal stenosis. Findings are similar to prior.  IMPRESSION: 1. Multilevel lumbar spondylosis, progressed from prior MRI. 2. Moderate-to-severe canal stenosis and bilateral subarticular recess stenosis at L2-3 with moderate-to-severe bilateral foraminal stenosis. 3. Moderate canal stenosis with severe bilateral foraminal stenosis at L4-5. 4. Severe left subarticular recess stenosis at L5-S1.   Electronically Signed By: Davina Poke D.O. On: 02/13/2021 15:59   Subjective: Chief Complaint  Patient presents with   Lower Back - Follow-up    Says that he is hurting on the left side    60 year old male with history of lumbar pain and difficulty with prolong standing and walking. He has stiffness into both hips and plain radiographs demonstrate bilateral moderately severe OA of the hips. His pain is in the back but he also is limited in standing and feels better lean on a cart when at the grocery store or out shopping. No bowel or bladder dysfunction. He can walk a mile but with dyscomfort in the back and with leg weakness afterwards. There is some left leg tingling and numbness into the left lower leg. The sciatica is better lately.  Review of Systems  Constitutional: Negative.   HENT: Negative.    Eyes: Negative.   Respiratory: Negative.    Cardiovascular: Negative.   Gastrointestinal: Negative.   Endocrine: Negative.   Genitourinary: Negative.   Musculoskeletal: Negative.   Skin: Negative.   Allergic/Immunologic: Negative.   Neurological: Negative.   Hematological: Negative.   Psychiatric/Behavioral: Negative.      Objective: Vital Signs: BP (!) 159/95 (BP Location: Left Arm, Patient Position: Sitting)   Pulse 89   Ht 6\' 1"  (1.854 m)   Wt (!) 309 lb (140.2 kg)   BMI 40.77 kg/m   Physical Exam  Ortho Exam  Specialty Comments:  No specialty comments available.  Imaging: No results found.   PMFS History: Patient  Active Problem List   Diagnosis Date Noted   HAV (hallux abducto valgus) 10/20/2014   Bunion 10/20/2014   Plantar porokeratosis, acquired 10/20/2014   Lumbar disc disease with radiculopathy 07/13/2013   DIAB W/RENAL MANIFESTS TYPE II/UNS TYPE UNCNTRL 10/15/2010   DYSLIPIDEMIA 10/15/2010   HYPERTENSION, ESSENTIAL, UNCONTROLLED 10/15/2010   MICROALBUMINURIA 10/15/2010   PERS HX NONCOMPLIANCE W/MED TX PRS HAZARDS HLTH 10/15/2010   Past Medical History:  Diagnosis Date   Diabetes mellitus without complication ([place])    Hypertension     Family History  Problem Relation Age of Onset   Heart disease Mother    Diabetes Father    Hypertension Father    Diabetes Daughter    Hypertension Daughter     History reviewed. No pertinent surgical history. Social History   Occupational History   Not on file  Tobacco Use   Smoking status: Never   Smokeless tobacco: Never  Substance and Sexual Activity   Alcohol use: Yes    Alcohol/week: 0.0 standard drinks   Drug use: Yes    Types: Marijuana   Sexual activity: Not on file

## 2021-02-15 NOTE — Patient Instructions (Signed)
Avoid overhead lifting and overhead use of the arms. Do not lift greater than 5 lbs. Adjust head rest in vehicle to prevent hyperextension if rear ended. Take extra precautions to avoid falling.  

## 2021-03-11 ENCOUNTER — Ambulatory Visit: Payer: Federal, State, Local not specified - PPO | Admitting: Orthopaedic Surgery

## 2021-03-18 ENCOUNTER — Ambulatory Visit: Payer: Federal, State, Local not specified - PPO | Admitting: Orthopaedic Surgery

## 2021-03-28 ENCOUNTER — Ambulatory Visit: Payer: Federal, State, Local not specified - PPO | Admitting: Specialist

## 2021-04-01 DIAGNOSIS — I639 Cerebral infarction, unspecified: Secondary | ICD-10-CM

## 2021-04-01 HISTORY — DX: Cerebral infarction, unspecified: I63.9

## 2021-04-05 ENCOUNTER — Emergency Department (HOSPITAL_COMMUNITY): Payer: Federal, State, Local not specified - PPO

## 2021-04-05 ENCOUNTER — Other Ambulatory Visit: Payer: Self-pay

## 2021-04-05 ENCOUNTER — Inpatient Hospital Stay (HOSPITAL_COMMUNITY)
Admission: EM | Admit: 2021-04-05 | Discharge: 2021-04-08 | DRG: 065 | Disposition: A | Discharge status: Home Health | Payer: Federal, State, Local not specified - PPO | Attending: Family Medicine | Admitting: Family Medicine

## 2021-04-05 ENCOUNTER — Encounter (HOSPITAL_COMMUNITY): Payer: Self-pay | Admitting: Pharmacy Technician

## 2021-04-05 DIAGNOSIS — I639 Cerebral infarction, unspecified: Secondary | ICD-10-CM | POA: Diagnosis present

## 2021-04-05 DIAGNOSIS — E236 Other disorders of pituitary gland: Secondary | ICD-10-CM | POA: Diagnosis present

## 2021-04-05 DIAGNOSIS — E785 Hyperlipidemia, unspecified: Secondary | ICD-10-CM | POA: Diagnosis present

## 2021-04-05 DIAGNOSIS — Z8249 Family history of ischemic heart disease and other diseases of the circulatory system: Secondary | ICD-10-CM

## 2021-04-05 DIAGNOSIS — I129 Hypertensive chronic kidney disease with stage 1 through stage 4 chronic kidney disease, or unspecified chronic kidney disease: Secondary | ICD-10-CM | POA: Diagnosis present

## 2021-04-05 DIAGNOSIS — Y92009 Unspecified place in unspecified non-institutional (private) residence as the place of occurrence of the external cause: Secondary | ICD-10-CM | POA: Diagnosis not present

## 2021-04-05 DIAGNOSIS — G894 Chronic pain syndrome: Secondary | ICD-10-CM | POA: Diagnosis present

## 2021-04-05 DIAGNOSIS — F1210 Cannabis abuse, uncomplicated: Secondary | ICD-10-CM | POA: Diagnosis present

## 2021-04-05 DIAGNOSIS — Z6838 Body mass index (BMI) 38.0-38.9, adult: Secondary | ICD-10-CM

## 2021-04-05 DIAGNOSIS — R270 Ataxia, unspecified: Secondary | ICD-10-CM | POA: Diagnosis present

## 2021-04-05 DIAGNOSIS — E119 Type 2 diabetes mellitus without complications: Secondary | ICD-10-CM

## 2021-04-05 DIAGNOSIS — E1122 Type 2 diabetes mellitus with diabetic chronic kidney disease: Secondary | ICD-10-CM | POA: Diagnosis present

## 2021-04-05 DIAGNOSIS — Z794 Long term (current) use of insulin: Secondary | ICD-10-CM

## 2021-04-05 DIAGNOSIS — D631 Anemia in chronic kidney disease: Secondary | ICD-10-CM | POA: Diagnosis present

## 2021-04-05 DIAGNOSIS — E669 Obesity, unspecified: Secondary | ICD-10-CM | POA: Diagnosis present

## 2021-04-05 DIAGNOSIS — R0600 Dyspnea, unspecified: Secondary | ICD-10-CM | POA: Diagnosis not present

## 2021-04-05 DIAGNOSIS — E11649 Type 2 diabetes mellitus with hypoglycemia without coma: Secondary | ICD-10-CM | POA: Diagnosis present

## 2021-04-05 DIAGNOSIS — N179 Acute kidney failure, unspecified: Secondary | ICD-10-CM | POA: Diagnosis present

## 2021-04-05 DIAGNOSIS — T39395A Adverse effect of other nonsteroidal anti-inflammatory drugs [NSAID], initial encounter: Secondary | ICD-10-CM | POA: Diagnosis present

## 2021-04-05 DIAGNOSIS — Z7984 Long term (current) use of oral hypoglycemic drugs: Secondary | ICD-10-CM

## 2021-04-05 DIAGNOSIS — N184 Chronic kidney disease, stage 4 (severe): Secondary | ICD-10-CM | POA: Diagnosis present

## 2021-04-05 DIAGNOSIS — I6389 Other cerebral infarction: Secondary | ICD-10-CM | POA: Diagnosis present

## 2021-04-05 DIAGNOSIS — R29702 NIHSS score 2: Secondary | ICD-10-CM | POA: Diagnosis present

## 2021-04-05 DIAGNOSIS — R0609 Other forms of dyspnea: Secondary | ICD-10-CM | POA: Diagnosis present

## 2021-04-05 DIAGNOSIS — Z20822 Contact with and (suspected) exposure to covid-19: Secondary | ICD-10-CM | POA: Diagnosis present

## 2021-04-05 DIAGNOSIS — Z833 Family history of diabetes mellitus: Secondary | ICD-10-CM | POA: Diagnosis not present

## 2021-04-05 DIAGNOSIS — I161 Hypertensive emergency: Secondary | ICD-10-CM | POA: Diagnosis present

## 2021-04-05 DIAGNOSIS — Z9114 Patient's other noncompliance with medication regimen: Secondary | ICD-10-CM

## 2021-04-05 DIAGNOSIS — R8271 Bacteriuria: Secondary | ICD-10-CM | POA: Diagnosis present

## 2021-04-05 DIAGNOSIS — Z79899 Other long term (current) drug therapy: Secondary | ICD-10-CM

## 2021-04-05 DIAGNOSIS — Z888 Allergy status to other drugs, medicaments and biological substances status: Secondary | ICD-10-CM

## 2021-04-05 DIAGNOSIS — R29810 Facial weakness: Secondary | ICD-10-CM | POA: Diagnosis present

## 2021-04-05 DIAGNOSIS — G8191 Hemiplegia, unspecified affecting right dominant side: Secondary | ICD-10-CM | POA: Diagnosis present

## 2021-04-05 DIAGNOSIS — E1159 Type 2 diabetes mellitus with other circulatory complications: Secondary | ICD-10-CM | POA: Diagnosis not present

## 2021-04-05 LAB — ETHANOL: Alcohol, Ethyl (B): <10 mg/dL (ref <10)

## 2021-04-05 LAB — CBC WITH DIFFERENTIAL/PLATELET
Abs Immature Granulocytes: 0.02 10*3/uL (ref 0.00–0.07)
Basophils Absolute: 0.1 10*3/uL (ref 0.0–0.1)
Basophils Relative: 1 %
Eosinophils Absolute: 0.3 10*3/uL (ref 0.0–0.5)
Eosinophils Relative: 4 %
HCT: 38.1 % — ABNORMAL LOW (ref 39.0–52.0)
Hemoglobin: 11.9 g/dL — ABNORMAL LOW (ref 13.0–17.0)
Immature Granulocytes: 0 %
Lymphocytes Relative: 33 %
Lymphs Abs: 2.4 10*3/uL (ref 0.7–4.0)
MCH: 27.4 pg (ref 26.0–34.0)
MCHC: 31.2 g/dL (ref 30.0–36.0)
MCV: 87.8 fL (ref 80.0–100.0)
Monocytes Absolute: 0.5 10*3/uL (ref 0.1–1.0)
Monocytes Relative: 7 %
Neutro Abs: 3.9 10*3/uL (ref 1.7–7.7)
Neutrophils Relative %: 55 %
Platelets: 289 10*3/uL (ref 150–400)
RBC: 4.34 MIL/uL (ref 4.22–5.81)
RDW: 15.4 % (ref 11.5–15.5)
WBC: 7.1 10*3/uL (ref 4.0–10.5)
nRBC: 0 % (ref 0.0–0.2)

## 2021-04-05 LAB — RESP PANEL BY RT-PCR (FLU A&B, COVID) ARPGX2
Influenza A by PCR: NEGATIVE
Influenza B by PCR: NEGATIVE
SARS Coronavirus 2 by RT PCR: NEGATIVE

## 2021-04-05 LAB — URINALYSIS, ROUTINE W REFLEX MICROSCOPIC
Bilirubin Urine: NEGATIVE
Glucose, UA: NEGATIVE mg/dL
Ketones, ur: NEGATIVE mg/dL
Nitrite: NEGATIVE
Protein, ur: >=300 mg/dL — AB
Specific Gravity, Urine: 1.018 (ref 1.005–1.030)
pH: 5 (ref 5.0–8.0)

## 2021-04-05 LAB — COMPREHENSIVE METABOLIC PANEL
ALT: 15 U/L (ref 0–44)
AST: 16 U/L (ref 15–41)
Albumin: 3 g/dL — ABNORMAL LOW (ref 3.5–5.0)
Alkaline Phosphatase: 66 U/L (ref 38–126)
Anion gap: 8 (ref 5–15)
BUN: 34 mg/dL — ABNORMAL HIGH (ref 6–20)
CO2: 21 mmol/L — ABNORMAL LOW (ref 22–32)
Calcium: 8.6 mg/dL — ABNORMAL LOW (ref 8.9–10.3)
Chloride: 111 mmol/L (ref 98–111)
Creatinine, Ser: 3.14 mg/dL — ABNORMAL HIGH (ref 0.61–1.24)
GFR, Estimated: 22 mL/min — ABNORMAL LOW (ref >60)
Glucose, Bld: 61 mg/dL — ABNORMAL LOW (ref 70–99)
Potassium: 4.5 mmol/L (ref 3.5–5.1)
Sodium: 140 mmol/L (ref 135–145)
Total Bilirubin: 0.7 mg/dL (ref 0.3–1.2)
Total Protein: 6.6 g/dL (ref 6.5–8.1)

## 2021-04-05 LAB — RAPID URINE DRUG SCREEN, HOSP PERFORMED
Amphetamines: NOT DETECTED
Barbiturates: NOT DETECTED
Benzodiazepines: NOT DETECTED
Cocaine: NOT DETECTED
Opiates: NOT DETECTED
Tetrahydrocannabinol: POSITIVE — AB

## 2021-04-05 LAB — PROTIME-INR
INR: 1 (ref 0.8–1.2)
Prothrombin Time: 13.3 seconds (ref 11.4–15.2)

## 2021-04-05 LAB — APTT: aPTT: 28 seconds (ref 24–36)

## 2021-04-05 LAB — CBG MONITORING, ED: Glucose-Capillary: 49 mg/dL — ABNORMAL LOW (ref 70–99)

## 2021-04-05 MED ORDER — HYDRALAZINE HCL 20 MG/ML IJ SOLN: 5.0000 mg | INTRAMUSCULAR | Status: DC | PRN

## 2021-04-05 NOTE — ED Triage Notes (Signed)
Pt here with R sided weakness, R facial droop since Tuesday. Hx DM, hypertension.

## 2021-04-05 NOTE — Consult Note (Signed)
Neurology Consultation Reason for Consult: Right-sided weakness Referring Physician: Marlowe Sax, V  CC: Right-sided weakness  History is obtained from: Patient  HPI: Joshua Case is a 60 y.o. male who began having right-sided weakness last Tuesday.  He states that he might be slightly better today.  Due to continuing to have symptoms, his daughter finally made him come in to get checked out where an MRI was performed revealing an ischemic infarct in the left cerebral peduncle.  It also shows significant ischemic disease including old small strokes.  He denies sensory change, double vision.  He endorses some difficulty walking.  LKW: Tuesday tpa given?: no, outside of window   ROS: A 14 point ROS was performed and is negative except as noted in the HPI.   Past Medical History:  Diagnosis Date   Diabetes mellitus without complication ([place])    Hypertension      Family History  Problem Relation Age of Onset   Heart disease Mother    Diabetes Father    Hypertension Father    Diabetes Daughter    Hypertension Daughter      Social History:  reports that he has never smoked. He has never used smokeless tobacco. He reports current alcohol use. He reports current drug use. Drug: Marijuana.   Exam: Current vital signs: BP (!) 170/103   Pulse (!) 57   Resp 12   SpO2 100%  Vital signs in last 24 hours: Pulse Rate:  [55-57] 57 (08/05 2130) Resp:  [12-19] 12 (08/05 2130) BP: (143-183)/(65-103) 170/103 (08/05 2130) SpO2:  [100 %] 100 % (08/05 2130)   Physical Exam  Constitutional: Appears well-developed and well-nourished.  Psych: Affect appropriate to situation Eyes: No scleral injection HENT: No OP obstruction MSK: no joint deformities.  Cardiovascular: Normal rate and regular rhythm.  Respiratory: Effort normal, non-labored breathing GI: Soft.  No distension. There is no tenderness.  Skin: WDI  Neuro: Mental Status: Patient is awake, alert, oriented to person, place,  month, year, and situation. Patient is able to give a clear and coherent history. No signs of aphasia or neglect Cranial Nerves: II: Visual Fields are full. Pupils are equal, round, and reactive to light.   III,IV, VI: EOMI without ptosis or diploplia.  V: Facial sensation is symmetric to temperature VII: Facial movement with right facial weakness VIII: hearing is intact to voice X: Uvula elevates symmetrically XI: Shoulder shrug is symmetric. XII: tongue is midline without atrophy or fasciculations.  Motor: Tone is normal. Bulk is normal. 5/5 strength was present on the left, 4/5 on the right Sensory: Sensation is symmetric to light touch and temperature in the arms and legs. Cerebellar: FNF and HKS are ataxic on the right   I have reviewed labs in epic and the results pertinent to this consultation are: Creatinine 3.14 CBC-unremarkable  I have reviewed the images obtained: MRI brain - left cerebral peduncle stroke  Impression: 60 year old male with what is likely a small vessel ischemic stroke in the brainstem.  He will need to be admitted for secondary risk factor modification and physical therapy.  I would favor dual antiplatelet therapy for 3 weeks followed by monotherapy.  Recommendations: - HgbA1c, fasting lipid panel - MRA head and neck - Frequent neuro checks - Echocardiogram - Prophylactic therapy-Antiplatelet med: Aspirin - dose 81mg  daily and 75mg  after 300mg  load - Risk factor modification - Telemetry monitoring - PT consult, OT consult, Speech consult - Stroke team to follow   Roland Rack, MD [facility]  [phone]  If 7pm- 7am, please page neurology on call as listed in [place].

## 2021-04-05 NOTE — ED Provider Notes (Signed)
[facility] Provider Note   CSN: [id number] Arrival date & time: 04/05/21  1432     History Chief Complaint  Patient presents with   Stroke Symptoms    Joshua Case is a 60 y.o. male.  Patient had acute onset at about 1100 on Tuesday morning with right upper extremity right lower extremity weakness and right facial droop.  Patient states the upper extremity and lower extremity weakness is improving some.  Facial droop still present.  Past medical history sniffing for hypertension and diabetes.      Past Medical History:  Diagnosis Date   Diabetes mellitus without complication ([place])    Hypertension     Patient Active Problem List   Diagnosis Date Noted   HAV (hallux abducto valgus) 10/20/2014   Bunion 10/20/2014   Plantar porokeratosis, acquired 10/20/2014   Lumbar disc disease with radiculopathy 07/13/2013   DIAB W/RENAL MANIFESTS TYPE II/UNS TYPE UNCNTRL 10/15/2010   DYSLIPIDEMIA 10/15/2010   HYPERTENSION, ESSENTIAL, UNCONTROLLED 10/15/2010   MICROALBUMINURIA 10/15/2010   PERS HX NONCOMPLIANCE W/MED TX PRS HAZARDS HLTH 10/15/2010    History reviewed. No pertinent surgical history.     Family History  Problem Relation Age of Onset   Heart disease Mother    Diabetes Father    Hypertension Father    Diabetes Daughter    Hypertension Daughter     Social History   Tobacco Use   Smoking status: Never   Smokeless tobacco: Never  Substance Use Topics   Alcohol use: Yes    Alcohol/week: 0.0 standard drinks   Drug use: Yes    Types: Marijuana    Home Medications Prior to Admission medications   Medication Sig Start Date End Date Taking? Authorizing Provider  amLODipine (NORVASC) 10 MG tablet Take 10 mg by mouth daily. 06/09/12  Yes [provider]  diclofenac (VOLTAREN) 75 MG EC tablet Take 1 tablet (75 mg total) by mouth 3 (three) times daily. Patient taking differently: Take 75 mg by mouth 2 (two) times  daily. 02/15/21  Yes Jessy Oto, MD  gabapentin (NEURONTIN) 300 MG capsule Take 1 capsule (300 mg total) by mouth at bedtime. 02/15/21  Yes Jessy Oto, MD  glimepiride (AMARYL) 4 MG tablet Take 4 mg by mouth daily. 12/12/20  Yes [provider]  insulin detemir (LEVEMIR) 100 UNIT/ML injection Inject 20 Units into the skin at bedtime.   Yes [provider]  Insulin Pen Needle (B-D UF III MINI PEN NEEDLES) 31G X 5 MM MISC 1 each by Does not apply route daily. 03/31/14  Yes Roselee Culver, MD  metFORMIN (GLUCOPHAGE) 1000 MG tablet Take 1 tablet (1,000 mg total) by mouth 2 (two) times daily with a meal. PATIENT NEEDS OFFICE VISIT FOR ADDITIONAL REFILLS 08/06/15  Yes Roselee Culver, MD  metoprolol succinate (TOPROL-XL) 100 MG 24 hr tablet Take 100 mg by mouth 2 (two) times daily. 12/12/20  Yes [provider]  oxyCODONE-acetaminophen (PERCOCET) 5-325 MG tablet Take 2 tablets by mouth every 6 (six) hours as needed for severe pain. 08/19/18  Yes Jessy Oto, MD  rosuvastatin (CRESTOR) 20 MG tablet Take 20 mg by mouth daily.   Yes [provider]  valsartan-hydrochlorothiazide (DIOVAN-HCT) 160-25 MG tablet Take 1 tablet by mouth daily. 05/10/16  Yes [provider]  amLODipine (NORVASC) 5 MG tablet Take 1 tablet (5 mg total) by mouth daily. PATIENT NEEDS OFFICE VISIT FOR ADDITIONAL REFILLS Patient not taking: Reported on  04/05/2021 08/06/15   Roselee Culver, MD    Allergies    Victoza [liraglutide]  Review of Systems   Review of Systems  Constitutional:  Negative for chills and fever.  HENT:  Negative for ear pain and sore throat.   Eyes:  Negative for pain and visual disturbance.  Respiratory:  Negative for cough and shortness of breath.   Cardiovascular:  Negative for chest pain and palpitations.  Gastrointestinal:  Negative for abdominal pain and vomiting.  Genitourinary:  Negative for dysuria and hematuria.  Musculoskeletal:  Negative  for arthralgias and back pain.  Skin:  Negative for color change and rash.  Neurological:  Positive for facial asymmetry, speech difficulty and weakness. Negative for seizures and syncope.  All other systems reviewed and are negative.  Physical Exam Updated Vital Signs BP (!) 183/91   Pulse (!) 55   Resp 18   SpO2 100%   Physical Exam Vitals and nursing note reviewed.  Constitutional:      General: He is not in acute distress.    Appearance: Normal appearance. He is well-developed.  HENT:     Head: Normocephalic and atraumatic.  Eyes:     Extraocular Movements: Extraocular movements intact.     Conjunctiva/sclera: Conjunctivae normal.     Pupils: Pupils are equal, round, and reactive to light.  Cardiovascular:     Rate and Rhythm: Normal rate and regular rhythm.     Heart sounds: No murmur heard. Pulmonary:     Effort: Pulmonary effort is normal. No respiratory distress.     Breath sounds: Normal breath sounds.  Abdominal:     Palpations: Abdomen is soft.     Tenderness: There is no abdominal tenderness.  Musculoskeletal:     Cervical back: Neck supple.  Skin:    General: Skin is warm and dry.  Neurological:     Mental Status: He is alert.     Cranial Nerves: Cranial nerve deficit present.     Motor: Weakness present.     Comments: Patient with right facial droop.  Partial.  Patient with weakness to right upper extremity and right lower extremity 4 out of 5.    ED Results / Procedures / Treatments   Labs (all labs ordered are listed, but only abnormal results are displayed) Labs Reviewed  COMPREHENSIVE METABOLIC PANEL - Abnormal; Notable for the following components:      Result Value   CO2 21 (*)    Glucose, Bld 61 (*)    BUN 34 (*)    Creatinine, Ser 3.14 (*)    Calcium 8.6 (*)    Albumin 3.0 (*)    GFR, Estimated 22 (*)    All other components within normal limits  RAPID URINE DRUG SCREEN, HOSP PERFORMED - Abnormal; Notable for the following components:    Tetrahydrocannabinol POSITIVE (*)    All other components within normal limits  URINALYSIS, ROUTINE W REFLEX MICROSCOPIC - Abnormal; Notable for the following components:   APPearance CLOUDY (*)    Hgb urine dipstick SMALL (*)    Protein, ur >=300 (*)    Leukocytes,Ua TRACE (*)    Bacteria, UA RARE (*)    All other components within normal limits  CBC WITH DIFFERENTIAL/PLATELET - Abnormal; Notable for the following components:   Hemoglobin 11.9 (*)    HCT 38.1 (*)    All other components within normal limits  RESP PANEL BY RT-PCR (FLU A&B, COVID) ARPGX2  ETHANOL  PROTIME-INR  APTT  EKG EKG Interpretation  Date/Time:  Friday April 05 2021 14:34:03 EDT Ventricular Rate:  64 PR Interval:  230 QRS Duration: 106 QT Interval:  418 QTC Calculation: 431 R Axis:   31 Text Interpretation: Sinus rhythm with 1st degree A-V block Left ventricular hypertrophy with repolarization abnormality ( Sokolow-Lyon , Cornell product ) Abnormal ECG Confirmed by Fredia Sorrow [phone]) on 04/05/2021 8:53:33 PM  Radiology CT HEAD WO CONTRAST (5MM)  Result Date: 04/05/2021 CLINICAL DATA:  Neuro deficit, acute, stroke suspected. Additional history provided: Right-sided weakness, right facial droop since Tuesday. History of diabetes and hypertension. EXAM: CT HEAD WITHOUT CONTRAST TECHNIQUE: Contiguous axial images were obtained from the base of the skull through the vertex without intravenous contrast. COMPARISON:  No pertinent prior exams available for comparison. FINDINGS: Brain: Cerebral volume is normal. Chronic small-vessel infarcts within the right basal ganglia and right thalamocapsular junction. There are two small vessel infarcts within the left basal ganglia/internal capsule, one of which appears chronic. The other has an age-indeterminate appearance, and may be acute/early subacute given the provided history. Background mild patchy and ill-defined hypoattenuation within the cerebral white matter,  nonspecific but compatible with chronic small vessel ischemic disease. There is no acute intracranial hemorrhage. No demarcated cortical infarct. No extra-axial fluid collection. No evidence of an intracranial mass. No midline shift. Vascular: No hyperdense vessel.  Atherosclerotic calcifications. Skull: Normal. Negative for fracture or focal lesion. Sinuses/Orbits: Visualized orbits show no acute finding. Trace bilateral ethmoid sinus mucosal thickening. Small volume fluid within a posterior left ethmoid air cell. IMPRESSION: Age-indeterminate small vessel infarct within the left basal ganglia/internal capsule. This may be acute/early subacute given the provided history. Consider a brain MRI for further evaluation. Chronic appearing small vessel infarcts within the bilateral basal ganglia and right thalamocapsular junction. Background mild chronic small vessel ischemic changes within the cerebral white matter. Mild paranasal sinus disease at the imaged levels, as described. Electronically Signed   By: Kellie Simmering DO   On: 04/05/2021 15:51   MR BRAIN WO CONTRAST  Result Date: 04/05/2021 CLINICAL DATA:  Right-sided weakness EXAM: MRI HEAD WITHOUT CONTRAST TECHNIQUE: Multiplanar, multiecho pulse sequences of the brain and surrounding structures were obtained without intravenous contrast. COMPARISON:  None. FINDINGS: Brain: Small focus of abnormal diffusion restriction at the left cerebral peduncle. No other diffusion abnormality. Chronic blood products noted at both basal ganglia. Old small vessel infarcts of the right caudate head and left lentiform nucleus. There is multifocal hyperintense T2-weighted signal within the white matter. Generalized volume loss without a clear lobar predilection. Pituitary gland appears enlarged with craniocaudal diameter of 9 mm. Vascular: Major flow voids are preserved. Skull and upper cervical spine: Normal calvarium and skull base. Visualized upper cervical spine and soft tissues  are normal. Sinuses/Orbits:No paranasal sinus fluid levels or advanced mucosal thickening. No mastoid or middle ear effusion. Normal orbits. IMPRESSION: 1. Small focus of acute ischemia at the left cerebral peduncle. No hemorrhage or mass effect. 2. Old small vessel infarcts of the right caudate head and left lentiform nucleus. 3. Pituitary gland appears enlarged with craniocaudal diameter of 9 mm. This may be a pituitary adenoma. Dedicated MRI of the pituitary gland with and without contrast is recommended for further characterization and can be performed on an outpatient basis. Electronically Signed   By: Ulyses Jarred M.D.   On: 04/05/2021 20:44    Procedures Procedures   Medications Ordered in ED Medications - No data to display  ED Course  I have reviewed the  triage vital signs and the nursing notes.  Pertinent labs & imaging results that were available during my care of the patient were reviewed by me and considered in my medical decision making (see chart for details).    MDM Rules/Calculators/A&P                         CRITICAL CARE Performed by: Fredia Sorrow Total critical care time: 35 minutes Critical care time was exclusive of separately billable procedures and treating other patients. Critical care was necessary to treat or prevent imminent or life-threatening deterioration. Critical care was time spent personally by me on the following activities: development of treatment plan with patient and/or surrogate as well as nursing, discussions with consultants, evaluation of patient's response to treatment, examination of patient, obtaining history from patient or surrogate, ordering and performing treatments and interventions, ordering and review of laboratory studies, ordering and review of radiographic studies, pulse oximetry and re-evaluation of patient's condition.  Patient with work-up for concern for acute stroke.  MRI confirmed it.  Also evidence of old infarcts.  Patient  has significant risk factors with hypertension and and diabetes.  Patient's blood pressures currently elevated has not has blood pressure medicine and sees been here.  But neurologist not concerned that blood pressure needs to be aggressively controlled at this point in time.  MRI shows evidence of an acute ischemic infarct left cerebral peduncle.  Also evidence of old small infarct right caudate and left lentiform nuclei.  Also shows some enlargement of his pituitary gland.  Discussed with neuro hospitalist Dr. Leonel Ramsay.  Patient needs medical admission.  We will make arrangements for medical admission.  Final Clinical Impression(s) / ED Diagnoses Final diagnoses:  Cerebrovascular accident (CVA), unspecified mechanism [facility])    Rx / DC Orders ED Discharge Orders     None        Fredia Sorrow, MD 04/05/21 2125

## 2021-04-05 NOTE — ED Notes (Signed)
Pt transported to MRI 

## 2021-04-05 NOTE — H&P (Signed)
History and Physical    Joshua Case:[id number] DOB: [birth year] DOA: 04/05/2021  PCP: [place], [place] Patient coming from: Home  Chief Complaint: Right facial droop, right-sided weakness  HPI: Joshua Case is a 60 y.o. male with medical history significant of hypertension, hyperlipidemia, insulin-dependent type 2 diabetes, lumbar degenerative disc disease, sciatica, lumbar spinal stenosis with neurogenic claudication, bilateral primary osteoarthritis of hip, bilateral sacroiliac joint dysfunction presents to the ED with complaints of right-sided weakness and right facial droop which started at around 11 AM on 04/02/2021.  In the ED, blood pressure significantly elevated.  Labs showing WBC 7.1, hemoglobin 11.9, MCV 87.8, platelet count 289.  Sodium 140, potassium 4.5, chloride 111, bicarb 21, BUN 34, creatinine 3.1, glucose 61.  INR 1.0.  Blood ethanol level undetectable.  UDS positive for THC.  UA with negative nitrite, trace leukocytes, 21-50 WBCs, and rare bacteria.  Screening COVID and influenza PCR pending.  Head CT showing age indeterminate small vessel infarct within the left basal ganglia/internal capsule.  Also showing chronic appearing small infarcts within the bilateral basal ganglia and right thalamocapsular junction.  Brain MRI showing small focus of acute ischemia at the left cerebral peduncle; no hemorrhage or mass-effect.  Showing old small infarcts of the right caudate head and left lentiform nucleus.  In addition, showing enlarged pituitary gland concerning for pituitary adenoma.  Neurology consulted.  Patient states Tuesday, 04/02/2021 around 11 AM he noticed that his face was drooping on the right.  His right leg felt weak and he had difficulty walking.  Also his right arm felt weak.  Symptoms have now improved.  Denies history of prior stroke.  Does report history of uncontrolled hypertension for which he stopped taking his medications but then started taking  them again recently.  Patient is also endorsing dyspnea on exertion which has been going on for a long time.  Denies chest pain or history of CAD.  Denies cough or fevers.  Review of Systems:  All systems reviewed and apart from history of presenting illness, are negative.  Past Medical History:  Diagnosis Date   Diabetes mellitus without complication ([place])    Hypertension     History reviewed. No pertinent surgical history.   reports that he has never smoked. He has never used smokeless tobacco. He reports current alcohol use. He reports current drug use. Drug: Marijuana.  Allergies  Allergen Reactions   Victoza [Liraglutide] Swelling    Family History  Problem Relation Age of Onset   Heart disease Mother    Diabetes Father    Hypertension Father    Diabetes Daughter    Hypertension Daughter     Prior to Admission medications   Medication Sig Start Date End Date Taking? Authorizing Provider  amLODipine (NORVASC) 10 MG tablet Take 10 mg by mouth daily. 06/09/12  Yes [provider]  diclofenac (VOLTAREN) 75 MG EC tablet Take 1 tablet (75 mg total) by mouth 3 (three) times daily. Patient taking differently: Take 75 mg by mouth 2 (two) times daily. 02/15/21  Yes Jessy Oto, MD  gabapentin (NEURONTIN) 300 MG capsule Take 1 capsule (300 mg total) by mouth at bedtime. 02/15/21  Yes Jessy Oto, MD  glimepiride (AMARYL) 4 MG tablet Take 4 mg by mouth daily. 12/12/20  Yes [provider]  insulin detemir (LEVEMIR) 100 UNIT/ML injection Inject 20 Units into the skin at bedtime.   Yes [provider]  Insulin Pen Needle (B-D UF III MINI  PEN NEEDLES) 31G X 5 MM MISC 1 each by Does not apply route daily. 03/31/14  Yes Roselee Culver, MD  metFORMIN (GLUCOPHAGE) 1000 MG tablet Take 1 tablet (1,000 mg total) by mouth 2 (two) times daily with a meal. PATIENT NEEDS OFFICE VISIT FOR ADDITIONAL REFILLS 08/06/15  Yes Roselee Culver, MD  metoprolol succinate  (TOPROL-XL) 100 MG 24 hr tablet Take 100 mg by mouth 2 (two) times daily. 12/12/20  Yes [provider]  oxyCODONE-acetaminophen (PERCOCET) 5-325 MG tablet Take 2 tablets by mouth every 6 (six) hours as needed for severe pain. 08/19/18  Yes Jessy Oto, MD  rosuvastatin (CRESTOR) 20 MG tablet Take 20 mg by mouth daily.   Yes [provider]  valsartan-hydrochlorothiazide (DIOVAN-HCT) 160-25 MG tablet Take 1 tablet by mouth daily. 05/10/16  Yes [provider]  amLODipine (NORVASC) 5 MG tablet Take 1 tablet (5 mg total) by mouth daily. PATIENT NEEDS OFFICE VISIT FOR ADDITIONAL REFILLS Patient not taking: Reported on 04/05/2021 08/06/15   Roselee Culver, MD    Physical Exam: Vitals:   04/05/21 1826 04/05/21 1930 04/05/21 1945 04/05/21 2130  BP: (!) 143/65 (!) 176/100 (!) 183/91 (!) 170/103  Pulse: (!) 55 (!) 56 (!) 55 (!) 57  Resp: 18 19 18 12   SpO2: 100% 100% 100% 100%    Physical Exam Constitutional:      General: He is not in acute distress. HENT:     Head: Normocephalic and atraumatic.  Eyes:     Extraocular Movements: Extraocular movements intact.     Conjunctiva/sclera: Conjunctivae normal.  Cardiovascular:     Rate and Rhythm: Normal rate and regular rhythm.     Pulses: Normal pulses.  Pulmonary:     Effort: Pulmonary effort is normal. No respiratory distress.     Breath sounds: Normal breath sounds. No wheezing or rales.  Abdominal:     General: Bowel sounds are normal. There is no distension.     Palpations: Abdomen is soft.     Tenderness: There is no abdominal tenderness.  Musculoskeletal:        General: No swelling or tenderness.     Cervical back: Normal range of motion and neck supple.  Skin:    General: Skin is warm and dry.  Neurological:     Mental Status: He is alert and oriented to person, place, and time.     Comments: Right-sided facial droop Speech fluent Mild right pronator drift Strength 4 out of 5 in right upper and  lower extremities. Strength 5 out of 5 in left upper and lower extremities.     Labs on Admission: I have personally reviewed following labs and imaging studies  CBC: Recent Labs  Lab 04/05/21 1451  WBC 7.1  NEUTROABS 3.9  HGB 11.9*  HCT 38.1*  MCV 87.8  PLT 329   Basic Metabolic Panel: Recent Labs  Lab 04/05/21 1451  NA 140  K 4.5  CL 111  CO2 21*  GLUCOSE 61*  BUN 34*  CREATININE 3.14*  CALCIUM 8.6*   GFR: CrCl cannot be calculated (Unknown ideal weight.). Liver Function Tests: Recent Labs  Lab 04/05/21 1451  AST 16  ALT 15  ALKPHOS 66  BILITOT 0.7  PROT 6.6  ALBUMIN 3.0*   No results for input(s): LIPASE, AMYLASE in the last 168 hours. No results for input(s): AMMONIA in the last 168 hours. Coagulation Profile: Recent Labs  Lab 04/05/21 1451  INR 1.0   Cardiac Enzymes: No  results for input(s): CKTOTAL, CKMB, CKMBINDEX, TROPONINI in the last 168 hours. BNP (last 3 results) No results for input(s): PROBNP in the last 8760 hours. HbA1C: No results for input(s): HGBA1C in the last 72 hours. CBG: Recent Labs  Lab 04/05/21 2315  GLUCAP 49*   Lipid Profile: No results for input(s): CHOL, HDL, LDLCALC, TRIG, CHOLHDL, LDLDIRECT in the last 72 hours. Thyroid Function Tests: No results for input(s): TSH, T4TOTAL, FREET4, T3FREE, THYROIDAB in the last 72 hours. Anemia Panel: No results for input(s): VITAMINB12, FOLATE, FERRITIN, TIBC, IRON, RETICCTPCT in the last 72 hours. Urine analysis:    Component Value Date/Time   COLORURINE YELLOW 04/05/2021 1925   APPEARANCEUR CLOUDY (A) 04/05/2021 1925   LABSPEC 1.018 04/05/2021 1925   PHURINE 5.0 04/05/2021 1925   GLUCOSEU NEGATIVE 04/05/2021 1925   HGBUR SMALL (A) 04/05/2021 1925   HGBUR Trace 10/15/2010 1343   BILIRUBINUR NEGATIVE 04/05/2021 1925   BILIRUBINUR neg 03/31/2014 1134   KETONESUR NEGATIVE 04/05/2021 1925   PROTEINUR >=300 (A) 04/05/2021 1925   UROBILINOGEN 0.2 03/31/2014 1134    UROBILINOGEN 0.2 10/24/2013 2151   NITRITE NEGATIVE 04/05/2021 1925   LEUKOCYTESUR TRACE (A) 04/05/2021 1925    Radiological Exams on Admission: CT HEAD WO CONTRAST (5MM)  Result Date: 04/05/2021 CLINICAL DATA:  Neuro deficit, acute, stroke suspected. Additional history provided: Right-sided weakness, right facial droop since Tuesday. History of diabetes and hypertension. EXAM: CT HEAD WITHOUT CONTRAST TECHNIQUE: Contiguous axial images were obtained from the base of the skull through the vertex without intravenous contrast. COMPARISON:  No pertinent prior exams available for comparison. FINDINGS: Brain: Cerebral volume is normal. Chronic small-vessel infarcts within the right basal ganglia and right thalamocapsular junction. There are two small vessel infarcts within the left basal ganglia/internal capsule, one of which appears chronic. The other has an age-indeterminate appearance, and may be acute/early subacute given the provided history. Background mild patchy and ill-defined hypoattenuation within the cerebral white matter, nonspecific but compatible with chronic small vessel ischemic disease. There is no acute intracranial hemorrhage. No demarcated cortical infarct. No extra-axial fluid collection. No evidence of an intracranial mass. No midline shift. Vascular: No hyperdense vessel.  Atherosclerotic calcifications. Skull: Normal. Negative for fracture or focal lesion. Sinuses/Orbits: Visualized orbits show no acute finding. Trace bilateral ethmoid sinus mucosal thickening. Small volume fluid within a posterior left ethmoid air cell. IMPRESSION: Age-indeterminate small vessel infarct within the left basal ganglia/internal capsule. This may be acute/early subacute given the provided history. Consider a brain MRI for further evaluation. Chronic appearing small vessel infarcts within the bilateral basal ganglia and right thalamocapsular junction. Background mild chronic small vessel ischemic changes  within the cerebral white matter. Mild paranasal sinus disease at the imaged levels, as described. Electronically Signed   By: Kellie Simmering DO   On: 04/05/2021 15:51   MR BRAIN WO CONTRAST  Result Date: 04/05/2021 CLINICAL DATA:  Right-sided weakness EXAM: MRI HEAD WITHOUT CONTRAST TECHNIQUE: Multiplanar, multiecho pulse sequences of the brain and surrounding structures were obtained without intravenous contrast. COMPARISON:  None. FINDINGS: Brain: Small focus of abnormal diffusion restriction at the left cerebral peduncle. No other diffusion abnormality. Chronic blood products noted at both basal ganglia. Old small vessel infarcts of the right caudate head and left lentiform nucleus. There is multifocal hyperintense T2-weighted signal within the white matter. Generalized volume loss without a clear lobar predilection. Pituitary gland appears enlarged with craniocaudal diameter of 9 mm. Vascular: Major flow voids are preserved. Skull and upper cervical spine: Normal  calvarium and skull base. Visualized upper cervical spine and soft tissues are normal. Sinuses/Orbits:No paranasal sinus fluid levels or advanced mucosal thickening. No mastoid or middle ear effusion. Normal orbits. IMPRESSION: 1. Small focus of acute ischemia at the left cerebral peduncle. No hemorrhage or mass effect. 2. Old small vessel infarcts of the right caudate head and left lentiform nucleus. 3. Pituitary gland appears enlarged with craniocaudal diameter of 9 mm. This may be a pituitary adenoma. Dedicated MRI of the pituitary gland with and without contrast is recommended for further characterization and can be performed on an outpatient basis. Electronically Signed   By: Ulyses Jarred M.D.   On: 04/05/2021 20:44    EKG: Independently reviewed.  Sinus rhythm with first-degree AV block.  Slight T wave abnormality in lateral leads.  No prior tracing for comparison.  Assessment/Plan Principal Problem:   Acute CVA (cerebrovascular accident)  [facility]) Active Problems:   Hypertensive emergency   AKI (acute kidney injury) ([place])   Dyspnea on exertion   Diabetes ([place])   Acute CVA Presenting right-sided facial droop and right upper and lower extremity weakness which started at around 11 AM on 04/02/2021.  Symptoms have improved.  Out of tPA window. Head CT showing age indeterminate small vessel infarct within the left basal ganglia/internal capsule.  Also showing chronic appearing small infarcts within the bilateral basal ganglia and right thalamocapsular junction.  Brain MRI showing small focus of acute ischemia at the left cerebral peduncle; no hemorrhage or mass-effect.  Showing old small infarcts of the right caudate head and left lentiform nucleus.  -Neurology consulted -Telemetry monitoring -2D echocardiogram -Hemoglobin A1c -Start aspirin 325 mg daily -Continue high intensity statin (Crestor 20 mg daily).  Check lipid panel. -Frequent neurochecks -PT, OT, speech therapy. -Passed bedside swallow eval  Hypertensive emergency Blood pressure elevated with systolic around 671.  Patient reports medication noncompliance.  Stroke and kidney injury likely due to uncontrolled hypertension. -Discussed with neurology, since patient is out of permissive hypertension window, okay to treat with goal to keep systolic blood pressure less than 180 at this time and then gradually reduce further.  Continue home amlodipine and metoprolol.  IV hydralazine PRN ordered.  AKI BUN 34, creatinine 3.1.  Creatinine was 1.5 on labs done in April 2018, no recent labs for comparison. AKI likely related to uncontrolled hypertension and home NSAID use for chronic pain -Gentle IV fluid hydration.  Monitor renal function and urine output.  Avoid nephrotoxic agents/contrast.  Hold home valsartan-hydrochlorothiazide and Voltaren.  Chronic dyspnea on exertion No hypoxia or signs of respiratory distress.  Satting 100% on room air.  Lungs clear on exam.  Not endorsing  fevers or cough.  No documented history of CAD but EKG does show slight T wave abnormality in lateral leads, no prior tracing for comparison.  However, ACS less likely as patient is not endorsing chest pain and dyspnea on exertion is chronic. -Cardiac monitoring. Check high-sensitivity troponin.  Order echocardiogram.  Insulin-dependent type 2 diabetes Hypoglycemia Patient states he has not had anything to eat because he has been in the emergency room for several hours.  His last dose of Levemir was yesterday evening.  Does take a sulfonylurea.  Blood glucose 61 on initial labs and 49 on repeat.  He is not symptomatic.  Last A1c 8.4 in April 2018. -Repeat A1c.  Passed RN bedside swallow eval and he is being given food to eat (regular diet for now).  Recheck blood glucose after he eats.  Hold home  Levemir, glimepiride, and metformin.  Order very sensitive sliding scale insulin sensitive every 4 hours.  Hyperlipidemia -Continue Crestor.  Lipid panel ordered.  Normocytic anemia -Anemia panel  Substance abuse -UDS positive for THC.  Continue to counsel.  Abnormal urinalysis UA with negative nitrite, trace leukocytes, 21-50 WBCs, and rare bacteria.  No leukocytosis.  Not endorsing any UTI symptoms. -Add on urine culture  Enlarged pituitary gland MRI showing enlarged pituitary gland concerning for pituitary adenoma.  -Patient will need dedicated pituitary protocol MRI with and without contrast for further evaluation which can be done on an outpatient basis.  Chronic pain -Continue gabapentin, Tylenol as needed.  Hold Voltaren given AKI.  DVT prophylaxis: Subcutaneous heparin Code Status: Full code Family Communication: Daughter at bedside. Disposition Plan: Status is: Inpatient  Remains inpatient appropriate because:Ongoing diagnostic testing needed not appropriate for outpatient work up and Inpatient level of care appropriate due to severity of illness  Dispo: The patient is from:  Home              Anticipated d/c is to: Home              Patient currently is not medically stable to d/c.   Difficult to place patient No  Level of care: Level of care: Telemetry Medical  The medical decision making on this patient was of high complexity and the patient is at high risk for clinical deterioration, therefore this is a level 3 visit.  Shela Leff MD [facility]  If 7PM-7AM, please contact night-coverage [email or web address]  04/05/2021, 11:48 PM

## 2021-04-05 NOTE — ED Provider Notes (Signed)
Emergency Medicine Provider Triage Evaluation Note  Joshua Case , a 60 y.o. male  was evaluated in triage.  Pt complains of right-sided weakness.  Patient states that symptoms started on Tuesday constant since then.  Patient denies any falls or injuries.  Denies any headaches, visual disturbance, numbness.  Review of Systems  Positive: Right-sided weakness, facial droop Negative: Headache, visual disturbance, numbness  Physical Exam  There were no vitals taken for this visit. Gen:   Awake, no distress   Resp:  Normal effort  MSK:   Moves left-sided extremities without difficulty  Other:  Right-sided facial droop, weakness to right upper and lower extremity.  Pronator drift positive.  Grip strength unequal.  Sensation to light touch intact to bilateral upper and lower extremities.  EOM intact bilaterally.  Pupils PERRL.  Medical Decision Making  Medically screening exam initiated at 2:52 PM.  Appropriate orders placed.  Joshua Case was informed that the remainder of the evaluation will be completed by another provider, this initial triage assessment does not replace that evaluation, and the importance of remaining in the ED until their evaluation is complete.  The patient appears stable so that the remainder of the work up may be completed by another provider.      Loni Beckwith, PA-C 04/05/21 1453    Malvin Johns, MD 04/05/21 [phone]

## 2021-04-05 NOTE — ED Notes (Signed)
Pt provided happy meal with cheese, graham crackers and diet sprite

## 2021-04-06 ENCOUNTER — Other Ambulatory Visit (HOSPITAL_COMMUNITY): Payer: Federal, State, Local not specified - PPO

## 2021-04-06 ENCOUNTER — Inpatient Hospital Stay (HOSPITAL_COMMUNITY): Payer: Federal, State, Local not specified - PPO

## 2021-04-06 DIAGNOSIS — I161 Hypertensive emergency: Secondary | ICD-10-CM

## 2021-04-06 DIAGNOSIS — N179 Acute kidney failure, unspecified: Secondary | ICD-10-CM

## 2021-04-06 DIAGNOSIS — R0600 Dyspnea, unspecified: Secondary | ICD-10-CM

## 2021-04-06 LAB — LIPID PANEL
Cholesterol: 212 mg/dL — ABNORMAL HIGH (ref 0–200)
HDL: 36 mg/dL — ABNORMAL LOW (ref >40)
LDL Cholesterol: 158 mg/dL — ABNORMAL HIGH (ref 0–99)
Total CHOL/HDL Ratio: 5.9 RATIO
Triglycerides: 88 mg/dL (ref <150)
VLDL: 18 mg/dL (ref 0–40)

## 2021-04-06 LAB — VITAMIN B12: Vitamin B-12: 641 pg/mL (ref 180–914)

## 2021-04-06 LAB — HEMOGLOBIN A1C
Hgb A1c MFr Bld: 6 % — ABNORMAL HIGH (ref 4.8–5.6)
Mean Plasma Glucose: 125.5 mg/dL

## 2021-04-06 LAB — CBG MONITORING, ED
Glucose-Capillary: 150 mg/dL — ABNORMAL HIGH (ref 70–99)
Glucose-Capillary: 158 mg/dL — ABNORMAL HIGH (ref 70–99)
Glucose-Capillary: 69 mg/dL — ABNORMAL LOW (ref 70–99)
Glucose-Capillary: 91 mg/dL (ref 70–99)

## 2021-04-06 LAB — FOLATE: Folate: 21.8 ng/mL (ref >5.9)

## 2021-04-06 LAB — TROPONIN I (HIGH SENSITIVITY): Troponin I (High Sensitivity): 17 ng/L (ref <18)

## 2021-04-06 LAB — GLUCOSE, CAPILLARY
Glucose-Capillary: 123 mg/dL — ABNORMAL HIGH (ref 70–99)
Glucose-Capillary: 140 mg/dL — ABNORMAL HIGH (ref 70–99)
Glucose-Capillary: 84 mg/dL (ref 70–99)

## 2021-04-06 LAB — RETICULOCYTES
Immature Retic Fract: 8.7 % (ref 2.3–15.9)
RBC.: 4.24 MIL/uL (ref 4.22–5.81)
Retic Count, Absolute: 56.8 10*3/uL (ref 19.0–186.0)
Retic Ct Pct: 1.3 % (ref 0.4–3.1)

## 2021-04-06 LAB — FERRITIN: Ferritin: 119 ng/mL (ref 24–336)

## 2021-04-06 LAB — IRON AND TIBC
Iron: 160 ug/dL (ref 45–182)
Saturation Ratios: 66 % — ABNORMAL HIGH (ref 17.9–39.5)
TIBC: 244 ug/dL — ABNORMAL LOW (ref 250–450)
UIBC: 84 ug/dL

## 2021-04-06 LAB — HIV ANTIBODY (ROUTINE TESTING W REFLEX): HIV Screen 4th Generation wRfx: NONREACTIVE

## 2021-04-06 MED ORDER — CLOPIDOGREL BISULFATE 300 MG PO TABS
300.0000 mg | ORAL_TABLET | Freq: Once | ORAL | Status: AC
  Administered 2021-04-06: 300 mg via ORAL
  Filled 2021-04-06: qty 1

## 2021-04-06 MED ORDER — ROSUVASTATIN CALCIUM 20 MG PO TABS
20.0000 mg | ORAL_TABLET | Freq: Every day | ORAL | Status: DC
  Administered 2021-04-06 – 2021-04-08 (×3): 20 mg via ORAL
  Filled 2021-04-06 (×3): qty 1

## 2021-04-06 MED ORDER — ASPIRIN 325 MG PO TABS
325.0000 mg | ORAL_TABLET | Freq: Every day | ORAL | Status: DC
  Administered 2021-04-06 – 2021-04-08 (×3): 325 mg via ORAL
  Filled 2021-04-06 (×3): qty 1

## 2021-04-06 MED ORDER — ACETAMINOPHEN 160 MG/5ML PO SOLN: 650.0000 mg | ORAL | Status: DC | PRN

## 2021-04-06 MED ORDER — STROKE: EARLY STAGES OF RECOVERY BOOK
Freq: Once | Status: AC
  Filled 2021-04-06: qty 1

## 2021-04-06 MED ORDER — ACETAMINOPHEN 650 MG RE SUPP: 650.0000 mg | RECTAL | Status: DC | PRN

## 2021-04-06 MED ORDER — AMLODIPINE BESYLATE 5 MG PO TABS: 10.0000 mg | ORAL_TABLET | Freq: Every day | ORAL | Status: DC

## 2021-04-06 MED ORDER — METOPROLOL SUCCINATE ER 100 MG PO TB24
100.0000 mg | ORAL_TABLET | Freq: Two times a day (BID) | ORAL | Status: DC
  Administered 2021-04-06 – 2021-04-08 (×5): 100 mg via ORAL
  Filled 2021-04-06 (×5): qty 1

## 2021-04-06 MED ORDER — METOPROLOL SUCCINATE ER 100 MG PO TB24: 100.0000 mg | ORAL_TABLET | Freq: Two times a day (BID) | ORAL | Status: DC

## 2021-04-06 MED ORDER — HYDRALAZINE HCL 20 MG/ML IJ SOLN
5.0000 mg | INTRAMUSCULAR | Status: DC | PRN
Start: 2021-04-06 — End: 2021-04-08
  Administered 2021-04-07 – 2021-04-08 (×3): 5 mg via INTRAVENOUS
  Filled 2021-04-06 (×3): qty 1

## 2021-04-06 MED ORDER — ASPIRIN 300 MG RE SUPP
300.0000 mg | Freq: Every day | RECTAL | Status: DC
  Filled 2021-04-06 (×2): qty 1

## 2021-04-06 MED ORDER — HYDRALAZINE HCL 20 MG/ML IJ SOLN
5.0000 mg | INTRAMUSCULAR | Status: DC | PRN
Start: 2021-04-06 — End: 2021-04-06

## 2021-04-06 MED ORDER — GABAPENTIN 300 MG PO CAPS
300.0000 mg | ORAL_CAPSULE | Freq: Every day | ORAL | Status: DC
Start: 2021-04-06 — End: 2021-04-08
  Administered 2021-04-06 – 2021-04-07 (×2): 300 mg via ORAL
  Filled 2021-04-06 (×2): qty 1

## 2021-04-06 MED ORDER — INSULIN ASPART 100 UNIT/ML IJ SOLN
0.0000 [IU] | INTRAMUSCULAR | Status: DC
Start: 2021-04-06 — End: 2021-04-08
  Administered 2021-04-06: 1 [IU] via SUBCUTANEOUS

## 2021-04-06 MED ORDER — AMLODIPINE BESYLATE 10 MG PO TABS
10.0000 mg | ORAL_TABLET | Freq: Every day | ORAL | Status: DC
  Administered 2021-04-06 – 2021-04-08 (×3): 10 mg via ORAL
  Filled 2021-04-06: qty 2
  Filled 2021-04-06 (×2): qty 1

## 2021-04-06 MED ORDER — ACETAMINOPHEN 325 MG PO TABS: 650.0000 mg | ORAL_TABLET | ORAL | Status: DC | PRN

## 2021-04-06 MED ORDER — HEPARIN SODIUM (PORCINE) 5000 UNIT/ML IJ SOLN
5000.0000 [IU] | Freq: Three times a day (TID) | INTRAMUSCULAR | Status: DC
  Administered 2021-04-06 – 2021-04-08 (×7): 5000 [IU] via SUBCUTANEOUS
  Filled 2021-04-06 (×7): qty 1

## 2021-04-06 MED ORDER — CLOPIDOGREL BISULFATE 75 MG PO TABS
75.0000 mg | ORAL_TABLET | Freq: Every day | ORAL | Status: DC
  Administered 2021-04-07 – 2021-04-08 (×2): 75 mg via ORAL
  Filled 2021-04-06 (×2): qty 1

## 2021-04-06 MED ORDER — SODIUM CHLORIDE 0.9 % IV SOLN: INTRAVENOUS | Status: DC

## 2021-04-06 NOTE — ED Notes (Signed)
Checked patient cbg it was 86 notified RN of blood sugar patient is now sitting up eating breakfast tray with family at bedside and call bell in reach

## 2021-04-06 NOTE — Progress Notes (Addendum)
STROKE TEAM PROGRESS NOTE   ATTENDING NOTE: I reviewed above note and agree with the assessment and plan. Pt was seen and examined.   60 year old male with history of hypertension, diabetes admitted for right-sided weakness.  MRI showed left acute cerebral peduncle infarct, as well as old bilateral CR infarcts.  CTA head and neck moderate to severe right A2, left P2 and right M1 stenosis.  2D echo pending.  UDS positive for THC.  A1c 6.0, LDL 158.  Creatinine 3.14.  On exam, patient awake, alert, eyes open, orientated to age, place, time and people. No aphasia, fluent language, following all simple commands. Able to name and repeat. No gaze palsy, tracking bilaterally, visual field full, PERRL. Mild right facial droop. Tongue midline. Muscle strength equal bilaterally except right hand grip 4+/5. Sensation symmetrical bilaterally, right FTN and HTS mild ataxia, gait not tested.   Etiology for patient stroke likely small vessel disease given risk factors and location.  Recommend aspirin 81 and Plavix 75 DAPT for 3 weeks and then aspirin alone.  Continue Crestor 20 (patient not compliant with Crestor at home).  Stroke risk factor modification.  PT/OT recommend home PT/OT.  For detailed assessment and plan, please refer to above as I have made changes wherever appropriate.   Neurology will sign off. Please call with questions. Pt will follow up with stroke clinic NP at [facility] in about 4 weeks. Thanks for the consult.   Rosalin Hawking, MD PhD Stroke Neurology 04/06/2021 5:45 PM    INTERVAL HISTORY No acute events. Daughter at bedside. He denies new concerns. Continues to report difficulty with walking. We discussed his ongoing work up and care. Questions were answered. Therapy evals pending.   Vitals:   04/06/21 0332 04/06/21 0600 04/06/21 0710 04/06/21 0757  BP: (!) 175/86 134/85 (!) 168/82   Pulse: (!) 55 (!) 55 (!) 58   Resp: 13 13 15    Temp:    98.5 F (36.9 C)  TempSrc:    Oral  SpO2: 100%  97% 100%    CBC:  Recent Labs  Lab 04/05/21 1451  WBC 7.1  NEUTROABS 3.9  HGB 11.9*  HCT 38.1*  MCV 87.8  PLT 195   Basic Metabolic Panel:  Recent Labs  Lab 04/05/21 1451  NA 140  K 4.5  CL 111  CO2 21*  GLUCOSE 61*  BUN 34*  CREATININE 3.14*  CALCIUM 8.6*   Lipid Panel:  Recent Labs  Lab 04/06/21 0341  CHOL 212*  TRIG 88  HDL 36*  CHOLHDL 5.9  VLDL 18  LDLCALC 158*   HgbA1c:  Recent Labs  Lab 04/06/21 0341  HGBA1C 6.0*   Urine Drug Screen:  Recent Labs  Lab 04/05/21 1925  LABOPIA NONE DETECTED  COCAINSCRNUR NONE DETECTED  LABBENZ NONE DETECTED  AMPHETMU NONE DETECTED  THCU POSITIVE*  LABBARB NONE DETECTED    Alcohol Level  Recent Labs  Lab 04/05/21 1451  ETH <10    IMAGING past 24 hours CT HEAD WO CONTRAST (5MM)  Result Date: 04/05/2021 CLINICAL DATA:  Neuro deficit, acute, stroke suspected. Additional history provided: Right-sided weakness, right facial droop since Tuesday. History of diabetes and hypertension. EXAM: CT HEAD WITHOUT CONTRAST TECHNIQUE: Contiguous axial images were obtained from the base of the skull through the vertex without intravenous contrast. COMPARISON:  No pertinent prior exams available for comparison. FINDINGS: Brain: Cerebral volume is normal. Chronic small-vessel infarcts within the right basal ganglia and right thalamocapsular junction. There are two small vessel infarcts  within the left basal ganglia/internal capsule, one of which appears chronic. The other has an age-indeterminate appearance, and may be acute/early subacute given the provided history. Background mild patchy and ill-defined hypoattenuation within the cerebral white matter, nonspecific but compatible with chronic small vessel ischemic disease. There is no acute intracranial hemorrhage. No demarcated cortical infarct. No extra-axial fluid collection. No evidence of an intracranial mass. No midline shift. Vascular: No hyperdense vessel.  Atherosclerotic  calcifications. Skull: Normal. Negative for fracture or focal lesion. Sinuses/Orbits: Visualized orbits show no acute finding. Trace bilateral ethmoid sinus mucosal thickening. Small volume fluid within a posterior left ethmoid air cell. IMPRESSION: Age-indeterminate small vessel infarct within the left basal ganglia/internal capsule. This may be acute/early subacute given the provided history. Consider a brain MRI for further evaluation. Chronic appearing small vessel infarcts within the bilateral basal ganglia and right thalamocapsular junction. Background mild chronic small vessel ischemic changes within the cerebral white matter. Mild paranasal sinus disease at the imaged levels, as described. Electronically Signed   By: Kellie Simmering DO   On: 04/05/2021 15:51   MR BRAIN WO CONTRAST  Result Date: 04/05/2021 CLINICAL DATA:  Right-sided weakness EXAM: MRI HEAD WITHOUT CONTRAST TECHNIQUE: Multiplanar, multiecho pulse sequences of the brain and surrounding structures were obtained without intravenous contrast. COMPARISON:  None. FINDINGS: Brain: Small focus of abnormal diffusion restriction at the left cerebral peduncle. No other diffusion abnormality. Chronic blood products noted at both basal ganglia. Old small vessel infarcts of the right caudate head and left lentiform nucleus. There is multifocal hyperintense T2-weighted signal within the white matter. Generalized volume loss without a clear lobar predilection. Pituitary gland appears enlarged with craniocaudal diameter of 9 mm. Vascular: Major flow voids are preserved. Skull and upper cervical spine: Normal calvarium and skull base. Visualized upper cervical spine and soft tissues are normal. Sinuses/Orbits:No paranasal sinus fluid levels or advanced mucosal thickening. No mastoid or middle ear effusion. Normal orbits. IMPRESSION: 1. Small focus of acute ischemia at the left cerebral peduncle. No hemorrhage or mass effect. 2. Old small vessel infarcts of the  right caudate head and left lentiform nucleus. 3. Pituitary gland appears enlarged with craniocaudal diameter of 9 mm. This may be a pituitary adenoma. Dedicated MRI of the pituitary gland with and without contrast is recommended for further characterization and can be performed on an outpatient basis. Electronically Signed   By: Ulyses Jarred M.D.   On: 04/05/2021 20:44    PHYSICAL EXAM Physical Exam  Constitutional: Appears well-developed and well-nourished. Cardiovascular: Normal rate and regular rhythm. Respiratory: Effort normal, non-labored breathing Skin: WDI   Neuro: Mental Status: Patient is awake, alert, oriented to person, place, month, year, and situation. Patient is able to give a clear and coherent history. No signs of aphasia or neglect Cranial Nerves: II: Visual Fields are full. Pupils are equal, round, and reactive to light.   III,IV, VI: EOMI without ptosis or diploplia. V: Facial sensation is symmetric to temperature VII: Facial movement with right facial weakness VIII: hearing is intact to voice X: Uvula elevates symmetrically XI: Shoulder shrug is symmetric. XII: tongue is midline without atrophy or fasciculations. Motor: Tone is normal. Bulk is normal. 5/5 strength was present on the left, 4/5 on the right Sensory: Sensation is symmetric to light touch and temperature in the arms and legs. Cerebellar: FNF and HKS are ataxic on the right  ASSESSMENT/PLAN Joshua Case is a 60 y.o. male who began having right-sided weakness last Tuesday.  He states that  he might be slightly better today.  Due to continuing to have symptoms, his daughter finally made him come in to get checked out where an MRI was performed revealing an ischemic infarct in the left cerebral peduncle.  It also shows significant ischemic disease including old small strokes.  Left cerebral peduncle stroke likely due to SVD  Code Stroke: Age-indeterminate small vessel infarct within the left  basal ganglia/internal capsule. This may be acute/early subacute.   Chronic appearing small vessel infarcts within the bilateral basal ganglia and right thalamocapsular junction. Background mild chronic small vessel ischemic changes within the cerebral white matter. MRI: Small focus of acute ischemia at the left cerebral peduncle. Old small vessel infarcts of the right caudate head and left lentiform nucleus. Pituitary gland appears enlarged with craniocaudal diameter of 9 mm. This may be a pituitary adenoma. Dedicated MRI of the pituitary gland with and without contrast is recommended for further characterization and can be performed on an outpatient basis. MRA: No LVO. Moderate to severe right A2 ACA stenosis. Moderate left proximal P2 PCA and right M1 MCA stenosis. Mild right M2 MCA stenosis. Approximately 2 mm and additional 1-2 mm outpouchings arising from the right paraclinoid ICA, 2 mm outpouching arising from the right A2 ACA, and 1-2 mm outpouching arising from a proximal right M2 MCA branch, compatible with aneurysms versus infundibula with vessels not clearly visible by MRA. A CTA could further characterize if clinically indicated. MRA Neck: Nondiagnostic evaluation of the origins/proximal vessels in the lower neck due to artifact. Outside of this area, no evidence of significant (greater than 50%) stenosis in the neck. 2D Echo Pending LDL 158 HgbA1c 6.0 VTE prophylaxis - is recommended    Diet   Diet regular Room service appropriate? Yes; Fluid consistency: Thin   ASA 325 mg and Plavix 75 mg daily x weeks then ASA 81 mg monotherapy  Therapy recommendations:  Home health PT/OT Disposition:  Home   Hypertension Significantly elevated blood pressure in ED Permissive hypertension (OK if < 220/120) but gradually normalize in 5-7 days Long-term BP goal normotensive  Hyperlipidemia Home meds: Crestor 20mg   LDL 158, not at goal < 70 High intensity statin: Increase Crestor to  40  mg Continue statin at discharge  Diabetes type II Controlled HgbA1c 6.0, goal < 7.0 CBGs Recent Labs    04/06/21 0240 04/06/21 0326 04/06/21 0752  GLUCAP 150* 158* 69*   SSI  Other Stroke Risk Factors Substance abuse - UDS:  THC POSITIVE, Cocaine NONE DETECTED. Patient advised to stop using due to stroke risk. Obesity, There is no height or weight on file to calculate BMI., BMI >/= 30 associated with increased stroke risk, recommend weight loss, diet and exercise as appropriate   Other Active Problems AKI: Creatinine 3.14, management per primary team  Abnormal UA, culture pending Possible pituitary adenoma: outpatient evaluation planned  Hospital day # 1  Joshua A Bailey-Modzik, NP-C   To contact Stroke Continuity provider, please refer to [email or web address] After hours, contact General Neurology

## 2021-04-06 NOTE — Progress Notes (Signed)
[facility]  PROGRESS NOTE  Joshua Case PYP:[id number] DOB: [birth year] DOA: 04/05/2021 PCP: [facility]   Brief HPI:   60 year old male with a history of diabetes mellitus type 2, hypertension, sciatica, lumbar spinal stenosis with neurogenic claudication, bilateral osteoarthritis of the hip, bilateral sacroiliac joint dysfunction was brought to hospital after he developed right-sided weakness and right facial droop ast Tuesday, 04/02/2021.  In the ED blood pressure was found to be significantly elevated.  Head CT showed indeterminate small vessel infarct in the left basal ganglia/internal capsule.  Also showed chronic appearing small infarcts within the bilateral basal ganglia and right thalamic capsular junction.  MRI brain showed acute ischemia at the left side with bronchial.  Also showedtitrating concerning for pituitary adenoma.    Subjective   Patient seen and examined, right-sided weakness has improved, still has mild facial droop.    Assessment/Plan:     CVA -Symptom began on 04/02/2021 at 11 AM -Out of tPA window -Neurology consulted; likely has small vessel ischemic stroke in the brainstem -Started on aspirin and Plavix -We will restart home antihypertensive regimen, since he is out of window for permissive hypertension -Follow echocardiogram, MRA head and neck, PT/OT consulted -Neurology following  Hypertensive urgency -Blood pressure has improved -Goal to keep SBP less than 180 and gradually reduce blood pressure -Continue amlodipine, metoprolol -Continue as needed IV hydralazine  Acute kidney injury -BUN is 34, creatinine 3.14; baseline creatinine was 1.5 as of April 2018. -Likely has developed CKD from hypertension and NSAID use at home -Hold HCTZ/valsartan and Voltaren -Follow renal function in a.m.  Diabetes mellitus type 2 -Started on sliding scale insulin with NovoLog -CBG well controlled  Dyspnea on exertion -O2 sats 100% on  room air -We will obtain chest x-ray -Follow echocardiogram results  Abnormal UA -UA shows 6-10 RBCs per hpf; 21-50 WBC per hpf -Patient is asymptomatic -Urine culture ordered, follow results  Enlarged pituitary gland -MRI shows enlarged pituitary gland concerning for pituitary adenoma -He will need a dedicated pituitary protocol MRI with and without contrast for further evaluation as outpatient  Chronic pain syndrome -Continue gabapentin, as needed Tylenol.  Voltaren on hold due to AKI  Hyperlipidemia -Continue rosuvastatin  Scheduled medications:    aspirin  300 mg Rectal Daily   Or   aspirin  325 mg Oral Daily   [START ON 04/07/2021] clopidogrel  75 mg Oral Daily   gabapentin  300 mg Oral QHS   heparin  5,000 Units Subcutaneous Q8H   insulin aspart  0-6 Units Subcutaneous Q4H   rosuvastatin  20 mg Oral Daily         Data Reviewed:   CBG:  Recent Labs  Lab 04/05/21 2315 04/06/21 0240 04/06/21 0326 04/06/21 0752 04/06/21 1158  GLUCAP 49* 150* 158* 69* 91    SpO2: 100 %    Vitals:   04/06/21 0757 04/06/21 0918 04/06/21 1000 04/06/21 1100  BP:  (!) 186/86 (!) 185/87 (!) 194/92  Pulse:  73 65 (!) 58  Resp:  (!) 22 17 14   Temp: 98.5 F (36.9 C)     TempSrc: Oral     SpO2:  100% 99% 100%    No intake or output data in the 24 hours ending 04/06/21 1331  No intake/output data recorded.  There were no vitals filed for this visit.  CBC:  Recent Labs  Lab 04/05/21 1451  WBC 7.1  HGB 11.9*  HCT 38.1*  PLT 289  MCV 87.8  MCH 27.4  MCHC 31.2  RDW 15.4  LYMPHSABS 2.4  MONOABS 0.5  EOSABS 0.3  BASOSABS 0.1    Complete metabolic panel:  Recent Labs  Lab 04/05/21 1451 04/06/21 0341  NA 140  --   K 4.5  --   CL 111  --   CO2 21*  --   GLUCOSE 61*  --   BUN 34*  --   CREATININE 3.14*  --   CALCIUM 8.6*  --   AST 16  --   ALT 15  --   ALKPHOS 66  --   BILITOT 0.7  --   ALBUMIN 3.0*  --   INR 1.0  --   HGBA1C  --  6.0*    No  results for input(s): LIPASE, AMYLASE in the last 168 hours.  Recent Labs  Lab 04/05/21 2131  [place]    ------------------------------------------------------------------------------------------------------------------ Recent Labs    04/06/21 0341  CHOL 212*  HDL 36*  LDLCALC 158*  TRIG 88  CHOLHDL 5.9    Lab Results  Component Value Date   HGBA1C 6.0 (H) 04/06/2021   ------------------------------------------------------------------------------------------------------------------ No results for input(s): TSH, T4TOTAL, T3FREE, THYROIDAB in the last 72 hours.  Invalid input(s): FREET3 ------------------------------------------------------------------------------------------------------------------ Recent Labs    04/06/21 0341  VITAMINB12 641  FOLATE 21.8  FERRITIN 119  TIBC 244*  IRON 160  RETICCTPCT 1.3    Coagulation profile Recent Labs  Lab 04/05/21 1451  INR 1.0   No results for input(s): DDIMER in the last 72 hours.  Cardiac Enzymes No results for input(s): CKTOTAL, CKMB, CKMBINDEX, TROPONINI in the last 168 hours.  ------------------------------------------------------------------------------------------------------------------ No results found for: BNP   Antibiotics: Anti-infectives (From admission, onward)    None        Radiology Reports  CT HEAD WO CONTRAST (5MM)  Result Date: 04/05/2021 CLINICAL DATA:  Neuro deficit, acute, stroke suspected. Additional history provided: Right-sided weakness, right facial droop since Tuesday. History of diabetes and hypertension. EXAM: CT HEAD WITHOUT CONTRAST TECHNIQUE: Contiguous axial images were obtained from the base of the skull through the vertex without intravenous contrast. COMPARISON:  No pertinent prior exams available for comparison. FINDINGS: Brain: Cerebral volume is normal. Chronic small-vessel infarcts within the right basal ganglia and right thalamocapsular junction. There are  two small vessel infarcts within the left basal ganglia/internal capsule, one of which appears chronic. The other has an age-indeterminate appearance, and may be acute/early subacute given the provided history. Background mild patchy and ill-defined hypoattenuation within the cerebral white matter, nonspecific but compatible with chronic small vessel ischemic disease. There is no acute intracranial hemorrhage. No demarcated cortical infarct. No extra-axial fluid collection. No evidence of an intracranial mass. No midline shift. Vascular: No hyperdense vessel.  Atherosclerotic calcifications. Skull: Normal. Negative for fracture or focal lesion. Sinuses/Orbits: Visualized orbits show no acute finding. Trace bilateral ethmoid sinus mucosal thickening. Small volume fluid within a posterior left ethmoid air cell. IMPRESSION: Age-indeterminate small vessel infarct within the left basal ganglia/internal capsule. This may be acute/early subacute given the provided history. Consider a brain MRI for further evaluation. Chronic appearing small vessel infarcts within the bilateral basal ganglia and right thalamocapsular junction. Background mild chronic small vessel ischemic changes within the cerebral white matter. Mild paranasal sinus disease at the imaged levels, as described. Electronically Signed   By: Kellie Simmering DO   On: 04/05/2021 15:51   MR ANGIO HEAD WO CONTRAST  Result Date: 04/06/2021 CLINICAL DATA:  Neuro deficit, acute, stroke suspected; Stroke,  follow up EXAM: MRA NECK WITHOUT CONTRAST MRA HEAD WITHOUT CONTRAST TECHNIQUE: Multiplanar and multiecho pulse sequences of the neck were obtained without intravenous contrast. Angiographic images of the neck were obtained using MRA technique without intravenous contrast; Angiographic images of the Circle of Willis were obtained using MRA technique without intravenous contrast. COMPARISON:  MRI head 04/05/2021.  No vascular imaging comparison. FINDINGS: MRA NECK  FINDINGS Aortic arch: Poorly visualized with great vessel origins appearing patent. Nondiagnostic evaluation for stenosis. Right carotid system: Nondiagnostic evaluation proximally due to extensive artifact patent mid and upper common carotid artery and internal carotid artery without evidence of greater than 50% stenosis. Left carotid system: Nondiagnostic evaluation proximally due to extensive artifact. Patent mid to upper common carotid artery and internal carotid artery without evidence of greater than 50% stenosis. Vertebral arteries: Nondiagnostic evaluation proximally due to extensive artifact. The mid upper vertebral arteries are patent without evidence of greater than 50% stenosis. Mildly right dominant. MRA HEAD FINDINGS Anterior circulation: Hypoplastic or absent left A1 ACA, likely congenital given prominent right A1 ACA. Bilateral intracranial ICAs, MCAs, and ACAs are patent. Moderate right M1 MCA stenosis. Mild right proximal M2 MCA stenosis. Inferior M2 MCA branches not well evaluated due to technique. Moderate to severe right A2 ACA stenosis. Approximately 2 mm outpouching rising from the right A2 ACA laterally (see series 5, image 125). Approximately 2 mm outpouching arising from the right paraclinoid ICA (series 5, image 79). Additional 1-2 mm outpouching arising from the more distal right paraclinoid ICA (series 5, image 84). Approximately 1-2 mm outpouching arising from a proximal right M2 MCA branch (series 1034, image 279; series 5, image 92). Posterior circulation: Bilateral intradural vertebral arteries, basilar artery, and posterior cerebral arteries are patent. Visualized bilateral PICAs are patent. Moderate stenosis of the proximal left M2 MCA. No aneurysm identified. IMPRESSION: MRA Head: 1. No large vessel occlusion. 2. Moderate to severe right A2 ACA stenosis. 3. Moderate left proximal P2 PCA and right M1 MCA stenosis. 4. Mild right M2 MCA stenosis. 5. Approximately 2 mm and additional  1-2 mm outpouchings arising from the right paraclinoid ICA, 2 mm outpouching arising from the right A2 ACA, and 1-2 mm outpouching arising from a proximal right M2 MCA branch, compatible with aneurysms versus infundibula with vessels not clearly visible by MRA. A CTA could further characterize if clinically indicated. MRA Neck: Nondiagnostic evaluation of the origins/proximal vessels in the lower neck due to artifact. Outside of this area, no evidence of significant (greater than 50%) stenosis in the neck. Electronically Signed   By: Margaretha Sheffield MD   On: 04/06/2021 09:56   MR ANGIO NECK WO CONTRAST  Result Date: 04/06/2021 CLINICAL DATA:  Neuro deficit, acute, stroke suspected; Stroke, follow up EXAM: MRA NECK WITHOUT CONTRAST MRA HEAD WITHOUT CONTRAST TECHNIQUE: Multiplanar and multiecho pulse sequences of the neck were obtained without intravenous contrast. Angiographic images of the neck were obtained using MRA technique without intravenous contrast; Angiographic images of the Circle of Willis were obtained using MRA technique without intravenous contrast. COMPARISON:  MRI head 04/05/2021.  No vascular imaging comparison. FINDINGS: MRA NECK FINDINGS Aortic arch: Poorly visualized with great vessel origins appearing patent. Nondiagnostic evaluation for stenosis. Right carotid system: Nondiagnostic evaluation proximally due to extensive artifact patent mid and upper common carotid artery and internal carotid artery without evidence of greater than 50% stenosis. Left carotid system: Nondiagnostic evaluation proximally due to extensive artifact. Patent mid to upper common carotid artery and internal carotid artery without evidence  of greater than 50% stenosis. Vertebral arteries: Nondiagnostic evaluation proximally due to extensive artifact. The mid upper vertebral arteries are patent without evidence of greater than 50% stenosis. Mildly right dominant. MRA HEAD FINDINGS Anterior circulation: Hypoplastic or  absent left A1 ACA, likely congenital given prominent right A1 ACA. Bilateral intracranial ICAs, MCAs, and ACAs are patent. Moderate right M1 MCA stenosis. Mild right proximal M2 MCA stenosis. Inferior M2 MCA branches not well evaluated due to technique. Moderate to severe right A2 ACA stenosis. Approximately 2 mm outpouching rising from the right A2 ACA laterally (see series 5, image 125). Approximately 2 mm outpouching arising from the right paraclinoid ICA (series 5, image 79). Additional 1-2 mm outpouching arising from the more distal right paraclinoid ICA (series 5, image 84). Approximately 1-2 mm outpouching arising from a proximal right M2 MCA branch (series 1034, image 279; series 5, image 92). Posterior circulation: Bilateral intradural vertebral arteries, basilar artery, and posterior cerebral arteries are patent. Visualized bilateral PICAs are patent. Moderate stenosis of the proximal left M2 MCA. No aneurysm identified. IMPRESSION: MRA Head: 1. No large vessel occlusion. 2. Moderate to severe right A2 ACA stenosis. 3. Moderate left proximal P2 PCA and right M1 MCA stenosis. 4. Mild right M2 MCA stenosis. 5. Approximately 2 mm and additional 1-2 mm outpouchings arising from the right paraclinoid ICA, 2 mm outpouching arising from the right A2 ACA, and 1-2 mm outpouching arising from a proximal right M2 MCA branch, compatible with aneurysms versus infundibula with vessels not clearly visible by MRA. A CTA could further characterize if clinically indicated. MRA Neck: Nondiagnostic evaluation of the origins/proximal vessels in the lower neck due to artifact. Outside of this area, no evidence of significant (greater than 50%) stenosis in the neck. Electronically Signed   By: Margaretha Sheffield MD   On: 04/06/2021 09:56   MR BRAIN WO CONTRAST  Result Date: 04/05/2021 CLINICAL DATA:  Right-sided weakness EXAM: MRI HEAD WITHOUT CONTRAST TECHNIQUE: Multiplanar, multiecho pulse sequences of the brain and  surrounding structures were obtained without intravenous contrast. COMPARISON:  None. FINDINGS: Brain: Small focus of abnormal diffusion restriction at the left cerebral peduncle. No other diffusion abnormality. Chronic blood products noted at both basal ganglia. Old small vessel infarcts of the right caudate head and left lentiform nucleus. There is multifocal hyperintense T2-weighted signal within the white matter. Generalized volume loss without a clear lobar predilection. Pituitary gland appears enlarged with craniocaudal diameter of 9 mm. Vascular: Major flow voids are preserved. Skull and upper cervical spine: Normal calvarium and skull base. Visualized upper cervical spine and soft tissues are normal. Sinuses/Orbits:No paranasal sinus fluid levels or advanced mucosal thickening. No mastoid or middle ear effusion. Normal orbits. IMPRESSION: 1. Small focus of acute ischemia at the left cerebral peduncle. No hemorrhage or mass effect. 2. Old small vessel infarcts of the right caudate head and left lentiform nucleus. 3. Pituitary gland appears enlarged with craniocaudal diameter of 9 mm. This may be a pituitary adenoma. Dedicated MRI of the pituitary gland with and without contrast is recommended for further characterization and can be performed on an outpatient basis. Electronically Signed   By: Ulyses Jarred M.D.   On: 04/05/2021 20:44      DVT prophylaxis: Heparin  Code Status: Full code  Family Communication: No family at bedside   Consultants: Neurology  Procedures:     Objective    Physical Examination:   General-appears in no acute distress Heart-S1-S2, regular, no murmur auscultated Lungs-clear to auscultation bilaterally,  no wheezing or crackles auscultated Abdomen-soft, nontender, no organomegaly Extremities-no edema in the lower extremities Neuro-alert, oriented x3, right facial droop, mild weakness of right upper extremity  Status is: Inpatient  Dispo: The patient is  from: Home              Anticipated d/c is to: Home              Anticipated d/c date is: 04/08/2021              Patient currently not stable for discharge  Barrier to discharge-ongoing work-up for CVA  COVID-19 Labs  Recent Labs    04/06/21 0341  FERRITIN 119    Lab Results  Component Value Date   [facility] NEGATIVE 04/05/2021    Microbiology  Recent Results (from the past 240 hour(s))  Resp Panel by RT-PCR (Flu A&B, Covid) Nasopharyngeal Swab     Status: None   Collection Time: 04/05/21  9:31 PM   Specimen: Nasopharyngeal Swab; Nasopharyngeal(NP) swabs in vial transport medium  Result Value Ref Range Status   SARS Coronavirus 2 by RT PCR NEGATIVE NEGATIVE Final    Comment: (NOTE) SARS-CoV-2 target nucleic acids are NOT DETECTED.  The SARS-CoV-2 RNA is generally detectable in upper respiratory specimens during the acute phase of infection. The lowest concentration of SARS-CoV-2 viral copies this assay can detect is 138 copies/mL. A negative result does not preclude SARS-Cov-2 infection and should not be used as the sole basis for treatment or other patient management decisions. A negative result may occur with  improper specimen collection/handling, submission of specimen other than nasopharyngeal swab, presence of viral mutation(s) within the areas targeted by this assay, and inadequate number of viral copies(<138 copies/mL). A negative result must be combined with clinical observations, patient history, and epidemiological information. The expected result is Negative.  Fact Sheet for Patients:  EntrepreneurPulse.com.au  Fact Sheet for Healthcare Providers:  IncredibleEmployment.be  This test is no t yet approved or cleared by the [place] FDA and  has been authorized for detection and/or diagnosis of SARS-CoV-2 by FDA under an Emergency Use Authorization (EUA). This EUA will remain  in effect (meaning this test can be  used) for the duration of the COVID-19 declaration under Section 564(b)(1) of the Act, 21 U.S.C.section 360bbb-3(b)(1), unless the authorization is terminated  or revoked sooner.       Influenza A by PCR NEGATIVE NEGATIVE Final   Influenza B by PCR NEGATIVE NEGATIVE Final    Comment: (NOTE) The Xpert Xpress SARS-CoV-2/FLU/RSV plus assay is intended as an aid in the diagnosis of influenza from Nasopharyngeal swab specimens and should not be used as a sole basis for treatment. Nasal washings and aspirates are unacceptable for Xpert Xpress SARS-CoV-2/FLU/RSV testing.  Fact Sheet for Patients: EntrepreneurPulse.com.au  Fact Sheet for Healthcare Providers: IncredibleEmployment.be  This test is not yet approved or cleared by the [place] FDA and has been authorized for detection and/or diagnosis of SARS-CoV-2 by FDA under an Emergency Use Authorization (EUA). This EUA will remain in effect (meaning this test can be used) for the duration of the COVID-19 declaration under Section 564(b)(1) of the Act, 21 U.S.C. section 360bbb-3(b)(1), unless the authorization is terminated or revoked.  Performed at [facility], [place] [address] [place], [place] [address]          [facility] If 7PM-7AM, please contact night-coverage at [email or web address], Office  [phone]   04/06/2021, 1:31 PM  LOS: 1  day

## 2021-04-06 NOTE — Evaluation (Signed)
Occupational Therapy Evaluation Patient Details Name: Joshua Case MRN: [id number] DOB: 08-15-1961 Today's Date: 04/06/2021    History of Present Illness this 59 y.o. male admitted with Rt sided weakness, and Rt facial droop that started several days PTA.  MRI of brain showed Small focus of acute ischemia at the left cerebral peduncle; Old small vessel infarcts of the right caudate head and left lentiform nucleus.  PMH includes: diabetes mellitus type 2, hypertension, sciatica, lumbar spinal stenosis with neurogenic claudication, bilateral osteoarthritis of the hip, bilateral sacroiliac joint dysfunction   Clinical Impression   Pt admitted with above. He demonstrates the below listed deficits and will benefit from continued OT to maximize safety and independence with BADLs.  Pt presents to OT with Rt UE ataxia, impaired balance, decreased activity tolerance.  He currently requires set up - min A for ADLs and min guard assist for functional transfers.  He reports he lives with his wife and daughter and was fully independent prior to onset of symptoms.  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  Recommend OPOT at discharge.      Follow Up Recommendations  Outpatient OT;Supervision - Intermittent    Equipment Recommendations  Tub/shower seat    Recommendations for Other Services       Precautions / Restrictions Precautions Precautions: Fall      Mobility Bed Mobility Overal bed mobility: Needs Assistance Bed Mobility: Supine to Sit;Sit to Supine     Supine to sit: Supervision Sit to supine: Supervision   General bed mobility comments: on ED stretcher    Transfers                      Balance Overall balance assessment: Needs assistance Sitting-balance support: Feet supported Sitting balance-Leahy Scale: Good     Standing balance support: No upper extremity supported Standing balance-Leahy Scale: Fair Standing balance comment: able  to maintain static standing with min guard assist                           ADL either performed or assessed with clinical judgement   ADL Overall ADL's : Needs assistance/impaired Eating/Feeding: Set up;Bed level Eating/Feeding Details (indicate cue type and reason): Pt reports he was able to feed self today with minimal spillage.  He reports improved ability to perform this task since onset of symptoms Grooming: Wash/dry hands;Wash/dry face;Oral care;Brushing hair;Set up;Sitting   Upper Body Bathing: Supervision/ safety;Set up;Sitting   Lower Body Bathing: Min guard;Sit to/from stand   Upper Body Dressing : Minimal assistance;Sitting Upper Body Dressing Details (indicate cue type and reason): assist for fasteners Lower Body Dressing: Minimal assistance;Sit to/from stand   Toilet Transfer: Min guard;Stand-pivot;BSC [facility])   Toileting- Water quality scientist and Hygiene: Minimal assistance;Sit to/from stand Toileting - Clothing Manipulation Details (indicate cue type and reason): assist for fasteners     Functional mobility during ADLs: Min guard       Vision Baseline Vision/History:  (daughter indicates pt should wear/have glasses, but doesn't) Patient Visual Report: No change from baseline Vision Assessment?: Yes Eye Alignment: Within Functional Limits Ocular Range of Motion: Within Functional Limits Alignment/Gaze Preference: Within Defined Limits Tracking/Visual Pursuits: Able to track stimulus in all quads without difficulty Convergence: Within functional limits Visual Fields: No apparent deficits     Agricultural engineer 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?: Yes   Praxis Praxis Praxis tested?: Within functional limits    Pertinent Vitals/Pain Pain Assessment:  No/denies pain     Hand Dominance Right   Extremity/Trunk Assessment Upper Extremity Assessment Upper Extremity Assessment: RUE deficits/detail RUE Deficits / Details: dysmetria noted RUE Sensation: decreased  proprioception RUE Coordination: decreased fine motor;decreased gross motor   Lower Extremity Assessment Lower Extremity Assessment: Defer to PT evaluation       Communication Communication Communication: No difficulties (low volume)   Cognition Arousal/Alertness: Awake/alert Behavior During Therapy: WFL for tasks assessed/performed Overall Cognitive Status: Within Functional Limits for tasks assessed                                 General Comments: grossly assessed.  Would benefit from further assessment   General Comments  BP 205/82 RN aware    Exercises     Shoulder Instructions      Home Living Family/patient expects to be discharged to:: Private residence Living Arrangements: Spouse/significant other;Children Available Help at Discharge: Family Type of Home: House Home Access: Stairs to enter Technical brewer of Steps: 1 Entrance Stairs-Rails: None Home Layout: One level     Bathroom Shower/Tub: Tub/shower unit;Walk-in shower   Bathroom Toilet: Handicapped height     Home Equipment: Cane - single point          Prior Functioning/Environment Level of Independence: Independent        Comments: ambulates with SPC since onset of symptoms and endorses 2-3 falls since onset of symptoms. Prior to that he was fully independent without use of AD. He Drives.  likes watching sports.  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 Has been using SPC this week due to falls that occurred this week        OT Problem List: Decreased activity tolerance;Impaired balance (sitting and/or standing);Decreased coordination;Decreased knowledge of use of DME or AE;Obesity      OT Treatment/Interventions: Self-care/ADL training;Neuromuscular education;DME and/or AE instruction;Therapeutic activities;Patient/family education;Balance training    OT Goals(Current goals can be found in the care plan section) Acute Rehab  OT Goals Patient Stated Goal: to get back to work OT Goal Formulation: With patient Time For Goal Achievement: 04/20/21 Potential to Achieve Goals: Good ADL Goals Pt Will Perform Grooming: with supervision;standing Pt Will Perform Upper Body Dressing: with supervision;sitting Pt Will Perform Lower Body Dressing: with supervision;sit to/from stand Pt Will Transfer to Toilet: with supervision;ambulating;regular height toilet;grab bars Pt Will Perform Toileting - Clothing Manipulation and hygiene: with supervision;sit to/from stand Pt/caregiver will Perform Home Exercise Program: Right Upper extremity;Increased ROM;With written HEP provided  OT Frequency: Min 2X/week   Barriers to D/C:            Co-evaluation              AM-PAC OT "6 Clicks" Daily Activity     Outcome Measure Help from another person eating meals?: A Floor Help from another person taking care of personal grooming?: A Schuff Help from another person toileting, which includes using toliet, bedpan, or urinal?: A Agudelo Help from another person bathing (including washing, rinsing, drying)?: A Devito Help from another person to put on and taking off regular upper body clothing?: A Pieroni Help from another person to put on and taking off regular lower body clothing?: A Kienitz 6 Click Score: 18   End of Session Equipment Utilized During Treatment: Other (comment) [facility]) Nurse Communication: Mobility status  Activity Tolerance: Patient tolerated treatment well Patient left: in bed;with call bell/phone  within reach;with family/visitor present  OT Visit Diagnosis: Unsteadiness on feet (R26.81);Ataxia, unspecified (R27.0)                Time: 7837-5423 OT Time Calculation (min): 22 min Charges:  OT General Charges $OT Visit: 1 Visit OT Evaluation $OT Eval Moderate Complexity: 1 Mod  Nilsa Nutting., OTR/L Acute Rehabilitation Services Pager [phone] Office [phone]   Lucille Passy M 04/06/2021, 2:28 PM

## 2021-04-06 NOTE — ED Notes (Signed)
Pt given juice with breakfast for CBG of 69.

## 2021-04-06 NOTE — ED Notes (Signed)
Regular lunch tray ordered 

## 2021-04-06 NOTE — Evaluation (Signed)
Physical Therapy Evaluation Patient Details Name: Joshua Case MRN: [id number] DOB: December 11, 1960 Today's Date: 04/06/2021   History of Present Illness  60 y.o. male admitted with Rt sided weakness, and Rt facial droop that started several days PTA.  MRI of brain showed Small focus of acute ischemia at the left cerebral peduncle; Old small vessel infarcts of the right caudate head and left lentiform nucleus.  PMH includes: diabetes mellitus type 2, hypertension, sciatica, lumbar spinal stenosis with neurogenic claudication, bilateral osteoarthritis of the hip, bilateral sacroiliac joint dysfunction  Clinical Impression  Pt was seen for mobility on [facility] but is potentially going to need to switch to RW.  Evaluation of higher level balance skills shows LOB laterally to stand tandem and on one LE, but will expect him to recover these skills with practice.  Focus acutely on high level balance, assessment of his need for AD such as RW instead of SPC and work on gait quality with distances.  Pt is hoping to return to a very physical job, and will have him get guidance from MD about this time frame.    Follow Up Recommendations Home health PT    Equipment Recommendations  None recommended by PT    Recommendations for Other Services       Precautions / Restrictions Precautions Precautions: Fall      Mobility  Bed Mobility Overal bed mobility: Needs Assistance Bed Mobility: Supine to Sit;Sit to Supine     Supine to sit: Supervision Sit to supine: Supervision        Transfers Overall transfer level: Modified independent Equipment used: None                Ambulation/Gait Ambulation/Gait assistance: Min guard Gait Distance (Feet): 75 Feet Assistive device: Straight cane Gait Pattern/deviations: Step-to pattern;Step-through pattern;Wide base of support;Decreased stride length Gait velocity: reduced Gait velocity interpretation: <1.31 ft/sec, indicative of household  ambulator General Gait Details: pt is laterally unstable, tends to drift toward obstacles and required a minor cue for recovery and avoidance  Stairs            Wheelchair Mobility    Modified Rankin (Stroke Patients Only) Modified Rankin (Stroke Patients Only) Pre-Morbid Rankin Score: No symptoms Modified Rankin: Moderately severe disability     Balance Overall balance assessment: Needs assistance Sitting-balance support: Feet supported Sitting balance-Leahy Scale: Good     Standing balance support: Single extremity supported Standing balance-Leahy Scale: Fair Standing balance comment: requires cane for dynamic balance                             Pertinent Vitals/Pain Pain Assessment: No/denies pain    Home Living Family/patient expects to be discharged to:: Private residence Living Arrangements: Spouse/significant other;Children Available Help at Discharge: Family Type of Home: House Home Access: Stairs to enter Entrance Stairs-Rails: None Entrance Stairs-Number of Steps: 1 Home Layout: One level Home Equipment: Cane - single point      Prior Function Level of Independence: Independent         Comments: no falls until recently and began using SPC, denies dizziness.  Works lifting for Regulatory affairs officer   Dominant Hand: Right    Extremity/Trunk Assessment   Upper Extremity Assessment Upper Extremity Assessment: Defer to OT evaluation    Lower Extremity Assessment Lower Extremity Assessment: Generalized weakness (RLE balance issues)    Cervical / Trunk Assessment Cervical / Trunk Assessment: Normal  Communication   Communication: No difficulties  Cognition Arousal/Alertness: Awake/alert Behavior During Therapy: WFL for tasks assessed/performed Overall Cognitive Status: Within Functional Limits for tasks assessed                                        General Comments General comments (skin  integrity, edema, etc.): elevation of BP    Exercises     Assessment/Plan    PT Assessment Patient needs continued PT services  PT Problem List Decreased strength;Decreased balance;Decreased coordination       PT Treatment Interventions DME instruction;Gait training;Stair training;Functional mobility training;Therapeutic activities;Therapeutic exercise;Balance training;Neuromuscular re-education;Patient/family education    PT Goals (Current goals can be found in the Care Plan section)  Acute Rehab PT Goals Patient Stated Goal: to get back to work PT Goal Formulation: With patient Time For Goal Achievement: 04/20/21 Potential to Achieve Goals: Good    Frequency Min 4X/week   Barriers to discharge Inaccessible home environment has stairs    Co-evaluation               AM-PAC PT "6 Clicks" Mobility  Outcome Measure Help needed turning from your back to your side while in a flat bed without using bedrails?: A Stapel Help needed moving from lying on your back to sitting on the side of a flat bed without using bedrails?: A Beddow Help needed moving to and from a bed to a chair (including a wheelchair)?: A Binsfeld Help needed standing up from a chair using your arms (e.g., wheelchair or bedside chair)?: A Woehrle Help needed to walk in hospital room?: A Dark Help needed climbing 3-5 steps with a railing? : A Lot 6 Click Score: 17    End of Session Equipment Utilized During Treatment: Gait belt Activity Tolerance: Patient limited by fatigue;Treatment limited secondary to medical complications (Comment) Patient left: in bed;with call bell/phone within reach;with family/visitor present Nurse Communication: Mobility status PT Visit Diagnosis: Unsteadiness on feet (R26.81);Muscle weakness (generalized) (M62.81);Difficulty in walking, not elsewhere classified (R26.2);Ataxic gait (R26.0)    Time: 1420-1444 PT Time Calculation (min) (ACUTE ONLY): 24 min   Charges:   PT  Evaluation $PT Eval Moderate Complexity: 1 Mod PT Treatments $Gait Training: 8-22 mins       Ramond Dial 04/06/2021, 4:08 PM Mee Hives, PT MS Acute Rehab Dept. Number: [place]

## 2021-04-06 NOTE — ED Notes (Signed)
Patient transported to MRI 

## 2021-04-07 ENCOUNTER — Other Ambulatory Visit (HOSPITAL_COMMUNITY): Payer: Federal, State, Local not specified - PPO

## 2021-04-07 ENCOUNTER — Inpatient Hospital Stay (HOSPITAL_COMMUNITY): Payer: Federal, State, Local not specified - PPO

## 2021-04-07 DIAGNOSIS — I6389 Other cerebral infarction: Secondary | ICD-10-CM

## 2021-04-07 DIAGNOSIS — E1159 Type 2 diabetes mellitus with other circulatory complications: Secondary | ICD-10-CM

## 2021-04-07 LAB — ECHOCARDIOGRAM COMPLETE
AR max vel: 3.48 cm2
AV Area VTI: 3.27 cm2
AV Area mean vel: 3.1 cm2
AV Mean grad: 4 mmHg
AV Peak grad: 7.1 mmHg
Ao pk vel: 1.33 m/s
Area-P 1/2: 3.54 cm2
Height: 75 in
S' Lateral: 3.4 cm
Weight: 4888.92 oz

## 2021-04-07 LAB — BASIC METABOLIC PANEL
Anion gap: 5 (ref 5–15)
BUN: 29 mg/dL — ABNORMAL HIGH (ref 6–20)
CO2: 22 mmol/L (ref 22–32)
Calcium: 8.5 mg/dL — ABNORMAL LOW (ref 8.9–10.3)
Chloride: 109 mmol/L (ref 98–111)
Creatinine, Ser: 2.52 mg/dL — ABNORMAL HIGH (ref 0.61–1.24)
GFR, Estimated: 29 mL/min — ABNORMAL LOW (ref >60)
Glucose, Bld: 89 mg/dL (ref 70–99)
Potassium: 4.7 mmol/L (ref 3.5–5.1)
Sodium: 136 mmol/L (ref 135–145)

## 2021-04-07 LAB — CBC
HCT: 35.9 % — ABNORMAL LOW (ref 39.0–52.0)
Hemoglobin: 11.3 g/dL — ABNORMAL LOW (ref 13.0–17.0)
MCH: 27.6 pg (ref 26.0–34.0)
MCHC: 31.5 g/dL (ref 30.0–36.0)
MCV: 87.8 fL (ref 80.0–100.0)
Platelets: 263 10*3/uL (ref 150–400)
RBC: 4.09 MIL/uL — ABNORMAL LOW (ref 4.22–5.81)
RDW: 15.3 % (ref 11.5–15.5)
WBC: 6.9 10*3/uL (ref 4.0–10.5)
nRBC: 0 % (ref 0.0–0.2)

## 2021-04-07 LAB — GLUCOSE, CAPILLARY
Glucose-Capillary: 87 mg/dL (ref 70–99)
Glucose-Capillary: 147 mg/dL — ABNORMAL HIGH (ref 70–99)
Glucose-Capillary: 134 mg/dL — ABNORMAL HIGH (ref 70–99)
Glucose-Capillary: 124 mg/dL — ABNORMAL HIGH (ref 70–99)
Glucose-Capillary: 150 mg/dL — ABNORMAL HIGH (ref 70–99)

## 2021-04-07 NOTE — Progress Notes (Addendum)
[facility]  PROGRESS NOTE  Joshua Case QQP:[id number] DOB: [birth year] DOA: 04/05/2021 PCP: [facility]   Brief HPI:   60 year old male with a history of diabetes mellitus type 2, hypertension, sciatica, lumbar spinal stenosis with neurogenic claudication, bilateral osteoarthritis of the hip, bilateral sacroiliac joint dysfunction was brought to hospital after he developed right-sided weakness and right facial droop ast Tuesday, 04/02/2021.  In the ED blood pressure was found to be significantly elevated.  Head CT showed indeterminate small vessel infarct in the left basal ganglia/internal capsule.  Also showed chronic appearing small infarcts within the bilateral basal ganglia and right thalamic capsular junction.  MRI brain showed acute ischemia at the left side with bronchial.  Also showedtitrating concerning for pituitary adenoma.    Subjective   Patient seen and examined, right upper extremity has significantly improved.  Still has mild right-sided facial droop.   Assessment/Plan:     CVA -Symptom began on 04/02/2021 at 11 AM -Out of tPA window -Neurology consulted; likely has small vessel ischemic stroke in the brainstem -Started on aspirin and Plavix; neurology recommends to continue with aspirin 81 and Plavix 75 mg DAPT for 3 weeks and then aspirin alone.  Continue Crestor 20 mg daily. -Echocardiogram is pending; MRA head and neck showed severe right A2, left P2 and right M1 stenosis -Neurology has signed off -Patient to go home with home PT/OT  Hypertensive urgency -Resolved -Started back on home regimen; no need for permissive hypertension as patient's symptoms started on 04/02/2021  Acute kidney injury -BUN is 34, creatinine 3.14; baseline creatinine was 1.5 as of April 2018. -Likely has developed CKD from hypertension and NSAID use at home -Today creatinine has improved to 2.52 with IV normal saline -Continue to hold HCTZ/valsartan and  Voltaren -Follow renal function in a.m.  Diabetes mellitus type 2 -Started on sliding scale insulin with NovoLog -CBG well controlled  Dyspnea on exertion -O2 sats 100% on room air -We will obtain chest x-ray -Follow echocardiogram results  Abnormal UA -UA shows 6-10 RBCs per hpf; 21-50 WBC per hpf -Patient is asymptomatic -Urine culture ordered, follow results  Enlarged pituitary gland -MRI shows enlarged pituitary gland concerning for pituitary adenoma -He will need a dedicated pituitary protocol MRI with and without contrast for further evaluation as outpatient  Chronic pain syndrome -Continue gabapentin, as needed Tylenol.  Voltaren on hold due to AKI  Hyperlipidemia -Continue rosuvastatin  Scheduled medications:    amLODipine  10 mg Oral Daily   aspirin  300 mg Rectal Daily   Or   aspirin  325 mg Oral Daily   clopidogrel  75 mg Oral Daily   gabapentin  300 mg Oral QHS   heparin  5,000 Units Subcutaneous Q8H   insulin aspart  0-6 Units Subcutaneous Q4H   metoprolol succinate  100 mg Oral BID   rosuvastatin  20 mg Oral Daily         Data Reviewed:   CBG:  Recent Labs  Lab 04/06/21 1606 04/06/21 1957 04/06/21 2358 04/07/21 0358 04/07/21 0825  GLUCAP 140* 123* 84 87 147*    SpO2: 98 %    Vitals:   04/06/21 1959 04/07/21 0001 04/07/21 0409 04/07/21 0828  BP: (!) 178/86 (!) 185/90 (!) 199/86 (!) 169/91  Pulse: 60 65 (!) 59 62  Resp: 19 17 16 17   Temp: 98.3 F (36.8 C) 98.2 F (36.8 C) 98.3 F (36.8 C) 98.5 F (36.9 C)  TempSrc: Oral Oral Oral Oral  SpO2: 100% 100% 100% 98%  Weight:      Height:         Intake/Output Summary (Last 24 hours) at 04/07/2021 1100 Last data filed at 04/07/2021 0730 Gross per 24 hour  Intake 1304 ml  Output 2400 ml  Net -1096 ml    08/05 1901 - 08/07 0700 In: 2355 [P.O.:264; I.V.:1040] Out: 2200 [Urine:2200]  Filed Weights   04/06/21 1610  Weight: (!) 138.6 kg    CBC:  Recent Labs  Lab  04/05/21 1451 04/07/21 0021  WBC 7.1 6.9  HGB 11.9* 11.3*  HCT 38.1* 35.9*  PLT 289 263  MCV 87.8 87.8  MCH 27.4 27.6  MCHC 31.2 31.5  RDW 15.4 15.3  LYMPHSABS 2.4  --   MONOABS 0.5  --   EOSABS 0.3  --   BASOSABS 0.1  --     Complete metabolic panel:  Recent Labs  Lab 04/05/21 1451 04/06/21 0341 04/07/21 0021  NA 140  --  136  K 4.5  --  4.7  CL 111  --  109  CO2 21*  --  22  GLUCOSE 61*  --  89  BUN 34*  --  29*  CREATININE 3.14*  --  2.52*  CALCIUM 8.6*  --  8.5*  AST 16  --   --   ALT 15  --   --   ALKPHOS 66  --   --   BILITOT 0.7  --   --   ALBUMIN 3.0*  --   --   INR 1.0  --   --   HGBA1C  --  6.0*  --     No results for input(s): LIPASE, AMYLASE in the last 168 hours.  Recent Labs  Lab 04/05/21 2131  [place]    ------------------------------------------------------------------------------------------------------------------ Recent Labs    04/06/21 0341  CHOL 212*  HDL 36*  LDLCALC 158*  TRIG 88  CHOLHDL 5.9    Lab Results  Component Value Date   HGBA1C 6.0 (H) 04/06/2021   ------------------------------------------------------------------------------------------------------------------ No results for input(s): TSH, T4TOTAL, T3FREE, THYROIDAB in the last 72 hours.  Invalid input(s): FREET3 ------------------------------------------------------------------------------------------------------------------ Recent Labs    04/06/21 0341  VITAMINB12 641  FOLATE 21.8  FERRITIN 119  TIBC 244*  IRON 160  RETICCTPCT 1.3    Coagulation profile Recent Labs  Lab 04/05/21 1451  INR 1.0   No results for input(s): DDIMER in the last 72 hours.  Cardiac Enzymes No results for input(s): CKTOTAL, CKMB, CKMBINDEX, TROPONINI in the last 168 hours.  ------------------------------------------------------------------------------------------------------------------ No results found for: BNP   Antibiotics: Anti-infectives (From  admission, onward)    None        Radiology Reports  CT HEAD WO CONTRAST (5MM)  Result Date: 04/05/2021 CLINICAL DATA:  Neuro deficit, acute, stroke suspected. Additional history provided: Right-sided weakness, right facial droop since Tuesday. History of diabetes and hypertension. EXAM: CT HEAD WITHOUT CONTRAST TECHNIQUE: Contiguous axial images were obtained from the base of the skull through the vertex without intravenous contrast. COMPARISON:  No pertinent prior exams available for comparison. FINDINGS: Brain: Cerebral volume is normal. Chronic small-vessel infarcts within the right basal ganglia and right thalamocapsular junction. There are two small vessel infarcts within the left basal ganglia/internal capsule, one of which appears chronic. The other has an age-indeterminate appearance, and may be acute/early subacute given the provided history. Background mild patchy and ill-defined hypoattenuation within the cerebral white matter, nonspecific but compatible with chronic small vessel ischemic disease.  There is no acute intracranial hemorrhage. No demarcated cortical infarct. No extra-axial fluid collection. No evidence of an intracranial mass. No midline shift. Vascular: No hyperdense vessel.  Atherosclerotic calcifications. Skull: Normal. Negative for fracture or focal lesion. Sinuses/Orbits: Visualized orbits show no acute finding. Trace bilateral ethmoid sinus mucosal thickening. Small volume fluid within a posterior left ethmoid air cell. IMPRESSION: Age-indeterminate small vessel infarct within the left basal ganglia/internal capsule. This may be acute/early subacute given the provided history. Consider a brain MRI for further evaluation. Chronic appearing small vessel infarcts within the bilateral basal ganglia and right thalamocapsular junction. Background mild chronic small vessel ischemic changes within the cerebral white matter. Mild paranasal sinus disease at the imaged levels, as  described. Electronically Signed   By: Kellie Simmering DO   On: 04/05/2021 15:51   MR ANGIO HEAD WO CONTRAST  Result Date: 04/06/2021 CLINICAL DATA:  Neuro deficit, acute, stroke suspected; Stroke, follow up EXAM: MRA NECK WITHOUT CONTRAST MRA HEAD WITHOUT CONTRAST TECHNIQUE: Multiplanar and multiecho pulse sequences of the neck were obtained without intravenous contrast. Angiographic images of the neck were obtained using MRA technique without intravenous contrast; Angiographic images of the Circle of Willis were obtained using MRA technique without intravenous contrast. COMPARISON:  MRI head 04/05/2021.  No vascular imaging comparison. FINDINGS: MRA NECK FINDINGS Aortic arch: Poorly visualized with great vessel origins appearing patent. Nondiagnostic evaluation for stenosis. Right carotid system: Nondiagnostic evaluation proximally due to extensive artifact patent mid and upper common carotid artery and internal carotid artery without evidence of greater than 50% stenosis. Left carotid system: Nondiagnostic evaluation proximally due to extensive artifact. Patent mid to upper common carotid artery and internal carotid artery without evidence of greater than 50% stenosis. Vertebral arteries: Nondiagnostic evaluation proximally due to extensive artifact. The mid upper vertebral arteries are patent without evidence of greater than 50% stenosis. Mildly right dominant. MRA HEAD FINDINGS Anterior circulation: Hypoplastic or absent left A1 ACA, likely congenital given prominent right A1 ACA. Bilateral intracranial ICAs, MCAs, and ACAs are patent. Moderate right M1 MCA stenosis. Mild right proximal M2 MCA stenosis. Inferior M2 MCA branches not well evaluated due to technique. Moderate to severe right A2 ACA stenosis. Approximately 2 mm outpouching rising from the right A2 ACA laterally (see series 5, image 125). Approximately 2 mm outpouching arising from the right paraclinoid ICA (series 5, image 79). Additional 1-2 mm  outpouching arising from the more distal right paraclinoid ICA (series 5, image 84). Approximately 1-2 mm outpouching arising from a proximal right M2 MCA branch (series 1034, image 279; series 5, image 92). Posterior circulation: Bilateral intradural vertebral arteries, basilar artery, and posterior cerebral arteries are patent. Visualized bilateral PICAs are patent. Moderate stenosis of the proximal left M2 MCA. No aneurysm identified. IMPRESSION: MRA Head: 1. No large vessel occlusion. 2. Moderate to severe right A2 ACA stenosis. 3. Moderate left proximal P2 PCA and right M1 MCA stenosis. 4. Mild right M2 MCA stenosis. 5. Approximately 2 mm and additional 1-2 mm outpouchings arising from the right paraclinoid ICA, 2 mm outpouching arising from the right A2 ACA, and 1-2 mm outpouching arising from a proximal right M2 MCA branch, compatible with aneurysms versus infundibula with vessels not clearly visible by MRA. A CTA could further characterize if clinically indicated. MRA Neck: Nondiagnostic evaluation of the origins/proximal vessels in the lower neck due to artifact. Outside of this area, no evidence of significant (greater than 50%) stenosis in the neck. Electronically Signed   By: Margaretha Sheffield  MD   On: 04/06/2021 09:56   MR ANGIO NECK WO CONTRAST  Result Date: 04/06/2021 CLINICAL DATA:  Neuro deficit, acute, stroke suspected; Stroke, follow up EXAM: MRA NECK WITHOUT CONTRAST MRA HEAD WITHOUT CONTRAST TECHNIQUE: Multiplanar and multiecho pulse sequences of the neck were obtained without intravenous contrast. Angiographic images of the neck were obtained using MRA technique without intravenous contrast; Angiographic images of the Circle of Willis were obtained using MRA technique without intravenous contrast. COMPARISON:  MRI head 04/05/2021.  No vascular imaging comparison. FINDINGS: MRA NECK FINDINGS Aortic arch: Poorly visualized with great vessel origins appearing patent. Nondiagnostic evaluation  for stenosis. Right carotid system: Nondiagnostic evaluation proximally due to extensive artifact patent mid and upper common carotid artery and internal carotid artery without evidence of greater than 50% stenosis. Left carotid system: Nondiagnostic evaluation proximally due to extensive artifact. Patent mid to upper common carotid artery and internal carotid artery without evidence of greater than 50% stenosis. Vertebral arteries: Nondiagnostic evaluation proximally due to extensive artifact. The mid upper vertebral arteries are patent without evidence of greater than 50% stenosis. Mildly right dominant. MRA HEAD FINDINGS Anterior circulation: Hypoplastic or absent left A1 ACA, likely congenital given prominent right A1 ACA. Bilateral intracranial ICAs, MCAs, and ACAs are patent. Moderate right M1 MCA stenosis. Mild right proximal M2 MCA stenosis. Inferior M2 MCA branches not well evaluated due to technique. Moderate to severe right A2 ACA stenosis. Approximately 2 mm outpouching rising from the right A2 ACA laterally (see series 5, image 125). Approximately 2 mm outpouching arising from the right paraclinoid ICA (series 5, image 79). Additional 1-2 mm outpouching arising from the more distal right paraclinoid ICA (series 5, image 84). Approximately 1-2 mm outpouching arising from a proximal right M2 MCA branch (series 1034, image 279; series 5, image 92). Posterior circulation: Bilateral intradural vertebral arteries, basilar artery, and posterior cerebral arteries are patent. Visualized bilateral PICAs are patent. Moderate stenosis of the proximal left M2 MCA. No aneurysm identified. IMPRESSION: MRA Head: 1. No large vessel occlusion. 2. Moderate to severe right A2 ACA stenosis. 3. Moderate left proximal P2 PCA and right M1 MCA stenosis. 4. Mild right M2 MCA stenosis. 5. Approximately 2 mm and additional 1-2 mm outpouchings arising from the right paraclinoid ICA, 2 mm outpouching arising from the right A2 ACA,  and 1-2 mm outpouching arising from a proximal right M2 MCA branch, compatible with aneurysms versus infundibula with vessels not clearly visible by MRA. A CTA could further characterize if clinically indicated. MRA Neck: Nondiagnostic evaluation of the origins/proximal vessels in the lower neck due to artifact. Outside of this area, no evidence of significant (greater than 50%) stenosis in the neck. Electronically Signed   By: Margaretha Sheffield MD   On: 04/06/2021 09:56   MR BRAIN WO CONTRAST  Result Date: 04/05/2021 CLINICAL DATA:  Right-sided weakness EXAM: MRI HEAD WITHOUT CONTRAST TECHNIQUE: Multiplanar, multiecho pulse sequences of the brain and surrounding structures were obtained without intravenous contrast. COMPARISON:  None. FINDINGS: Brain: Small focus of abnormal diffusion restriction at the left cerebral peduncle. No other diffusion abnormality. Chronic blood products noted at both basal ganglia. Old small vessel infarcts of the right caudate head and left lentiform nucleus. There is multifocal hyperintense T2-weighted signal within the white matter. Generalized volume loss without a clear lobar predilection. Pituitary gland appears enlarged with craniocaudal diameter of 9 mm. Vascular: Major flow voids are preserved. Skull and upper cervical spine: Normal calvarium and skull base. Visualized upper cervical spine and  soft tissues are normal. Sinuses/Orbits:No paranasal sinus fluid levels or advanced mucosal thickening. No mastoid or middle ear effusion. Normal orbits. IMPRESSION: 1. Small focus of acute ischemia at the left cerebral peduncle. No hemorrhage or mass effect. 2. Old small vessel infarcts of the right caudate head and left lentiform nucleus. 3. Pituitary gland appears enlarged with craniocaudal diameter of 9 mm. This may be a pituitary adenoma. Dedicated MRI of the pituitary gland with and without contrast is recommended for further characterization and can be performed on an outpatient  basis. Electronically Signed   By: Ulyses Jarred M.D.   On: 04/05/2021 20:44      DVT prophylaxis: Heparin  Code Status: Full code  Family Communication: No family at bedside   Consultants: Neurology  Procedures:     Objective    Physical Examination:   General-appears in no acute distress Heart-S1-S2, regular, no murmur auscultated Lungs-clear to auscultation bilaterally, no wheezing or crackles auscultated Abdomen-soft, nontender, no organomegaly Extremities-no edema in the lower extremities Neuro-alert, oriented x3, mild weakness of right upper extremity, mild right-sided facial droop  Status is: Inpatient  Dispo: The patient is from: Home              Anticipated d/c is to: Home              Anticipated d/c date is: 04/08/2021              Patient currently not stable for discharge  Barrier to discharge-ongoing work-up for CVA  COVID-19 Labs  Recent Labs    04/06/21 0341  FERRITIN 119    Lab Results  Component Value Date   [place] NEGATIVE 04/05/2021    Microbiology  Recent Results (from the past 240 hour(s))  Resp Panel by RT-PCR (Flu A&B, Covid) Nasopharyngeal Swab     Status: None   Collection Time: 04/05/21  9:31 PM   Specimen: Nasopharyngeal Swab; Nasopharyngeal(NP) swabs in vial transport medium  Result Value Ref Range Status   SARS Coronavirus 2 by RT PCR NEGATIVE NEGATIVE Final    Comment: (NOTE) SARS-CoV-2 target nucleic acids are NOT DETECTED.  The SARS-CoV-2 RNA is generally detectable in upper respiratory specimens during the acute phase of infection. The lowest concentration of SARS-CoV-2 viral copies this assay can detect is 138 copies/mL. A negative result does not preclude SARS-Cov-2 infection and should not be used as the sole basis for treatment or other patient management decisions. A negative result may occur with  improper specimen collection/handling, submission of specimen other than nasopharyngeal swab, presence of  viral mutation(s) within the areas targeted by this assay, and inadequate number of viral copies(<138 copies/mL). A negative result must be combined with clinical observations, patient history, and epidemiological information. The expected result is Negative.  Fact Sheet for Patients:  EntrepreneurPulse.com.au  Fact Sheet for Healthcare Providers:  IncredibleEmployment.be  This test is no t yet approved or cleared by the [place] FDA and  has been authorized for detection and/or diagnosis of SARS-CoV-2 by FDA under an Emergency Use Authorization (EUA). This EUA will remain  in effect (meaning this test can be used) for the duration of the COVID-19 declaration under Section 564(b)(1) of the Act, 21 U.S.C.section 360bbb-3(b)(1), unless the authorization is terminated  or revoked sooner.       Influenza A by PCR NEGATIVE NEGATIVE Final   Influenza B by PCR NEGATIVE NEGATIVE Final    Comment: (NOTE) The Xpert Xpress SARS-CoV-2/FLU/RSV plus assay is intended as an aid in  the diagnosis of influenza from Nasopharyngeal swab specimens and should not be used as a sole basis for treatment. Nasal washings and aspirates are unacceptable for Xpert Xpress SARS-CoV-2/FLU/RSV testing.  Fact Sheet for Patients: EntrepreneurPulse.com.au  Fact Sheet for Healthcare Providers: IncredibleEmployment.be  This test is not yet approved or cleared by the [place] FDA and has been authorized for detection and/or diagnosis of SARS-CoV-2 by FDA under an Emergency Use Authorization (EUA). This EUA will remain in effect (meaning this test can be used) for the duration of the COVID-19 declaration under Section 564(b)(1) of the Act, 21 U.S.C. section 360bbb-3(b)(1), unless the authorization is terminated or revoked.  Performed at [facility], [place] [address] [place],  [address]          [facility] If 7PM-7AM, please contact night-coverage at [email or web address], Office  [phone]   04/07/2021, 11:00 AM  LOS: 2 days

## 2021-04-07 NOTE — Progress Notes (Signed)
Physical Therapy Treatment Patient Details Name: Joshua Case MRN: [id number] DOB: [birth year] Today's Date: 04/07/2021    History of Present Illness 60 y.o. male admitted with Rt sided weakness, and Rt facial droop that started several days PTA.  MRI of brain showed Small focus of acute ischemia at the left cerebral peduncle; Old small vessel infarcts of the right caudate head and left lentiform nucleus.  PMH includes: diabetes mellitus type 2, hypertension, sciatica, lumbar spinal stenosis with neurogenic claudication, bilateral osteoarthritis of the hip, bilateral sacroiliac joint dysfunction    PT Comments    Patient received in bed, pleasant and cooperative. Reports his right side is feeling stronger, with most weakness still in his arm. Able to mobilize well today- did need a bit more support to boost up from lower surface, but once on his feet really did well walking with and without SPC. Does not have lateral instability but does appear to possibly have mild R sided inattention and needed cues for obstacle avoidance and navigation on the right. Tells me he has had no visual changes since his CVA. Left up in recliner with all needs met this afternoon. Doing much better as compared to eval, would really benefit much more from skilled OP PT services moving forward.    Follow Up Recommendations  Outpatient PT     Equipment Recommendations  None recommended by PT    Recommendations for Other Services       Precautions / Restrictions Precautions Precautions: Fall Precaution Comments: R weakness UE>LE Restrictions Weight Bearing Restrictions: No    Mobility  Bed Mobility Overal bed mobility: Needs Assistance Bed Mobility: Supine to Sit     Supine to sit: Supervision;HOB elevated          Transfers Overall transfer level: Needs assistance Equipment used: None Transfers: Sit to/from Stand Sit to Stand: Min assist         General transfer comment: MinA from lower  surface, offloaded to the left  Ambulation/Gait Ambulation/Gait assistance: Min guard Gait Distance (Feet): 200 Feet Assistive device: Straight cane Gait Pattern/deviations: Step-through pattern;Wide base of support;Drifts right/left Gait velocity: reduced   General Gait Details: suspect R sided inattention- no lateral instability but just does not seem to register obstacles in his way and needed cues to avoid. Steady and safe with SPC and able to ambulate without it at times   Stairs             Wheelchair Mobility    Modified Rankin (Stroke Patients Only)       Balance Overall balance assessment: Needs assistance Sitting-balance support: Feet supported Sitting balance-Leahy Scale: Good     Standing balance support: Single extremity supported Standing balance-Leahy Scale: Good Standing balance comment: does not require U UE support for dynamic balance- able to walk without cane                            Cognition Arousal/Alertness: Awake/alert Behavior During Therapy: WFL for tasks assessed/performed Overall Cognitive Status: Within Functional Limits for tasks assessed                                 General Comments: but impaired safety awareness/awareness of defcits- needed repeated cues to avoid obstacles on the right side. Suspect possible mild R inattention?      Exercises      General Comments General comments (skin integrity,  edema, etc.): per neuro notes, ok for permissive HTN for 5-7 days then return to normotensive levels      Pertinent Vitals/Pain Pain Assessment: No/denies pain    Home Living     Available Help at Discharge: Family Type of Home: House              Prior Function            PT Goals (current goals can now be found in the care plan section) Acute Rehab PT Goals Patient Stated Goal: to get back to work PT Goal Formulation: With patient Time For Goal Achievement: 04/20/21 Potential to  Achieve Goals: Good Progress towards PT goals: Progressing toward goals    Frequency    Min 4X/week      PT Plan Discharge plan needs to be updated    Co-evaluation              AM-PAC PT "6 Clicks" Mobility   Outcome Measure  Help needed turning from your back to your side while in a flat bed without using bedrails?: A Faria Help needed moving from lying on your back to sitting on the side of a flat bed without using bedrails?: A Popescu Help needed moving to and from a bed to a chair (including a wheelchair)?: A Bessire Help needed standing up from a chair using your arms (e.g., wheelchair or bedside chair)?: A Kovich Help needed to walk in hospital room?: A Heier Help needed climbing 3-5 steps with a railing? : A Bressi 6 Click Score: 18    End of Session Equipment Utilized During Treatment: Gait belt Activity Tolerance: Patient tolerated treatment well Patient left: in chair;with call bell/phone within reach Nurse Communication: Mobility status PT Visit Diagnosis: Unsteadiness on feet (R26.81);Muscle weakness (generalized) (M62.81);Difficulty in walking, not elsewhere classified (R26.2)     Time: 8721-5872 PT Time Calculation (min) (ACUTE ONLY): 18 min  Charges:  $Gait Training: 8-22 mins                    Windell Norfolk, DPT, PN2   Supplemental Physical Therapist [place]    Pager [phone] Acute Rehab Office [phone]

## 2021-04-07 NOTE — Evaluation (Signed)
Speech Language Pathology Evaluation Patient Details Name: Joshua Case MRN: [id number] DOB: 29-Apr-1961 Today's Date: 04/07/2021 Time: 1055-1110 SLP Time Calculation (min) (ACUTE ONLY): 15 min  Problem List:  Patient Active Problem List   Diagnosis Date Noted   Acute CVA (cerebrovascular accident) ([place]) 04/05/2021   Hypertensive emergency 04/05/2021   AKI (acute kidney injury) ([place]) 04/05/2021   Dyspnea on exertion 04/05/2021   Diabetes ([place]) 04/05/2021   HAV (hallux abducto valgus) 10/20/2014   Bunion 10/20/2014   Plantar porokeratosis, acquired 10/20/2014   Lumbar disc disease with radiculopathy 07/13/2013   DIAB W/RENAL MANIFESTS TYPE II/UNS TYPE UNCNTRL 10/15/2010   DYSLIPIDEMIA 10/15/2010   HYPERTENSION, ESSENTIAL, UNCONTROLLED 10/15/2010   MICROALBUMINURIA 10/15/2010   PERS HX NONCOMPLIANCE W/MED TX PRS HAZARDS HLTH 10/15/2010   Past Medical History:  Past Medical History:  Diagnosis Date   Diabetes mellitus without complication ([place])    Hypertension    Past Surgical History: History reviewed. No pertinent surgical history. HPI:  60 y.o. male admitted with Rt sided weakness, and Rt facial droop that started several days PTA.  MRI of brain showed Small focus of acute ischemia at the left cerebral peduncle; Old small vessel infarcts of the right caudate head and left lentiform nucleus.  PMH includes: diabetes mellitus type 2, hypertension, sciatica, lumbar spinal stenosis with neurogenic claudication, bilateral osteoarthritis of the hip, bilateral sacroiliac joint dysfunction.   Assessment / Plan / Recommendation Clinical Impression  Pt presents with grossly intact cognitive linguistic skills s/p CVA. Pt pleasant, oriented x4 with good recall of hospital events. [employer] Mental Status (SLUMS) administered 29/30 with only deficit in delayed recall 4/5 which is considered within functional limits. Pt fully intelligible with speech. He states during initial stroke  onset, family noted some mild slurring. He feels this has returned to baseline. Receptive and expressive language skills were intact. No skilled ST needs identified.    SLP Assessment  SLP Recommendation/Assessment: Patient does not need any further Speech Lanaguage Pathology Services SLP Visit Diagnosis: Dysarthria and anarthria (R47.1)    Follow Up Recommendations    N/a   Frequency and Duration           SLP Evaluation Cognition  Overall Cognitive Status: Within Functional Limits for tasks assessed Arousal/Alertness: Awake/alert Orientation Level: Oriented X4 Attention: Focused;Sustained Focused Attention: Appears intact Sustained Attention: Appears intact Memory: Appears intact Awareness: Appears intact Problem Solving: Appears intact Executive Function: Sequencing;Organizing Sequencing: Appears intact Organizing: Appears intact Safety/Judgment: Appears intact       Comprehension  Auditory Comprehension Overall Auditory Comprehension: Appears within functional limits for tasks assessed Reading Comprehension Reading Status: Within funtional limits    Expression Expression Primary Mode of Expression: Verbal Verbal Expression Overall Verbal Expression: Appears within functional limits for tasks assessed Written Expression Dominant Hand: Right   Oral / Motor  Oral Motor/Sensory Function Overall Oral Motor/Sensory Function: Within functional limits Motor Speech Overall Motor Speech: Appears within functional limits for tasks assessed (possible trace dysarthria of speech pt states feels at baseline) Respiration: Within functional limits Resonance: Within functional limits Articulation: Within functional limitis   GO          Joshua Case H. MA, [place]           04/07/2021, 11:41 AM

## 2021-04-08 ENCOUNTER — Encounter (HOSPITAL_COMMUNITY): Payer: Self-pay | Admitting: Internal Medicine

## 2021-04-08 LAB — GLUCOSE, CAPILLARY
Glucose-Capillary: 105 mg/dL — ABNORMAL HIGH (ref 70–99)
Glucose-Capillary: 111 mg/dL — ABNORMAL HIGH (ref 70–99)
Glucose-Capillary: 135 mg/dL — ABNORMAL HIGH (ref 70–99)
Glucose-Capillary: 147 mg/dL — ABNORMAL HIGH (ref 70–99)

## 2021-04-08 LAB — BASIC METABOLIC PANEL
Anion gap: 6 (ref 5–15)
BUN: 27 mg/dL — ABNORMAL HIGH (ref 6–20)
CO2: 20 mmol/L — ABNORMAL LOW (ref 22–32)
Calcium: 8.5 mg/dL — ABNORMAL LOW (ref 8.9–10.3)
Chloride: 109 mmol/L (ref 98–111)
Creatinine, Ser: 2.51 mg/dL — ABNORMAL HIGH (ref 0.61–1.24)
GFR, Estimated: 29 mL/min — ABNORMAL LOW (ref >60)
Glucose, Bld: 105 mg/dL — ABNORMAL HIGH (ref 70–99)
Potassium: 4.6 mmol/L (ref 3.5–5.1)
Sodium: 135 mmol/L (ref 135–145)

## 2021-04-08 MED ORDER — ASPIRIN EC 81 MG PO TBEC: 81.0000 mg | DELAYED_RELEASE_TABLET | Freq: Every day | ORAL | 3 refills | Status: AC

## 2021-04-08 MED ORDER — VALSARTAN 160 MG PO TABS: 160.0000 mg | ORAL_TABLET | Freq: Every day | ORAL | 11 refills | Status: DC

## 2021-04-08 MED ORDER — CLOPIDOGREL BISULFATE 75 MG PO TABS: 75.0000 mg | ORAL_TABLET | Freq: Every day | ORAL | 0 refills | Status: AC

## 2021-04-08 NOTE — TOC Initial Note (Signed)
Transition of Care [facility]) - Initial/Assessment Note    Patient Details  Name: Joshua Case MRN: [id number] Date of Birth: [birth year]  [facility] CM/SW Contact:    Marilu Favre, RN Phone Number: 04/08/2021, 10:50 AM  Clinical Narrative:                 Patient from home with wife.   Discussed PT/OT recommendations for OP PT and OP OT and tub bench. Patient in agreement.   Patient has transportation to Neuro Rehab. Order placed and information placed on AVS.   Discussed tub bench. Patient would like tub transfer bench, same ordered with Freda Munro with [place].  Expected Discharge Plan: Home/Self Care Barriers to Discharge: Continued Medical Work up   Patient Goals and CMS Choice Patient states their goals for this hospitalization and ongoing recovery are:: to return to home CMS Medicare.gov Compare Post Acute Care list provided to:: Patient Choice offered to / list presented to : Patient  Expected Discharge Plan and Services Expected Discharge Plan: Home/Self Care   Discharge Planning Services: CM Consult   Living arrangements for the past 2 months: Single Family Home                 DME Arranged: Tub bench (tub transfer bench) DME Agency: [employer] Date DME Agency Contacted: 04/08/21 Time DME Agency Contacted: [id number] Representative spoke with at DME Agency: [place]: NA          Prior Living Arrangements/Services Living arrangements for the past 2 months: [place] Lives with:: Spouse Patient language and need for interpreter reviewed:: Yes        Need for Family Participation in Patient Care: Yes (Comment) Care giver support system in place?: Yes (comment)   Criminal Activity/Legal Involvement Pertinent to Current Situation/Hospitalization: No - Comment as needed  Activities of Daily Living Home Assistive Devices/Equipment: Cane (specify quad or straight) ADL Screening (condition at time of admission) Patient's  cognitive ability adequate to safely complete daily activities?: Yes Is the patient deaf or have difficulty hearing?: No Does the patient have difficulty seeing, even when wearing glasses/contacts?: No Does the patient have difficulty concentrating, remembering, or making decisions?: No Patient able to express need for assistance with ADLs?: Yes Does the patient have difficulty dressing or bathing?: No Independently performs ADLs?: Yes (appropriate for developmental age) Does the patient have difficulty walking or climbing stairs?: Yes Weakness of Legs: Right Weakness of Arms/Hands: Right  Permission Sought/Granted   Permission granted to share information with : No              Emotional Assessment Appearance:: Appears stated age Attitude/Demeanor/Rapport: Engaged Affect (typically observed): Accepting Orientation: : Oriented to Self, Oriented to Place, Oriented to  Time, Oriented to Situation Alcohol / Substance Use: Not Applicable Psych Involvement: No (comment)  Admission diagnosis:  Acute CVA (cerebrovascular accident) [facility]) [I63.9] Cerebrovascular accident (CVA), unspecified mechanism ([place]) [I63.9] Patient Active Problem List   Diagnosis Date Noted   Acute CVA (cerebrovascular accident) ([place]) 04/05/2021   Hypertensive emergency 04/05/2021   AKI (acute kidney injury) ([place]) 04/05/2021   Dyspnea on exertion 04/05/2021   Diabetes ([place]) 04/05/2021   HAV (hallux abducto valgus) 10/20/2014   Bunion 10/20/2014   Plantar porokeratosis, acquired 10/20/2014   Lumbar disc disease with radiculopathy 07/13/2013   DIAB W/RENAL MANIFESTS TYPE II/UNS TYPE UNCNTRL 10/15/2010   DYSLIPIDEMIA 10/15/2010   HYPERTENSION, ESSENTIAL, UNCONTROLLED 10/15/2010   MICROALBUMINURIA 10/15/2010   PERS HX NONCOMPLIANCE W/MED  TX PRS HAZARDS HLTH 10/15/2010   PCP:  Pa, Vander:   [place] ([address] [place] [address] [place] ([place]) [place] [address] Phone: [phone] Fax: [phone]     Social Determinants of Health (SDOH) Interventions    Readmission Risk Interventions No flowsheet data found.

## 2021-04-08 NOTE — Progress Notes (Signed)
Pt IV removed, catheter intact. Tele removed, ccmd aware. Pt has all belongings and understands d/c instructions. Pt d/c via wheelchair by NT.  

## 2021-04-08 NOTE — Progress Notes (Signed)
Occupational Therapy Treatment Patient Details Name: Joshua Case MRN: [id number] DOB: 05-24-1961 Today's Date: 04/08/2021    History of present illness 60 y.o. male admitted with Rt sided weakness, and Rt facial droop that started several days PTA.  MRI of brain showed Small focus of acute ischemia at the left cerebral peduncle; Old small vessel infarcts of the right caudate head and left lentiform nucleus.  PMH includes: diabetes mellitus type 2, hypertension, sciatica, lumbar spinal stenosis with neurogenic claudication, bilateral osteoarthritis of the hip, bilateral sacroiliac joint dysfunction   OT comments  Pt making good progress with functional goals. Session focused on getting OOB, ambulating to bathroom, toilet and shower transfers, UB and LB bathing and dressing standing and general overall ADL mobility safety. Pt hopeful to d/c home later today  Follow Up Recommendations  Outpatient OT;Supervision - Intermittent    Equipment Recommendations  Shower chair   Recommendations for Other Services      Precautions / Restrictions Precautions Precautions: Fall Precaution Comments: R weakness UE>LE Restrictions Weight Bearing Restrictions: No       Mobility Bed Mobility Overal bed mobility: Needs Assistance Bed Mobility: Supine to Sit     Supine to sit: Supervision;HOB elevated          Transfers Overall transfer level: Needs assistance Equipment used: None Transfers: Sit to/from Stand Sit to Stand: Min assist;Min guard              Balance Overall balance assessment: Needs assistance Sitting-balance support: Feet supported Sitting balance-Leahy Scale: Good     Standing balance support: Single extremity supported Standing balance-Leahy Scale: Good                             ADL either performed or assessed with clinical judgement   ADL Overall ADL's : Needs assistance/impaired Eating/Feeding: Set up;Sitting   Grooming: Wash/dry  hands;Wash/dry face;Brushing hair;Min guard;Standing       Lower Body Bathing: Min guard;Sit to/from stand Lower Body Bathing Details (indicate cue type and reason): simulated Upper Body Dressing : Min guard;Standing   Lower Body Dressing: Min guard;Sit to/from stand   Toilet Transfer: Min guard;BSC;Ambulation;Grab bars;Cueing for Designer, television/film set and Hygiene: Min guard;Sit to/from stand   Tub/ Shower Transfer: Min guard;Ambulation;3 in 1;Grab bars;Cueing for safety   Functional mobility during ADLs: Min guard;Cueing for safety       Vision Patient Visual Report: No change from baseline     Perception     Praxis      Cognition Arousal/Alertness: Awake/alert Behavior During Therapy: WFL for tasks assessed/performed Overall Cognitive Status: Within Functional Limits for tasks assessed Area of Impairment: Awareness                               General Comments: but impaired safety awareness/awareness of defcits        Exercises     Shoulder Instructions       General Comments      Pertinent Vitals/ Pain       Pain Assessment: No/denies pain  Home Living                                          Prior Functioning/Environment  Frequency  Min 2X/week        Progress Toward Goals  OT Goals(current goals can now be found in the care plan section)  Progress towards OT goals: Progressing toward goals  Acute Rehab OT Goals Patient Stated Goal: to get back to work  Plan Discharge plan remains appropriate    Co-evaluation                 AM-PAC OT "6 Clicks" Daily Activity     Outcome Measure   Help from another person eating meals?: None Help from another person taking care of personal grooming?: A Daughety Help from another person toileting, which includes using toliet, bedpan, or urinal?: A Kruer Help from another person bathing (including washing, rinsing, drying)?: A  Remillard Help from another person to put on and taking off regular upper body clothing?: None Help from another person to put on and taking off regular lower body clothing?: A Mohamed 6 Click Score: 20    End of Session Equipment Utilized During Treatment: Gait belt;Other (comment) (3 in 1)  OT Visit Diagnosis: Unsteadiness on feet (R26.81);Ataxia, unspecified (R27.0)   Activity Tolerance Patient tolerated treatment well   Patient Left with call bell/phone within reach;in chair   Nurse Communication          Time: 8250-5397 OT Time Calculation (min): 26 min  Charges: OT General Charges $OT Visit: 1 Visit OT Treatments $Self Care/Home Management : 8-22 mins $Therapeutic Activity: 8-22 mins     Britt Bottom 04/08/2021, 1:40 PM

## 2021-04-08 NOTE — Discharge Summary (Addendum)
Physician Discharge Summary  Joshua Case YSA:[id number] DOB: [birth year] DOA: 04/05/2021  PCP: [facility]  Admit date: 04/05/2021 Discharge date: 04/08/2021  Time spent: 60 minutes  Recommendations for Outpatient Follow-up:  Follow-up neurology in 1 month Will need MRI brain pituitary protocol to rule out pituitary adenoma   Discharge Diagnoses:  Principal Problem:   Acute CVA (cerebrovascular accident) [facility]) Active Problems:   Hypertensive emergency   AKI (acute kidney injury) ([place])   Dyspnea on exertion   Diabetes [facility])   Discharge Condition: Stable  Diet recommendation: Heart healthy diet  Filed Weights   04/06/21 1610  Weight: (!) 138.6 kg    History of present illness:   60 year old male with a history of diabetes mellitus type 2, hypertension, sciatica, lumbar spinal stenosis with neurogenic claudication, bilateral osteoarthritis of the hip, bilateral sacroiliac joint dysfunction was brought to hospital after he developed right-sided weakness and right facial droop ast Tuesday, 04/02/2021.  In the ED blood pressure was found to be significantly elevated.  Head CT showed indeterminate small vessel infarct in the left basal ganglia/internal capsule.  Also showed chronic appearing small infarcts within the bilateral basal ganglia and right thalamic capsular junction.  MRI brain showed acute ischemia at the left side with bronchial.  Also showedtitrating concerning for pituitary adenoma.  Hospital Course:   CVA -Symptom began on 04/02/2021 at 11 AM -Out of tPA window -Neurology consulted; likely has small vessel ischemic stroke in the brainstem -Started on aspirin and Plavix; neurology recommends to continue with aspirin 81 and Plavix 75 mg DAPT for 3 weeks and then aspirin alone.  Continue Crestor 20 mg daily. -Echocardiogram is unremarkable for thrombus in the atrium;  MRA head and neck showed severe right A2, left P2 and right M1 stenosis -Neurology  has signed off; neurology to follow-up in 1 month as outpatient -Patient to go home with outpatient PT   Hypertensive urgency -Resolved -Started back on home regimen; no need for permissive hypertension as patient's symptoms started on 04/02/2021 -We will discontinue Diovan/HCTZ and start Diovan 160 mg daily -Continue amlodipine, metoprolol   Acute kidney injury -BUN is 34, creatinine 3.14; baseline creatinine was 1.5 as of April 2018. -Likely has developed CKD from hypertension and NSAID use at home -Was started on IV normal saline with improvement in creatinine up to 2.51. -Today today creatinine is 2.51.  Likely at baseline -We will discontinue  HCTZ/valsartan and Voltaren -He will need follow-up with nephrology as outpatient   Diabetes mellitus type 2 -We will hold metformin due to CKD stage III/IV;  GFR 29 mL/min   Dyspnea on exertion -O2 sats 100% on room air -Chest x-ray unremarkable, doubt he has pneumonia   Abnormal UA -UA shows 6-10 RBCs per hpf; 21-50 WBC per hpf -Patient is asymptomatic -Urine culture was never ordered; likely asymptomatic bacteriuria   Enlarged pituitary gland -MRI shows enlarged pituitary gland concerning for pituitary adenoma -He will need a dedicated pituitary protocol MRI with and without contrast for further evaluation as outpatient   Chronic pain syndrome -Continue gabapentin, as needed Tylenol.  Voltaren on hold due to AKI   Hyperlipidemia -Continue rosuvastatin  Procedures: Echocardiogram  Consultations: Neurology  Discharge Exam: Vitals:   04/08/21 0500 04/08/21 0720  BP: (!) 171/81 139/66  Pulse:  64  Resp:  19  Temp:  98 F (36.7 C)  SpO2:  99%    General: Appears in no acute distress Cardiovascular: S1-S2, regular Respiratory: Clear to auscultation bilaterally  Discharge Instructions   Discharge Instructions     Ambulatory referral to Neurology   Complete by: As directed    Follow up with stroke clinic NP  (Jessica Vanschaick or Cecille Rubin, if both not available, consider Zachery Dauer, or Ahern) at [facility] in about 4 weeks. Thanks.   Ambulatory referral to Occupational Therapy   Complete by: As directed    Ambulatory referral to Physical Therapy   Complete by: As directed    Diet - low sodium heart healthy   Complete by: As directed    Discharge instructions   Complete by: As directed    Do not take NSAIDs including Advil. Volteran, naproxen, Aleve as it can worsen your kidney function. You can take Tylenol as needed for pain.  Take Aspirin and Plavix together for 3 weeks, then stop taking Plavix and continue taking Aspirin.   Increase activity slowly   Complete by: As directed       Allergies as of 04/08/2021       Reactions   Victoza [liraglutide] Swelling        Medication List     STOP taking these medications    diclofenac 75 MG EC tablet Commonly known as: VOLTAREN   metFORMIN 1000 MG tablet Commonly known as: GLUCOPHAGE   valsartan-hydrochlorothiazide 160-25 MG tablet Commonly known as: DIOVAN-HCT       TAKE these medications    amLODipine 10 MG tablet Commonly known as: NORVASC Take 10 mg by mouth daily. What changed: Another medication with the same name was removed. Continue taking this medication, and follow the directions you see here.   aspirin EC 81 MG tablet Take 1 tablet (81 mg total) by mouth daily. Swallow whole.   clopidogrel 75 MG tablet Commonly known as: PLAVIX Take 1 tablet (75 mg total) by mouth daily for 21 days. Take Plavix along with aspirin for 21 days, then stop plavix and continue taking aspirin Start taking on: April 09, 2021   gabapentin 300 MG capsule Commonly known as: NEURONTIN Take 1 capsule (300 mg total) by mouth at bedtime.   glimepiride 4 MG tablet Commonly known as: AMARYL Take 4 mg by mouth daily.   insulin detemir 100 UNIT/ML injection Commonly known as: LEVEMIR Inject 20 Units into the skin at bedtime.    Insulin Pen Needle 31G X 5 MM Misc Commonly known as: B-D UF III MINI PEN NEEDLES 1 each by Does not apply route daily.   metoprolol succinate 100 MG 24 hr tablet Commonly known as: TOPROL-XL Take 100 mg by mouth 2 (two) times daily.   oxyCODONE-acetaminophen 5-325 MG tablet Commonly known as: Percocet Take 2 tablets by mouth every 6 (six) hours as needed for severe pain.   rosuvastatin 20 MG tablet Commonly known as: CRESTOR Take 20 mg by mouth daily.   valsartan 160 MG tablet Commonly known as: Diovan Take 1 tablet (160 mg total) by mouth daily.               Durable Medical Equipment  (From admission, onward)           Start     Ordered   04/08/21 1048  For home use only DME Tub bench  Once       Comments: Tub transfer bench   04/08/21 1047           Allergies  Allergen Reactions   Victoza [Liraglutide] Swelling    Follow-up Information     [facility]. Schedule an  appointment as soon as possible for a visit in 1 month(s).   Specialty: Neurology Why: stroke clinic Contact information: [address] [place]. Schedule an appointment as soon as possible for a visit.   Specialty: Rehabilitation Contact information: [address] [place] [id number] [place] [phone]                 The results of significant diagnostics from this hospitalization (including imaging, microbiology, ancillary and laboratory) are listed below for reference.    Significant Diagnostic Studies: CT HEAD WO CONTRAST (5MM)  Result Date: 04/05/2021 CLINICAL DATA:  Neuro deficit, acute, stroke suspected. Additional history provided: Right-sided weakness, right facial droop since Tuesday. History of diabetes and hypertension. EXAM: CT HEAD WITHOUT CONTRAST TECHNIQUE: Contiguous axial images were obtained  from the base of the skull through the vertex without intravenous contrast. COMPARISON:  No pertinent prior exams available for comparison. FINDINGS: Brain: Cerebral volume is normal. Chronic small-vessel infarcts within the right basal ganglia and right thalamocapsular junction. There are two small vessel infarcts within the left basal ganglia/internal capsule, one of which appears chronic. The other has an age-indeterminate appearance, and may be acute/early subacute given the provided history. Background mild patchy and ill-defined hypoattenuation within the cerebral white matter, nonspecific but compatible with chronic small vessel ischemic disease. There is no acute intracranial hemorrhage. No demarcated cortical infarct. No extra-axial fluid collection. No evidence of an intracranial mass. No midline shift. Vascular: No hyperdense vessel.  Atherosclerotic calcifications. Skull: Normal. Negative for fracture or focal lesion. Sinuses/Orbits: Visualized orbits show no acute finding. Trace bilateral ethmoid sinus mucosal thickening. Small volume fluid within a posterior left ethmoid air cell. IMPRESSION: Age-indeterminate small vessel infarct within the left basal ganglia/internal capsule. This may be acute/early subacute given the provided history. Consider a brain MRI for further evaluation. Chronic appearing small vessel infarcts within the bilateral basal ganglia and right thalamocapsular junction. Background mild chronic small vessel ischemic changes within the cerebral white matter. Mild paranasal sinus disease at the imaged levels, as described. Electronically Signed   By: Kellie Simmering DO   On: 04/05/2021 15:51   MR ANGIO HEAD WO CONTRAST  Result Date: 04/06/2021 CLINICAL DATA:  Neuro deficit, acute, stroke suspected; Stroke, follow up EXAM: MRA NECK WITHOUT CONTRAST MRA HEAD WITHOUT CONTRAST TECHNIQUE: Multiplanar and multiecho pulse sequences of the neck were obtained without intravenous contrast.  Angiographic images of the neck were obtained using MRA technique without intravenous contrast; Angiographic images of the Circle of Willis were obtained using MRA technique without intravenous contrast. COMPARISON:  MRI head 04/05/2021.  No vascular imaging comparison. FINDINGS: MRA NECK FINDINGS Aortic arch: Poorly visualized with great vessel origins appearing patent. Nondiagnostic evaluation for stenosis. Right carotid system: Nondiagnostic evaluation proximally due to extensive artifact patent mid and upper common carotid artery and internal carotid artery without evidence of greater than 50% stenosis. Left carotid system: Nondiagnostic evaluation proximally due to extensive artifact. Patent mid to upper common carotid artery and internal carotid artery without evidence of greater than 50% stenosis. Vertebral arteries: Nondiagnostic evaluation proximally due to extensive artifact. The mid upper vertebral arteries are patent without evidence of greater than 50% stenosis. Mildly right dominant. MRA HEAD FINDINGS Anterior circulation: Hypoplastic or absent left A1 ACA, likely congenital given prominent right A1 ACA. Bilateral intracranial ICAs, MCAs, and ACAs are patent. Moderate right M1 MCA stenosis. Mild right proximal  M2 MCA stenosis. Inferior M2 MCA branches not well evaluated due to technique. Moderate to severe right A2 ACA stenosis. Approximately 2 mm outpouching rising from the right A2 ACA laterally (see series 5, image 125). Approximately 2 mm outpouching arising from the right paraclinoid ICA (series 5, image 79). Additional 1-2 mm outpouching arising from the more distal right paraclinoid ICA (series 5, image 84). Approximately 1-2 mm outpouching arising from a proximal right M2 MCA branch (series 1034, image 279; series 5, image 92). Posterior circulation: Bilateral intradural vertebral arteries, basilar artery, and posterior cerebral arteries are patent. Visualized bilateral PICAs are patent.  Moderate stenosis of the proximal left M2 MCA. No aneurysm identified. IMPRESSION: MRA Head: 1. No large vessel occlusion. 2. Moderate to severe right A2 ACA stenosis. 3. Moderate left proximal P2 PCA and right M1 MCA stenosis. 4. Mild right M2 MCA stenosis. 5. Approximately 2 mm and additional 1-2 mm outpouchings arising from the right paraclinoid ICA, 2 mm outpouching arising from the right A2 ACA, and 1-2 mm outpouching arising from a proximal right M2 MCA branch, compatible with aneurysms versus infundibula with vessels not clearly visible by MRA. A CTA could further characterize if clinically indicated. MRA Neck: Nondiagnostic evaluation of the origins/proximal vessels in the lower neck due to artifact. Outside of this area, no evidence of significant (greater than 50%) stenosis in the neck. Electronically Signed   By: Margaretha Sheffield MD   On: 04/06/2021 09:56   MR ANGIO NECK WO CONTRAST  Result Date: 04/06/2021 CLINICAL DATA:  Neuro deficit, acute, stroke suspected; Stroke, follow up EXAM: MRA NECK WITHOUT CONTRAST MRA HEAD WITHOUT CONTRAST TECHNIQUE: Multiplanar and multiecho pulse sequences of the neck were obtained without intravenous contrast. Angiographic images of the neck were obtained using MRA technique without intravenous contrast; Angiographic images of the Circle of Willis were obtained using MRA technique without intravenous contrast. COMPARISON:  MRI head 04/05/2021.  No vascular imaging comparison. FINDINGS: MRA NECK FINDINGS Aortic arch: Poorly visualized with great vessel origins appearing patent. Nondiagnostic evaluation for stenosis. Right carotid system: Nondiagnostic evaluation proximally due to extensive artifact patent mid and upper common carotid artery and internal carotid artery without evidence of greater than 50% stenosis. Left carotid system: Nondiagnostic evaluation proximally due to extensive artifact. Patent mid to upper common carotid artery and internal carotid artery  without evidence of greater than 50% stenosis. Vertebral arteries: Nondiagnostic evaluation proximally due to extensive artifact. The mid upper vertebral arteries are patent without evidence of greater than 50% stenosis. Mildly right dominant. MRA HEAD FINDINGS Anterior circulation: Hypoplastic or absent left A1 ACA, likely congenital given prominent right A1 ACA. Bilateral intracranial ICAs, MCAs, and ACAs are patent. Moderate right M1 MCA stenosis. Mild right proximal M2 MCA stenosis. Inferior M2 MCA branches not well evaluated due to technique. Moderate to severe right A2 ACA stenosis. Approximately 2 mm outpouching rising from the right A2 ACA laterally (see series 5, image 125). Approximately 2 mm outpouching arising from the right paraclinoid ICA (series 5, image 79). Additional 1-2 mm outpouching arising from the more distal right paraclinoid ICA (series 5, image 84). Approximately 1-2 mm outpouching arising from a proximal right M2 MCA branch (series 1034, image 279; series 5, image 92). Posterior circulation: Bilateral intradural vertebral arteries, basilar artery, and posterior cerebral arteries are patent. Visualized bilateral PICAs are patent. Moderate stenosis of the proximal left M2 MCA. No aneurysm identified. IMPRESSION: MRA Head: 1. No large vessel occlusion. 2. Moderate to severe right A2 ACA stenosis.  3. Moderate left proximal P2 PCA and right M1 MCA stenosis. 4. Mild right M2 MCA stenosis. 5. Approximately 2 mm and additional 1-2 mm outpouchings arising from the right paraclinoid ICA, 2 mm outpouching arising from the right A2 ACA, and 1-2 mm outpouching arising from a proximal right M2 MCA branch, compatible with aneurysms versus infundibula with vessels not clearly visible by MRA. A CTA could further characterize if clinically indicated. MRA Neck: Nondiagnostic evaluation of the origins/proximal vessels in the lower neck due to artifact. Outside of this area, no evidence of significant (greater  than 50%) stenosis in the neck. Electronically Signed   By: Margaretha Sheffield MD   On: 04/06/2021 09:56   MR BRAIN WO CONTRAST  Result Date: 04/05/2021 CLINICAL DATA:  Right-sided weakness EXAM: MRI HEAD WITHOUT CONTRAST TECHNIQUE: Multiplanar, multiecho pulse sequences of the brain and surrounding structures were obtained without intravenous contrast. COMPARISON:  None. FINDINGS: Brain: Small focus of abnormal diffusion restriction at the left cerebral peduncle. No other diffusion abnormality. Chronic blood products noted at both basal ganglia. Old small vessel infarcts of the right caudate head and left lentiform nucleus. There is multifocal hyperintense T2-weighted signal within the white matter. Generalized volume loss without a clear lobar predilection. Pituitary gland appears enlarged with craniocaudal diameter of 9 mm. Vascular: Major flow voids are preserved. Skull and upper cervical spine: Normal calvarium and skull base. Visualized upper cervical spine and soft tissues are normal. Sinuses/Orbits:No paranasal sinus fluid levels or advanced mucosal thickening. No mastoid or middle ear effusion. Normal orbits. IMPRESSION: 1. Small focus of acute ischemia at the left cerebral peduncle. No hemorrhage or mass effect. 2. Old small vessel infarcts of the right caudate head and left lentiform nucleus. 3. Pituitary gland appears enlarged with craniocaudal diameter of 9 mm. This may be a pituitary adenoma. Dedicated MRI of the pituitary gland with and without contrast is recommended for further characterization and can be performed on an outpatient basis. Electronically Signed   By: Ulyses Jarred M.D.   On: 04/05/2021 20:44   DG Chest Port 1V same Day  Result Date: 04/07/2021 CLINICAL DATA:  Stroke.  Dyspnea. EXAM: PORTABLE CHEST 1 VIEW COMPARISON:  None FINDINGS: Heart size is accentuated by technique. Subsegmental atelectasis or early infiltrate in the RIGHT lung base. No pulmonary edema. IMPRESSION: RIGHT  LOWER lobe atelectasis or early infiltrate. Electronically Signed   By: Nolon Nations M.D.   On: 04/07/2021 17:47   ECHOCARDIOGRAM COMPLETE  Result Date: 04/07/2021    ECHOCARDIOGRAM REPORT   Patient Name:   Joshua Case [place]ill Date of Exam: 04/07/2021 Medical Rec #:  [id number]      Height:       75.0 in Accession #:    [id number]     Weight:       305.6 lb Date of Birth:  1960-09-28     BSA:          2.627 m Patient Age:    46 years       BP:           166/106 mmHg Patient Gender: M              HR:           63 bpm. Exam Location:  Inpatient Procedure: 2D Echo, Cardiac Doppler and Color Doppler  MODIFIED REPORT:  This report was modified by Cherlynn Kaiser MD on 04/07/2021 due to conclusion                                     added.  Indications:     Stroke I 63.9  History:         Patient has no prior history of Echocardiogram examinations.                  Risk Factors:Morbid obesity.  Sonographer:     Merrie Roof RDCS Referring Phys:  [id number] Shela Leff Diagnosing Phys: Cherlynn Kaiser MD IMPRESSIONS  1. Left ventricular ejection fraction, by estimation, is 60 to 65%. The left ventricle has normal function. The left ventricle has no regional wall motion abnormalities. There is moderate left ventricular hypertrophy. Left ventricular diastolic parameters are consistent with Grade II diastolic dysfunction (pseudonormalization).  2. Right ventricular systolic function is normal. The right ventricular size is normal. Tricuspid regurgitation signal is inadequate for assessing PA pressure.  3. Left atrial size was moderately dilated.  4. Right atrial size was mildly dilated.  5. The mitral valve is normal in structure. Trivial mitral valve regurgitation. No evidence of mitral stenosis.  6. The aortic valve is grossly normal. Aortic valve regurgitation is trivial. No aortic stenosis is present.  7. The inferior vena cava is normal in size with greater than 50% respiratory variability,  suggesting right atrial pressure of 3 mmHg. Conclusion(s)/Recommendation(s): No intracardiac source of embolism detected on this transthoracic study. A transesophageal echocardiogram is recommended to exclude cardiac source of embolism if clinically indicated. FINDINGS  Left Ventricle: Left ventricular ejection fraction, by estimation, is 60 to 65%. The left ventricle has normal function. The left ventricle has no regional wall motion abnormalities. 3D left ventricular ejection fraction analysis performed but not reported based on interpreter judgement due to suboptimal quality. The left ventricular internal cavity size was normal in size. There is moderate left ventricular hypertrophy. Left ventricular diastolic parameters are consistent with Grade II diastolic dysfunction (pseudonormalization). Right Ventricle: The right ventricular size is normal. No increase in right ventricular wall thickness. Right ventricular systolic function is normal. Tricuspid regurgitation signal is inadequate for assessing PA pressure. Left Atrium: Left atrial size was moderately dilated. Right Atrium: Right atrial size was mildly dilated. Pericardium: There is no evidence of pericardial effusion. Mitral Valve: The mitral valve is normal in structure. Trivial mitral valve regurgitation. No evidence of mitral valve stenosis. Tricuspid Valve: The tricuspid valve is normal in structure. Tricuspid valve regurgitation is trivial. No evidence of tricuspid stenosis. Aortic Valve: The aortic valve is grossly normal. Aortic valve regurgitation is trivial. No aortic stenosis is present. Aortic valve mean gradient measures 4.0 mmHg. Aortic valve peak gradient measures 7.1 mmHg. Aortic valve area, by VTI measures 3.27 cm. Pulmonic Valve: The pulmonic valve was not well visualized. Pulmonic valve regurgitation is trivial. No evidence of pulmonic stenosis. Aorta: The aortic root is normal in size and structure. Venous: The inferior vena cava is  normal in size with greater than 50% respiratory variability, suggesting right atrial pressure of 3 mmHg. IAS/Shunts: No atrial level shunt detected by color flow Doppler.  LEFT VENTRICLE PLAX 2D LVIDd:         4.90 cm      Diastology LVIDs:         3.40 cm      LV e' medial:  4.24 cm/s LV PW:         1.40 cm      LV E/e' medial:  19.5 LV IVS:        1.40 cm      LV e' lateral:   4.24 cm/s LVOT diam:     2.50 cm      LV E/e' lateral: 19.5 LV SV:         96 LV SV Index:   37 LVOT Area:     4.91 cm                              3D Volume EF: LV Volumes (MOD)            3D EF:        54 % LV vol d, MOD A4C: 135.0 ml LV EDV:       309 ml LV SV MOD A4C:     135.0 ml LV ESV:       142 ml                             LV SV:        167 ml RIGHT VENTRICLE RV Basal diam:  4.00 cm LEFT ATRIUM          Index      RIGHT ATRIUM          Index LA diam:    4.80 cm  1.83 cm/m RA Area:       26.30 LA Vol      142.0 ml 54.04                     cm (A2C):               ml/m      RA Volume:     85.90  32.69 LA Vol      128.0 ml 48.72                     ml     ml/m (A4C):               ml/m LA Biplane  146.0 ml 55.57 Vol:                 ml/m                      overestima                      ted  AORTIC VALVE AV Area (Vmax):    3.48 cm AV Area (Vmean):   3.10 cm AV Area (VTI):     3.27 cm AV Vmax:           133.00 cm/s AV Vmean:          92.300 cm/s AV VTI:            0.294 m AV Peak Grad:      7.1 mmHg AV Mean Grad:      4.0 mmHg LVOT Vmax:         94.30 cm/s LVOT Vmean:        58.300 cm/s LVOT VTI:          0.196 m LVOT/AV VTI ratio: 0.67  AORTA Ao Root diam: 3.70 cm Ao Asc diam:  3.40  cm MITRAL VALVE MV Area (PHT): 3.54 cm    SHUNTS MV Decel Time: 214 msec    Systemic VTI:  0.20 m MV E velocity: 82.70 cm/s  Systemic Diam: 2.50 cm MV A velocity: 75.80 cm/s MV E/A ratio:  1.09 Cherlynn Kaiser MD Electronically signed by Cherlynn Kaiser MD Signature Date/Time: 04/07/2021/5:22:14 PM    Final (Updated)      Microbiology: Recent Results (from the past 240 hour(s))  Resp Panel by RT-PCR (Flu A&B, Covid) Nasopharyngeal Swab     Status: None   Collection Time: 04/05/21  9:31 PM   Specimen: Nasopharyngeal Swab; Nasopharyngeal(NP) swabs in vial transport medium  Result Value Ref Range Status   SARS Coronavirus 2 by RT PCR NEGATIVE NEGATIVE Final    Comment: (NOTE) SARS-CoV-2 target nucleic acids are NOT DETECTED.  The SARS-CoV-2 RNA is generally detectable in upper respiratory specimens during the acute phase of infection. The lowest concentration of SARS-CoV-2 viral copies this assay can detect is 138 copies/mL. A negative result does not preclude SARS-Cov-2 infection and should not be used as the sole basis for treatment or other patient management decisions. A negative result may occur with  improper specimen collection/handling, submission of specimen other than nasopharyngeal swab, presence of viral mutation(s) within the areas targeted by this assay, and inadequate number of viral copies(<138 copies/mL). A negative result must be combined with clinical observations, patient history, and epidemiological information. The expected result is Negative.  Fact Sheet for Patients:  EntrepreneurPulse.com.au  Fact Sheet for Healthcare Providers:  IncredibleEmployment.be  This test is no t yet approved or cleared by the [place] FDA and  has been authorized for detection and/or diagnosis of SARS-CoV-2 by FDA under an Emergency Use Authorization (EUA). This EUA will remain  in effect (meaning this test can be used) for the duration of the COVID-19 declaration under Section 564(b)(1) of the Act, 21 U.S.C.section 360bbb-3(b)(1), unless the authorization is terminated  or revoked sooner.       Influenza A by PCR NEGATIVE NEGATIVE Final   Influenza B by PCR NEGATIVE NEGATIVE Final    Comment: (NOTE) The Xpert Xpress SARS-CoV-2/FLU/RSV plus assay is  intended as an aid in the diagnosis of influenza from Nasopharyngeal swab specimens and should not be used as a sole basis for treatment. Nasal washings and aspirates are unacceptable for Xpert Xpress SARS-CoV-2/FLU/RSV testing.  Fact Sheet for Patients: EntrepreneurPulse.com.au  Fact Sheet for Healthcare Providers: IncredibleEmployment.be  This test is not yet approved or cleared by the [place] FDA and has been authorized for detection and/or diagnosis of SARS-CoV-2 by FDA under an Emergency Use Authorization (EUA). This EUA will remain in effect (meaning this test can be used) for the duration of the COVID-19 declaration under Section 564(b)(1) of the Act, 21 U.S.C. section 360bbb-3(b)(1), unless the authorization is terminated or revoked.  Performed at [facility], [place] [address] [place], [place] [address]      Labs: Basic Metabolic Panel: Recent Labs  Lab 04/05/21 1451 04/07/21 0021 04/08/21 0420  NA 140 136 135  K 4.5 4.7 4.6  CL 111 109 109  CO2 21* 22 20*  GLUCOSE 61* 89 105*  BUN 34* 29* 27*  CREATININE 3.14* 2.52* 2.51*  CALCIUM 8.6* 8.5* 8.5*   Liver Function Tests: Recent Labs  Lab 04/05/21 1451  AST 16  ALT 15  ALKPHOS 66  BILITOT 0.7  PROT 6.6  ALBUMIN 3.0*   No results for input(s): LIPASE, AMYLASE in the last  168 hours. No results for input(s): AMMONIA in the last 168 hours. CBC: Recent Labs  Lab 04/05/21 1451 04/07/21 0021  WBC 7.1 6.9  NEUTROABS 3.9  --   HGB 11.9* 11.3*  HCT 38.1* 35.9*  MCV 87.8 87.8  PLT 289 263   CBG: Recent Labs  Lab 04/07/21 2006 04/07/21 2355 04/08/21 0345 04/08/21 0719 04/08/21 1055  GLUCAP 150* 111* 105* 147* 135*       Signed:  Oswald Hillock MD.  [facility] 04/08/2021, 11:09 AM

## 2021-04-10 ENCOUNTER — Other Ambulatory Visit: Payer: Self-pay

## 2021-04-10 ENCOUNTER — Ambulatory Visit: Payer: Federal, State, Local not specified - PPO | Attending: Family Medicine

## 2021-04-10 VITALS — BP 145/76 | HR 60

## 2021-04-10 DIAGNOSIS — M25511 Pain in right shoulder: Secondary | ICD-10-CM | POA: Insufficient documentation

## 2021-04-10 DIAGNOSIS — R2681 Unsteadiness on feet: Secondary | ICD-10-CM | POA: Diagnosis present

## 2021-04-10 DIAGNOSIS — R2689 Other abnormalities of gait and mobility: Secondary | ICD-10-CM | POA: Diagnosis present

## 2021-04-10 DIAGNOSIS — G8929 Other chronic pain: Secondary | ICD-10-CM | POA: Diagnosis present

## 2021-04-10 DIAGNOSIS — M6281 Muscle weakness (generalized): Secondary | ICD-10-CM | POA: Insufficient documentation

## 2021-04-10 NOTE — Therapy (Signed)
[facility] [address] [place], [place], [address] Phone: [phone]   Fax:  [phone]  Physical Therapy Evaluation  Patient Details  Name: Joshua Case MRN: [id number] Date of Birth: [birth year] Referring Provider (PT): Oswald Hillock, MD   Encounter Date: 04/10/2021   PT End of Session - 04/10/21 0932     Visit Number 1    Number of Visits 13    Date for PT Re-Evaluation 05/31/21   POC for 6 weeks + eval   Authorization Type BCBS/Federal PPO (VL; 50 PT/ST/OT)    PT Start Time 0930    PT Stop Time 1010    PT Time Calculation (min) 40 min    Equipment Utilized During Treatment Gait belt    Activity Tolerance Patient tolerated treatment well    Behavior During Therapy WFL for tasks assessed/performed             Past Medical History:  Diagnosis Date   Diabetes mellitus without complication ([place])    Hypertension     History reviewed. No pertinent surgical history.  Vitals:   04/10/21 0948  BP: (!) 145/76  Pulse: 60      Subjective Assessment - 04/10/21 0935     Subjective Patietn reports had stroke on 04/05/21, and was recently discharged home. Reports some residual weakness on the R Side. Denies falls. Is currently using a SPC, indoors and outdoors. Would like regain strength as much as possible. Was not using a SPC prior to CVA.    Pertinent History diabetes mellitus type 2, hypertension, sciatica, lumbar spinal stenosis with neurogenic claudication, bilateral osteoarthritis of the hip, bilateral sacroiliac joint dysfunctio    Limitations Lifting;Walking;Standing;House hold activities    How long can you walk comfortably? 20 minutes    Diagnostic tests MRI of brain showed Small focus of acute ischemia at the left cerebral peduncle; Old small vessel infarcts of the right caudate head and left lentiform nucleus    Patient Stated Goals regain his strength    Currently in Pain? No/denies                 [facility] PT Assessment - 04/10/21 0001       Assessment   Medical Diagnosis CVA    Referring Provider (PT) Oswald Hillock, MD    Onset Date/Surgical Date 04/05/21    Hand Dominance Right      Precautions   Precautions Fall      Restrictions   Weight Bearing Restrictions No      Balance Screen   Has the patient fallen in the past 6 months No    Has the patient had a decrease in activity level because of a fear of falling?  No    Is the patient reluctant to leave their home because of a fear of falling?  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; Engineer, materials   Overall Cognitive Status Within Functional Limits for  tasks assessed      Observation/Other Assessments   Focus on Therapeutic Outcomes (FOTO)  Stroke LE: 58%      Observation/Other Assessments-Edema    Edema --   None Noted on BLE     Sensation   Light Touch Appears Intact    Additional Comments for BLE's      Coordination   Gross Motor Movements are Fluid and Coordinated No    Coordination and Movement Description grossly uncoordinated on RLE noted due to general weakness      ROM / Strength   AROM / PROM / Strength Strength;AROM      AROM   Overall AROM  Within functional limits for tasks performed      Strength   Overall Strength Deficits    Strength Assessment Site Hip;Knee;Ankle    Right/Left Hip Right;Left    Right Hip Flexion 3+/5    Right Hip ABduction 3+/5    Right Hip ADduction 3+/5    Left Hip Flexion 4+/5    Left Hip ABduction 4/5    Left Hip ADduction 4/5    Right/Left  Knee Right;Left    Right Knee Flexion 4-/5    Right Knee Extension 4-/5    Left Knee Flexion 4+/5    Left Knee Extension 4+/5    Right/Left Ankle Right;Left    Right Ankle Dorsiflexion 4/5    Left Ankle Dorsiflexion 4+/5      Transfers   Transfers Sit to Stand;Stand to Sit    Sit to Stand 5: Supervision;4: Min guard    Sit to Stand Details Verbal cues for precautions/safety    Five time sit to stand comments  31.09 secs with BUE support; decreased control with descent requiring CGA    Stand to Sit 5: Supervision;4: Min guard    Stand to Sit Details (indicate cue type and reason) Verbal cues for precautions/safety;Verbal cues for technique      Ambulation/Gait   Ambulation/Gait Yes    Ambulation/Gait Assistance 5: Supervision;4: Min guard    Ambulation/Gait Assistance Details completed ambulation with SPC, despite R side weakness due to CVA, Patient reports feeling more comfortable with cane placed on R side.    Ambulation Distance (Feet) 75 Feet    Assistive device Straight cane    Gait Pattern Step-through pattern;Decreased arm swing - right;Decreased step length - left;Decreased stance time - right    Ambulation Surface Level;Indoor    Gait velocity 16.91 seconds = 1.94 ft/sec      Standardized Balance Assessment   Standardized Balance Assessment Timed Up and Go Test;Berg Balance Test      Berg Balance Test   Sit to Stand Able to stand  independently using hands    Standing Unsupported Able to stand 2 minutes with supervision    Sitting with Back Unsupported but Feet Supported on Floor or Stool Able to sit safely and securely 2 minutes    Stand to Sit Uses backs of legs against chair to control descent    Transfers Able to transfer safely, definite need of hands    Standing Unsupported with Eyes Closed Able to stand 10 seconds safely    Standing Unsupported with Feet Together Able to place feet together independently and stand for 1 minute with supervision    From Standing,  Reach Forward with Outstretched Arm Can reach forward >5 cm safely (2")    From Standing Position, Pick up Object from Floor Able to pick up shoe, needs supervision    From  Standing Position, Turn to Look Behind Over each Shoulder Turn sideways only but maintains balance    Turn 360 Degrees Able to turn 360 degrees safely but slowly    Standing Unsupported, Alternately Place Feet on Step/Stool Able to complete 4 steps without aid or supervision    Standing Unsupported, One Foot in Front Able to take small step independently and hold 30 seconds    Standing on One Leg Tries to lift leg/unable to hold 3 seconds but remains standing independently    Total Score 36    Berg comment: 36/56      Timed Up and Go Test   TUG Normal TUG    Normal TUG (seconds) 13.28   with SPC                       Objective measurements completed on examination: See above findings.               PT Education - 04/10/21 0934     Education Details Educated on [employer]; Continued use of SPC for safety.    Person(s) Educated Patient    Methods Explanation    Comprehension Verbalized understanding              PT Short Term Goals - 04/10/21 1031       PT SHORT TERM GOAL #1   Title Patient will be independent with Final HEP for strength/balance (ALL STGs Due: 05/10/2021)    Baseline no HEP established    Time 3    Period Weeks    Status New    Target Date 05/10/21      PT SHORT TERM GOAL #2   Title Patient will improve 5x sit <> stand to </= 25 seconds with BUE support    Baseline 31.09 secs    Time 3    Period Weeks    Status New      PT SHORT TERM GOAL #3   Title Patient will improve Berg Balance to >/= 40/56 to demo improved balance    Baseline 36/56    Time 3    Period Weeks    Status New               PT Long Term Goals - 04/10/21 1033       PT LONG TERM GOAL #1   Title Patient will be independent with final HEP for strength/balance and  daily walking program (ALL LTGs Due: 05/31/21)    Baseline no HEP established    Time 6    Period Weeks    Status New    Target Date 05/31/21      PT LONG TERM GOAL #2   Title Patient will improve Berg Balance to >/= 45/56 to demo reduced fall risk and improved balance    Baseline 36/56    Time 6    Period Weeks    Status New      PT LONG TERM GOAL #3   Title Patient will improve TUG to </= 12 secs with LRAD to demo reduced fall risk    Baseline 13.28 secs    Time 6    Period Weeks    Status New      PT LONG TERM GOAL #4   Title Patient will improve gait speed to >/= 2.5 ft/sec with LRAD to demo improved community mobility    Baseline 1.94 ft/sec    Time 6    Period Weeks  Status New      PT LONG TERM GOAL #5   Title Patient will improve 5x sit <> stand to </= 20 seconds to demo improved balance    Baseline 31.09 secs    Time 6    Period Weeks    Status New      Additional Long Term Goals   Additional Long Term Goals Yes      PT LONG TERM GOAL #6   Title Patient will improve FOTO to >/= 70%    Baseline 58%    Time 6    Period Weeks    Status New                    Plan - 04/10/21 1027     Clinical Impression Statement Patient is a 60 y.o. male referred to Neuro OPPT services for CVA of L Cerebral Peduncle. Patient was discharged home on 04/08/21. Patient's PMH significant for the following: diabetes mellitus type 2, hypertension, sciatica, lumbar spinal stenosis with neurogenic claudication, bilateral osteoarthritis of the hip, bilateral sacroiliac joint dysfunction. Paitent is currently ambulating with SPC at 1.94 ft/second indicating limited community ambulator. Patient is at increased risk for falls with [facility] of 36/56, TUG of 13.28 secs, and 5x sit <> stand of 31.09 seconds. Patient presents with the following impairments: decreased strength, impaired balance, decreased endurance/activity tolerance, abnormal gait, and increased fall risk. Patient  will benefit from skilled PT services to address impairments.    Personal Factors and Comorbidities Comorbidity 3+    Comorbidities diabetes mellitus type 2, hypertension, sciatica, lumbar spinal stenosis with neurogenic claudication, bilateral osteoarthritis of the hip, bilateral sacroiliac joint dysfunction    Examination-Activity Limitations Stairs;Squat;Locomotion Level;Stand    Examination-Participation Restrictions Occupation;Yard Work;Community Activity;Driving    Stability/Clinical Decision Making Stable/Uncomplicated    Clinical Decision Making Low    Rehab Potential Good    PT Frequency 2x / week    PT Duration 6 weeks   plus eval   PT Treatment/Interventions ADLs/Self Care Home Management;Aquatic Therapy;Canalith Repostioning;Moist Heat;DME Instruction;Electrical Stimulation;Cryotherapy;Therapeutic activities;Functional mobility training;Stair training;Gait training;Therapeutic exercise;Balance training;Neuromuscular re-education;Patient/family education;Orthotic Fit/Training;Manual techniques;Passive range of motion;Dry needling;Vestibular;Joint Manipulations    PT Next Visit Plan Check BP. Initiate HEP focused on Strengthening/Balance. Training with [facility] for proper sequencing.    Recommended Other Services Occupational Therapy    Consulted and Agree with Plan of Care Patient             Patient will benefit from skilled therapeutic intervention in order to improve the following deficits and impairments:  Abnormal gait, Decreased balance, Decreased endurance, Decreased activity tolerance, Decreased coordination, Decreased strength, Decreased knowledge of use of DME, Pain, Difficulty walking  Visit Diagnosis: Muscle weakness (generalized)  Other abnormalities of gait and mobility  Unsteadiness on feet     Problem List Patient Active Problem List   Diagnosis Date Noted   Acute CVA (cerebrovascular accident) ([place]) 04/05/2021   Hypertensive emergency 04/05/2021   AKI  (acute kidney injury) ([place]) 04/05/2021   Dyspnea on exertion 04/05/2021   Diabetes ([place]) 04/05/2021   HAV (hallux abducto valgus) 10/20/2014   Bunion 10/20/2014   Plantar porokeratosis, acquired 10/20/2014   Lumbar disc disease with radiculopathy 07/13/2013   DIAB W/RENAL MANIFESTS TYPE II/UNS TYPE UNCNTRL 10/15/2010   DYSLIPIDEMIA 10/15/2010   HYPERTENSION, ESSENTIAL, UNCONTROLLED 10/15/2010   MICROALBUMINURIA 10/15/2010   PERS HX NONCOMPLIANCE W/MED TX PRS HAZARDS HLTH 10/15/2010    Jones Bales, PT, DPT 04/10/2021, 10:38 AM  [facility] [address] [place], [place], [address] Phone: [phone]   Fax:  [phone]  Name: DAVINDER HAFF MRN: [id number] Date of Birth: [birth year]

## 2021-04-23 ENCOUNTER — Ambulatory Visit: Payer: Federal, State, Local not specified - PPO

## 2021-04-23 ENCOUNTER — Ambulatory Visit: Payer: Federal, State, Local not specified - PPO | Admitting: Occupational Therapy

## 2021-04-26 ENCOUNTER — Ambulatory Visit: Payer: Federal, State, Local not specified - PPO | Admitting: Occupational Therapy

## 2021-04-26 ENCOUNTER — Ambulatory Visit: Payer: Federal, State, Local not specified - PPO

## 2021-04-26 ENCOUNTER — Other Ambulatory Visit: Payer: Self-pay

## 2021-04-26 VITALS — BP 176/92

## 2021-04-26 DIAGNOSIS — M6281 Muscle weakness (generalized): Secondary | ICD-10-CM

## 2021-04-26 DIAGNOSIS — R2689 Other abnormalities of gait and mobility: Secondary | ICD-10-CM

## 2021-04-26 DIAGNOSIS — R2681 Unsteadiness on feet: Secondary | ICD-10-CM

## 2021-04-26 DIAGNOSIS — G8929 Other chronic pain: Secondary | ICD-10-CM

## 2021-04-26 DIAGNOSIS — M25511 Pain in right shoulder: Secondary | ICD-10-CM

## 2021-04-26 NOTE — Patient Instructions (Signed)
Access Code: [email or web address] URL: [email or web address] Date: 04/26/2021 Prepared by: Cherly Anderson  Exercises Clamshell with Resistance - 1 x daily - 7 x weekly - 2 sets - 10 reps Supine Bridge - 1 x daily - 7 x weekly - 2 sets - 10 reps Sit to Stand Without Arm Support - 1 x daily - 7 x weekly - 2 sets - 5 reps Walking March - 1 x daily - 7 x weekly - 1 sets - 3-4 reps Side Stepping with Counter Support - 1 x daily - 7 x weekly - 1 sets - 3-4 reps

## 2021-04-26 NOTE — Therapy (Signed)
[facility] [address] [place], [place], [address] Phone: [phone]   Fax:  [phone]  Physical Therapy Treatment  Patient Details  Name: Joshua Case MRN: [id number] Date of Birth: 28-Dec-1960 Referring Provider (PT): Oswald Hillock, MD   Encounter Date: 04/26/2021   PT End of Session - 04/26/21 0850     Visit Number 2    Number of Visits 13    Date for PT Re-Evaluation 05/31/21   POC for 6 weeks + eval   Authorization Type BCBS/Federal PPO (VL; 50 PT/ST/OT)    PT Start Time 0849    PT Stop Time 0930    PT Time Calculation (min) 41 min    Equipment Utilized During Treatment Gait belt    Activity Tolerance Patient tolerated treatment well    Behavior During Therapy WFL for tasks assessed/performed             Past Medical History:  Diagnosis Date   Diabetes mellitus without complication ([place])    Hypertension     History reviewed. No pertinent surgical history.  Vitals:   04/26/21 0851 04/26/21 0859  BP: (!) 170/96 (!) 176/92     Subjective Assessment - 04/26/21 0851     Subjective Pt denies any new issues. Pt did not have his SPC today.    Pertinent History diabetes mellitus type 2, hypertension, sciatica, lumbar spinal stenosis with neurogenic claudication, bilateral osteoarthritis of the hip, bilateral sacroiliac joint dysfunctio    Limitations Lifting;Walking;Standing;House hold activities    How long can you walk comfortably? 20 minutes    Diagnostic tests MRI of brain showed Small focus of acute ischemia at the left cerebral peduncle; Old small vessel infarcts of the right caudate head and left lentiform nucleus    Patient Stated Goals regain his strength    Currently in Pain? Yes    Pain Score 2     Pain Location Arm    Pain Orientation Right    Pain Descriptors / Indicators Sore;Aching    Pain Type Acute pain    Pain Onset 1 to 4 weeks ago    Pain Frequency Intermittent                                [facility] Adult PT Treatment/Exercise - 04/26/21 0852       Transfers   Transfers Sit to Stand;Stand to Sit    Sit to Stand 5: Supervision    Stand to Sit 5: Supervision      Ambulation/Gait   Ambulation/Gait Yes    Ambulation/Gait Assistance 5: Supervision    Ambulation/Gait Assistance Details Verbal cues to increase heel strike on right and decrease lateral sway.    Ambulation Distance (Feet) 230 Feet    Assistive device None    Gait Pattern Step-through pattern;Decreased stance time - right;Decreased hip/knee flexion - right    Ambulation Surface Level;Indoor      Neuro Re-ed    Neuro Re-ed Details  Along counter: marching walk 10' x 4, side stepping 10' x 2. Verbal cues to tighten gluts on stance leg. Pt tends to lean over right and come down quick with left.      Exercises   Exercises Other Exercises    Other Exercises  Sidelying for right clamshell x 10 then added red theraband x 10 with verbal cues for form, bridging x 10 with cues to keep hips level. Sit  to stand 5 x 2 without hands with red band around thighs. BP=168/88 after activities.                    PT Education - 04/26/21 1206     Education Details Started on initial HEP    Person(s) Educated Patient    Methods Explanation;Demonstration;Handout    Comprehension Verbalized understanding              PT Short Term Goals - 04/10/21 1031       PT SHORT TERM GOAL #1   Title Patient will be independent with Final HEP for strength/balance (ALL STGs Due: 05/10/2021)    Baseline no HEP established    Time 3    Period Weeks    Status New    Target Date 05/10/21      PT SHORT TERM GOAL #2   Title Patient will improve 5x sit <> stand to </= 25 seconds with BUE support    Baseline 31.09 secs    Time 3    Period Weeks    Status New      PT SHORT TERM GOAL #3   Title Patient will improve Berg Balance to >/= 40/56 to demo improved balance    Baseline 36/56     Time 3    Period Weeks    Status New               PT Long Term Goals - 04/10/21 1033       PT LONG TERM GOAL #1   Title Patient will be independent with final HEP for strength/balance and daily walking program (ALL LTGs Due: 05/31/21)    Baseline no HEP established    Time 6    Period Weeks    Status New    Target Date 05/31/21      PT LONG TERM GOAL #2   Title Patient will improve Berg Balance to >/= 45/56 to demo reduced fall risk and improved balance    Baseline 36/56    Time 6    Period Weeks    Status New      PT LONG TERM GOAL #3   Title Patient will improve TUG to </= 12 secs with LRAD to demo reduced fall risk    Baseline 13.28 secs    Time 6    Period Weeks    Status New      PT LONG TERM GOAL #4   Title Patient will improve gait speed to >/= 2.5 ft/sec with LRAD to demo improved community mobility    Baseline 1.94 ft/sec    Time 6    Period Weeks    Status New      PT LONG TERM GOAL #5   Title Patient will improve 5x sit <> stand to </= 20 seconds to demo improved balance    Baseline 31.09 secs    Time 6    Period Weeks    Status New      Additional Long Term Goals   Additional Long Term Goals Yes      PT LONG TERM GOAL #6   Title Patient will improve FOTO to >/= 70%    Baseline 58%    Time 6    Period Weeks    Status New                   Plan - 04/26/21 1206     Clinical Impression Statement Pt's BP was  running higher at session today so PT monitored closely throughout. Started on initial strengthening HEP to perform at home. Pt was able to walk without AD but does have some increased lateral sway.    Personal Factors and Comorbidities Comorbidity 3+    Comorbidities diabetes mellitus type 2, hypertension, sciatica, lumbar spinal stenosis with neurogenic claudication, bilateral osteoarthritis of the hip, bilateral sacroiliac joint dysfunction    Examination-Activity Limitations Stairs;Squat;Locomotion Level;Stand     Examination-Participation Restrictions Occupation;Yard Work;Community Activity;Driving    Stability/Clinical Decision Making Stable/Uncomplicated    Rehab Potential Good    PT Frequency 2x / week    PT Duration 6 weeks   plus eval   PT Treatment/Interventions ADLs/Self Care Home Management;Aquatic Therapy;Canalith Repostioning;Moist Heat;DME Instruction;Electrical Stimulation;Cryotherapy;Therapeutic activities;Functional mobility training;Stair training;Gait training;Therapeutic exercise;Balance training;Neuromuscular re-education;Patient/family education;Orthotic Fit/Training;Manual techniques;Passive range of motion;Dry needling;Vestibular;Joint Manipulations    PT Next Visit Plan Check BP. Continue with strengthening right LE, gait working on increasing right stance time and stepping over obstacles, balance training.    Consulted and Agree with Plan of Care Patient             Patient will benefit from skilled therapeutic intervention in order to improve the following deficits and impairments:  Abnormal gait, Decreased balance, Decreased endurance, Decreased activity tolerance, Decreased coordination, Decreased strength, Decreased knowledge of use of DME, Pain, Difficulty walking  Visit Diagnosis: Other abnormalities of gait and mobility  Muscle weakness (generalized)  Unsteadiness on feet     Problem List Patient Active Problem List   Diagnosis Date Noted   Acute CVA (cerebrovascular accident) ([place]) 04/05/2021   Hypertensive emergency 04/05/2021   AKI (acute kidney injury) ([place]) 04/05/2021   Dyspnea on exertion 04/05/2021   Diabetes ([place]) 04/05/2021   HAV (hallux abducto valgus) 10/20/2014   Bunion 10/20/2014   Plantar porokeratosis, acquired 10/20/2014   Lumbar disc disease with radiculopathy 07/13/2013   DIAB W/RENAL MANIFESTS TYPE II/UNS TYPE UNCNTRL 10/15/2010   DYSLIPIDEMIA 10/15/2010   HYPERTENSION, ESSENTIAL, UNCONTROLLED 10/15/2010   MICROALBUMINURIA 10/15/2010    PERS HX NONCOMPLIANCE W/MED TX PRS HAZARDS HLTH 10/15/2010    Electa Sniff, PT, DPT, NCS 04/26/2021, 12:17 PM  [facility] [address] [place], [place], [address] Phone: [phone]   Fax:  [phone]  Name: Joshua Case MRN: [id number] Date of Birth: 27-Dec-1960

## 2021-04-26 NOTE — Therapy (Signed)
[facility] [address] [place], [place], [address] Phone: [phone]   Fax:  [phone]  Occupational Therapy Evaluation  Patient Details  Name: Joshua Case MRN: [id number] Date of Birth: [birth year] Referring Provider (OT): Dr. Darrick Meigs ([name], MD PCP)   Encounter Date: 04/26/2021   OT End of Session - 04/26/21 1440     Visit Number 1    Number of Visits 24    Date for OT Re-Evaluation 07/26/21    Authorization Type BCBS    Authorization - Visit Number 1    Authorization - Number of Visits 75   combined   OT Start Time 0930    OT Stop Time 1000    OT Time Calculation (min) 30 min             Past Medical History:  Diagnosis Date   Diabetes mellitus without complication ([place])    Hypertension     No past surgical history on file.  There were no vitals filed for this visit.   Subjective Assessment - 04/26/21 0935     Pertinent History 60 y.o. male hospitalized  with Rt sided weakness, and Rt facial droop that started several days PTA.  MRI of brain showed Small focus of acute ischemia at the left cerebral peduncle; Old small vessel infarcts of the right caudate head and left lentiform nucleus. Pt was d/c home on 04/08/21. PMH includes: diabetes mellitus type 2, hypertension, sciatica, lumbar spinal stenosis with neurogenic claudication, bilateral osteoarthritis of the hip, bilateral sacroiliac joint dysfunction Pt presents to occupational therapy with the following deficits:    Limitations no overhead lifting, no lifting greater than 5 lbs    Patient Stated Goals improve RUE use    Currently in Pain? Yes    Pain Score 2     Pain Location Arm    Pain Orientation Right    Pain Descriptors / Indicators Aching    Pain Type Acute pain    Pain Onset 1 to 4 weeks ago    Pain Frequency Intermittent    Aggravating Factors  sleeping    Pain Relieving Factors repositioning               [facility] OT  Assessment - 04/26/21 0938       Assessment   Medical Diagnosis CVA    Referring Provider (OT) Dr. Lonell Face, MD PCP   Onset Date/Surgical Date 04/05/21    Hand Dominance Right      Precautions   Precautions Fall    Precaution Comments no overhead reach or lifting greater than 5 lbs per Dr. Louanne Skye in June      Restrictions   Weight Bearing Restrictions No      Balance Screen   Has the patient fallen in the past 6 months No    Has the patient had a decrease in activity level because of a fear of falling?  No    Is the patient reluctant to leave their home because of a fear of falling?  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; lifting packages      ADL   Eating/Feeding Modified independent    Grooming Modified independent    Upper Body Bathing Modified independent    Lower Body Bathing Modified independent    Upper Body Dressing Independent    Lower Body Dressing Modified independent    Toilet Transfer Modified independent    Tub/Shower Transfer Modified independent      IADL   Shopping --   wife handles   Light Housekeeping Does not participate in any housekeeping tasks    Meal Prep Able to complete simple warm meal prep    Medication Management Is responsible for taking medication in correct dosages at correct time      Mobility   Mobility Status Independent      Written Expression   Dominant Hand Right    Handwriting 100% legible   increased time     Vision - History   Baseline Vision No visual deficits      Vision Assessment   Vision Assessment Vision not tested      Activity Tolerance   Activity Tolerance  Comments fatigues quickly per pt report      Cognition   Overall Cognitive Status Within Functional Limits for tasks assessed      Sensation   Light Touch Appears Intact      Coordination   Gross Motor Movements are Fluid and Coordinated No    Fine Motor Movements are Fluid and Coordinated No    9 Hole Peg Test Right;Left    Right 9 Hole Peg Test 38.81    Left 9 Hole Peg Test 33.34    Coordination ataxia RUE      ROM / Strength   AROM / PROM / Strength AROM;Strength      AROM   Overall AROM  Within functional limits for tasks performed      Strength   Overall Strength Deficits    Overall Strength Comments RUE 4+/5, LUE proximal strength 4/5, distal 4+/5      Hand Function   Right Hand Grip (lbs) 64.1    Left Hand Grip (lbs) 71.6                               OT Short Term Goals - 04/26/21 1433       OT SHORT TERM GOAL #1   Title I with inital HEP    Time 4    Period Weeks    Status New      OT SHORT TERM GOAL #2   Title Pt will report RUE pain less than or equal to 2/10 with light functional use.    Time 4    Period Weeks    Status New      OT SHORT TERM GOAL #3   Title Pt will increase RUE grip strength by 5 lbs for increased functional use.    Time 4    Period Weeks    Status New      OT SHORT TERM GOAL #4   Title Pt will retrieve a lightweight object at 90* shoulder flexion demonstrating good control.    Time 4    Period Weeks    Status New               OT Long Term Goals - 04/26/21 1434       OT LONG TERM GOAL #1  Title Pt will perform simulated work activities modified Ily    Time 12    Period Weeks    Status New    Target Date 07/19/21      OT [place] #2   Title I with updated HEP.    Period Weeks    Status New      OT LONG TERM GOAL #3   Title Pt will demonstrate improved fine motor coordination for ADLS  in RUE as evidenced by decreasing 9 hole peg test score by 3 secs.    Time 12    Period Weeks       OT LONG TERM GOAL #4   Title Pt will retrive a 3 lbs object from overhead shelf demonstrating good control and pain no greater than 2 /10.- once cleared by MD for overhead lifting    Time 12    Period Weeks    Status New                   Plan - 04/26/21 1426     Clinical Impression Statement 60 y.o. male hospitalized  with Rt sided weakness, and Rt facial droop that started several days PTA.  MRI of brain showed Small focus of acute ischemia at the left cerebral peduncle; Old small vessel infarcts of the right caudate head and left lentiform nucleus. Pt was d/c home on 04/08/21. PMH includes: diabetes mellitus type 2, hypertension, sciatica, lumbar spinal stenosis with neurogenic claudication, bilateral osteoarthritis of the hip, bilateral sacroiliac joint dysfunction Pt presents to occupational therapy with the following deficits:decreased strength, decreased coordination, ataxia, decreased balance, pain in RUE.   Pt can benefit from skilled occupational therapy to address these deficits in order to maximize pt's safety and I with daily activities.    OT Occupational Profile and History Problem Focused Assessment - Including review of records relating to presenting problem    Occupational performance deficits (Please refer to evaluation for details): ADL's;IADL's;Rest and Sleep;Work;Play;Social Participation    Body Structure / Function / Physical Skills ADL;UE functional use;Balance;Flexibility;Pain;FMC;ROM;Coordination;GMC;Decreased knowledge of precautions;Decreased knowledge of use of DME;IADL;Dexterity;Mobility;Strength    Rehab Potential Good    Clinical Decision Making Limited treatment options, no task modification necessary    Comorbidities Affecting Occupational Performance: May have comorbidities impacting occupational performance    Modification or Assistance to Complete Evaluation  No modification of tasks or assist necessary to complete eval    OT Frequency 2x / week     OT Duration 12 weeks   plus eval, anticipate d/c after 8 weeks   OT Treatment/Interventions Self-care/ADL training;Ultrasound;Energy conservation;Patient/family education;DME and/or AE instruction;Aquatic Therapy;Gait Training;Paraffin;Passive range of motion;Balance training;Fluidtherapy;Cryotherapy;Electrical Stimulation;Splinting;Therapeutic activities;Manual Therapy;Therapeutic exercise;Moist Heat;Neuromuscular education    Plan initiate HEP for RUE, avoid overhead reach until cleared by MD, send note with pt to take to MD next visit to clarify precautions- pt sees Dr. Louanne Skye on 05/08/21    Consulted and Agree with Plan of Care Patient             Patient will benefit from skilled therapeutic intervention in order to improve the following deficits and impairments:   Body Structure / Function / Physical Skills: ADL, UE functional use, Balance, Flexibility, Pain, FMC, ROM, Coordination, GMC, Decreased knowledge of precautions, Decreased knowledge of use of DME, IADL, Dexterity, Mobility, Strength       Visit Diagnosis: Muscle weakness (generalized)  Other abnormalities of gait and mobility  Unsteadiness on feet  Acute pain of right shoulder  Chronic  right shoulder pain    Problem List Patient Active Problem List   Diagnosis Date Noted   Acute CVA (cerebrovascular accident) ([place]) 04/05/2021   Hypertensive emergency 04/05/2021   AKI (acute kidney injury) ([place]) 04/05/2021   Dyspnea on exertion 04/05/2021   Diabetes ([place]) 04/05/2021   HAV (hallux abducto valgus) 10/20/2014   Bunion 10/20/2014   Plantar porokeratosis, acquired 10/20/2014   Lumbar disc disease with radiculopathy 07/13/2013   DIAB W/RENAL MANIFESTS TYPE II/UNS TYPE UNCNTRL 10/15/2010   DYSLIPIDEMIA 10/15/2010   HYPERTENSION, ESSENTIAL, UNCONTROLLED 10/15/2010   MICROALBUMINURIA 10/15/2010   PERS HX NONCOMPLIANCE W/MED TX PRS HAZARDS HLTH 10/15/2010    Jaylin Roundy 04/26/2021, 2:43 PM Theone Murdoch,  OTR/L [phone] Phone: [phone] 2:43 PM 04/26/21  [facility] [address] [place], [place], [address] Phone: [phone]   Fax:  [phone]  Name: Joshua Case MRN: [id number] Date of Birth: [birth year]

## 2021-04-30 ENCOUNTER — Other Ambulatory Visit: Payer: Self-pay

## 2021-04-30 ENCOUNTER — Telehealth: Payer: Self-pay

## 2021-04-30 ENCOUNTER — Ambulatory Visit: Payer: Federal, State, Local not specified - PPO

## 2021-04-30 ENCOUNTER — Ambulatory Visit: Payer: Federal, State, Local not specified - PPO | Admitting: Occupational Therapy

## 2021-04-30 DIAGNOSIS — M6281 Muscle weakness (generalized): Secondary | ICD-10-CM | POA: Diagnosis not present

## 2021-04-30 DIAGNOSIS — M25511 Pain in right shoulder: Secondary | ICD-10-CM

## 2021-04-30 NOTE — Telephone Encounter (Signed)
Hello Dr. Louanne Skye,   You see [place] again on 05/08/21. I noticed from your last office note in June there were restrictions for overhead reaching/lifting and 5 lb lifting restriction. You also ordered a EMG/NCS which looks like has not been done (pt does not remember this and no reports are in Epic that I can find). He has since had a stroke in early August, so this could be the reason it was not scheduled?  We are currently seeing him in therapy (PT/OT) for the stroke, and wanted clarification if precautions still stand or if we can work on overhead ROM for RUE? And also if you wanted him to proceed with scheduling the EMG/NCS?   Thank you,  Redmond Baseman, OTR/L

## 2021-04-30 NOTE — Patient Instructions (Addendum)
  1. Grip Strengthening (Resistive Putty)   Squeeze putty using thumb and all fingers. Repeat _20___ times. Do __2__ sessions per day.   2. Roll putty into tube on table and pinch between first two fingers and thumb x 10 reps. Do 2 sessions per day.     Coordination Activities  Perform the following activities for 10 minutes 1-2 times per day with right hand(s).  Rotate ball in fingertips (clockwise and counter-clockwise). Deal cards with your thumb (Hold deck in hand and push card off top with thumb). Rotate one card in hand (clockwise and counter-clockwise). Pick up coins one at a time until you get 5 in your hand, then move coins from palm to fingertips to stack one at a time. Practice writing.  SHOULDER: Flexion Bilateral    Raise arms to EYE LEVEL ONLY at same speed holding paper towel roll. Keep elbows straight. _10__ reps per set, _2__ sets per day

## 2021-04-30 NOTE — Therapy (Signed)
[facility] [address] [place], [place], [address] Phone: [phone]   Fax:  [phone]  Occupational Therapy Treatment  Patient Details  Name: Joshua Case MRN: [id number] Date of Birth: 09-01-1961 Referring Provider (OT): Dr. Darrick Meigs ([name], MD PCP)   Encounter Date: 04/30/2021   OT End of Session - 04/30/21 1124     Visit Number 2    Number of Visits 24    Date for OT Re-Evaluation 07/26/21    Authorization Type BCBS    Authorization - Visit Number 2    Authorization - Number of Visits 75   combined   OT Start Time 0930    OT Stop Time 1015    OT Time Calculation (min) 45 min    Activity Tolerance Patient tolerated treatment well    Behavior During Therapy [facility] for tasks assessed/performed             Past Medical History:  Diagnosis Date   Diabetes mellitus without complication ([place])    Hypertension     No past surgical history on file.  There were no vitals filed for this visit.   Subjective Assessment - 04/30/21 0941     Subjective  Pt reports MD recently added a different BP medicine, but unable to update in medications d/t not knowing name or dosage (pt to bring in next session)    Pertinent History 60 y.o. male hospitalized  with Rt sided weakness, and Rt facial droop that started several days PTA.  MRI of brain showed Small focus of acute ischemia at the left cerebral peduncle; Old small vessel infarcts of the right caudate head and left lentiform nucleus. Pt was d/c home on 04/08/21. PMH includes: diabetes mellitus type 2, hypertension, sciatica, lumbar spinal stenosis with neurogenic claudication, bilateral osteoarthritis of the hip, bilateral sacroiliac joint dysfunction Pt presents to occupational therapy with the following deficits:    Limitations no overhead lifting, no lifting greater than 5 lbs    Patient Stated Goals improve RUE use    Currently in Pain? Yes    Pain Score 2      Pain Location Arm   upper   Pain Orientation Right    Pain Descriptors / Indicators Aching;Dull    Pain Type Acute pain    Pain Onset 1 to 4 weeks ago    Pain Frequency Intermittent    Aggravating Factors  sleeping position    Pain Relieving Factors changing positions            See below and pt instructions for HEP - pt returned demo of each and issued green resistance putty for putty HEP. Also modified sh flexion ex to eye level only.   Discussed precautions from MD - pt reported these restrictions were pre-covid however most recent office note from MD states restrictions currently. Reviewed these. Also discussed MD's recommendation for EMG/NCS however does not look like pt had this procedure (possibly d/t stroke?) - This therapist to send encounter in Epic to Dr. Louanne Skye.                       OT Education - 04/30/21 1010     Education Details coordination HEP, putty HEP, shoulder flex ex    Person(s) Educated Patient    Methods Explanation;Demonstration;Handout;Verbal cues    Comprehension Verbalized understanding;Returned demonstration;Verbal cues required              OT Short Term Goals -  04/30/21 1125       OT SHORT TERM GOAL #1   Title I with inital HEP    Time 4    Period Weeks    Status On-going      OT SHORT TERM GOAL #2   Title Pt will report RUE pain less than or equal to 2/10 with light functional use.    Time 4    Period Weeks    Status New      OT SHORT TERM GOAL #3   Title Pt will increase RUE grip strength by 5 lbs for increased functional use.    Time 4    Period Weeks    Status New      OT SHORT TERM GOAL #4   Title Pt will retrieve a lightweight object at 90* shoulder flexion demonstrating good control.    Time 4    Period Weeks    Status New               OT Long Term Goals - 04/26/21 1434       OT LONG TERM GOAL #1   Title Pt will perform simulated work activities modified Ily    Time 12    Period Weeks     Status New    Target Date 07/19/21      OT LONG TERM GOAL #2   Title I with updated HEP.    Period Weeks    Status New      OT LONG TERM GOAL #3   Title Pt will demonstrate improved fine motor coordination for ADLS  in RUE as evidenced by decreasing 9 hole peg test score by 3 secs.    Time 12    Period Weeks      OT LONG TERM GOAL #4   Title Pt will retrive a 3 lbs object from overhead shelf demonstrating good control and pain no greater than 2 /10.- once cleared by MD for overhead lifting    Time 12    Period Weeks    Status New                   Plan - 04/30/21 1125     Clinical Impression Statement Pt progressing with HEP for RUE. Pt reports he sees Dr. Louanne Skye in eary September - will send telephone encounter re: current RUE restrictions as pt does not remember note from last session (and therefore does not know where it is)    OT Occupational Profile and History Problem Focused Assessment - Including review of records relating to presenting problem    Occupational performance deficits (Please refer to evaluation for details): ADL's;IADL's;Rest and Sleep;Work;Play;Social Participation    Body Structure / Function / Physical Skills ADL;UE functional use;Balance;Flexibility;Pain;FMC;ROM;Coordination;GMC;Decreased knowledge of precautions;Decreased knowledge of use of DME;IADL;Dexterity;Mobility;Strength    Rehab Potential Good    Clinical Decision Making Limited treatment options, no task modification necessary    Comorbidities Affecting Occupational Performance: May have comorbidities impacting occupational performance    Modification or Assistance to Complete Evaluation  No modification of tasks or assist necessary to complete eval    OT Frequency 2x / week    OT Duration 12 weeks   plus eval, anticipate d/c after 8 weeks   OT Treatment/Interventions Self-care/ADL training;Ultrasound;Energy conservation;Patient/family education;DME and/or AE instruction;Aquatic  Therapy;Gait Training;Paraffin;Passive range of motion;Balance training;Fluidtherapy;Cryotherapy;Electrical Stimulation;Splinting;Therapeutic activities;Manual Therapy;Therapeutic exercise;Moist Heat;Neuromuscular education    Plan review HEP for RUE, avoid overhead reach until cleared by MD    Consulted  and Agree with Plan of Care Patient             Patient will benefit from skilled therapeutic intervention in order to improve the following deficits and impairments:   Body Structure / Function / Physical Skills: ADL, UE functional use, Balance, Flexibility, Pain, FMC, ROM, Coordination, GMC, Decreased knowledge of precautions, Decreased knowledge of use of DME, IADL, Dexterity, Mobility, Strength       Visit Diagnosis: Muscle weakness (generalized)  Acute pain of right shoulder    Problem List Patient Active Problem List   Diagnosis Date Noted   Acute CVA (cerebrovascular accident) ([place]) 04/05/2021   Hypertensive emergency 04/05/2021   AKI (acute kidney injury) ([place]) 04/05/2021   Dyspnea on exertion 04/05/2021   Diabetes ([place]) 04/05/2021   HAV (hallux abducto valgus) 10/20/2014   Bunion 10/20/2014   Plantar porokeratosis, acquired 10/20/2014   Lumbar disc disease with radiculopathy 07/13/2013   DIAB W/RENAL MANIFESTS TYPE II/UNS TYPE UNCNTRL 10/15/2010   DYSLIPIDEMIA 10/15/2010   HYPERTENSION, ESSENTIAL, UNCONTROLLED 10/15/2010   MICROALBUMINURIA 10/15/2010   PERS HX NONCOMPLIANCE W/MED TX PRS HAZARDS HLTH 10/15/2010    Carey Bullocks, OTR/L 04/30/2021, 11:29 AM  [facility] [address] [place], [place], [address] Phone: [phone]   Fax:  [phone]  Name: KIRT CHEW MRN: [id number] Date of Birth: 1961/07/05

## 2021-05-02 ENCOUNTER — Ambulatory Visit: Payer: Federal, State, Local not specified - PPO

## 2021-05-02 ENCOUNTER — Other Ambulatory Visit: Payer: Self-pay

## 2021-05-02 VITALS — BP 168/100

## 2021-05-02 DIAGNOSIS — R2689 Other abnormalities of gait and mobility: Secondary | ICD-10-CM

## 2021-05-02 DIAGNOSIS — M6281 Muscle weakness (generalized): Secondary | ICD-10-CM | POA: Insufficient documentation

## 2021-05-02 NOTE — Therapy (Signed)
[facility] [address] [place], [place], [address] Phone: [phone]   Fax:  [phone]  Physical Therapy Treatment- arrived no charge  Patient Details  Name: Joshua Case MRN: [id number] Date of Birth: 06/21/1961 Referring Provider (PT): Oswald Hillock, MD   Encounter Date: 05/02/2021   PT End of Session - 05/02/21 0942     Visit Number 2    Number of Visits 13    Date for PT Re-Evaluation 05/31/21   POC for 6 weeks + eval   Authorization Type BCBS/Federal PPO (VL; 50 PT/ST/OT)    PT Start Time 0940    PT Stop Time 5284   arrived no charge   PT Time Calculation (min) 15 min    Equipment Utilized During Treatment Gait belt    Activity Tolerance Patient tolerated treatment well    Behavior During Therapy WFL for tasks assessed/performed             Past Medical History:  Diagnosis Date   Diabetes mellitus without complication ([place])    Hypertension     No past surgical history on file.  Vitals:   05/02/21 0942  BP: (!) 168/100     Subjective Assessment - 05/02/21 0942     Subjective Pt reports he is a Ryden stiff today. Left leg bothering him and does have sciatic issues in that leg. Pt reports that metoprolol and valsartan were stopped and new BP med started but can't remember the name. Dr. Ayesha Rumpf  on [place]. Pt had not had any BP meds yet this morning reporting he usually takes around noon.    Pertinent History diabetes mellitus type 2, hypertension, sciatica, lumbar spinal stenosis with neurogenic claudication, bilateral osteoarthritis of the hip, bilateral sacroiliac joint dysfunctio    Limitations Lifting;Walking;Standing;House hold activities    How long can you walk comfortably? 20 minutes    Diagnostic tests MRI of brain showed Small focus of acute ischemia at the left cerebral peduncle; Old small vessel infarcts of the right caudate head and left lentiform nucleus    Patient Stated Goals  regain his strength    Currently in Pain? Yes    Pain Score 4     Pain Location Leg    Pain Orientation Left    Pain Descriptors / Indicators Aching    Pain Radiating Towards to shin area    Pain Onset 1 to 4 weeks ago    Pain Frequency Constant                               [facility] Adult PT Treatment/Exercise - 05/02/21 0944       Ambulation/Gait   Ambulation/Gait --                      PT Short Term Goals - 04/10/21 1031       PT SHORT TERM GOAL #1   Title Patient will be independent with Final HEP for strength/balance (ALL STGs Due: 05/10/2021)    Baseline no HEP established    Time 3    Period Weeks    Status New    Target Date 05/10/21      PT SHORT TERM GOAL #2   Title Patient will improve 5x sit <> stand to </= 25 seconds with BUE support    Baseline 31.09 secs    Time 3    Period Weeks  Status New      PT SHORT TERM GOAL #3   Title Patient will improve Berg Balance to >/= 40/56 to demo improved balance    Baseline 36/56    Time 3    Period Weeks    Status New               PT Long Term Goals - 04/10/21 1033       PT LONG TERM GOAL #1   Title Patient will be independent with final HEP for strength/balance and daily walking program (ALL LTGs Due: 05/31/21)    Baseline no HEP established    Time 6    Period Weeks    Status New    Target Date 05/31/21      PT LONG TERM GOAL #2   Title Patient will improve Berg Balance to >/= 45/56 to demo reduced fall risk and improved balance    Baseline 36/56    Time 6    Period Weeks    Status New      PT LONG TERM GOAL #3   Title Patient will improve TUG to </= 12 secs with LRAD to demo reduced fall risk    Baseline 13.28 secs    Time 6    Period Weeks    Status New      PT LONG TERM GOAL #4   Title Patient will improve gait speed to >/= 2.5 ft/sec with LRAD to demo improved community mobility    Baseline 1.94 ft/sec    Time 6    Period Weeks    Status New       PT LONG TERM GOAL #5   Title Patient will improve 5x sit <> stand to </= 20 seconds to demo improved balance    Baseline 31.09 secs    Time 6    Period Weeks    Status New      Additional Long Term Goals   Additional Long Term Goals Yes      PT LONG TERM GOAL #6   Title Patient will improve FOTO to >/= 70%    Baseline 58%    Time 6    Period Weeks    Status New                   Plan - 05/02/21 3299     Clinical Impression Statement Pt arrived and BP too high to participate. Pt reporting BP meds recently changed but did not know the new medication. Pt had not taken any BP meds upon arrival. PT called his doctor, Dr. Ayesha Rumpf, and left message on nurse line. Instructed pt to take meds when gets home and would be good to take BP meds prior to coming to therapy. Pt to monitor BP and if any changes in status seek medical attention immediately.    Personal Factors and Comorbidities Comorbidity 3+    Comorbidities diabetes mellitus type 2, hypertension, sciatica, lumbar spinal stenosis with neurogenic claudication, bilateral osteoarthritis of the hip, bilateral sacroiliac joint dysfunction    Examination-Activity Limitations Stairs;Squat;Locomotion Level;Stand    Examination-Participation Restrictions Occupation;Yard Work;Community Activity;Driving    Stability/Clinical Decision Making Stable/Uncomplicated    Rehab Potential Good    PT Frequency 2x / week    PT Duration 6 weeks   plus eval   PT Treatment/Interventions ADLs/Self Care Home Management;Aquatic Therapy;Canalith Repostioning;Moist Heat;DME Instruction;Electrical Stimulation;Cryotherapy;Therapeutic activities;Functional mobility training;Stair training;Gait training;Therapeutic exercise;Balance training;Neuromuscular re-education;Patient/family education;Orthotic Fit/Training;Manual techniques;Passive range of motion;Dry needling;Vestibular;Joint Manipulations  PT Next Visit Plan Check BP. Continue with strengthening right  LE, gait working on increasing right stance time and stepping over obstacles, balance training.    Consulted and Agree with Plan of Care Patient             Patient will benefit from skilled therapeutic intervention in order to improve the following deficits and impairments:  Abnormal gait, Decreased balance, Decreased endurance, Decreased activity tolerance, Decreased coordination, Decreased strength, Decreased knowledge of use of DME, Pain, Difficulty walking  Visit Diagnosis: Other abnormalities of gait and mobility     Problem List Patient Active Problem List   Diagnosis Date Noted   Acute CVA (cerebrovascular accident) ([place]) 04/05/2021   Hypertensive emergency 04/05/2021   AKI (acute kidney injury) ([place]) 04/05/2021   Dyspnea on exertion 04/05/2021   Diabetes ([place]) 04/05/2021   HAV (hallux abducto valgus) 10/20/2014   Bunion 10/20/2014   Plantar porokeratosis, acquired 10/20/2014   Lumbar disc disease with radiculopathy 07/13/2013   DIAB W/RENAL MANIFESTS TYPE II/UNS TYPE UNCNTRL 10/15/2010   DYSLIPIDEMIA 10/15/2010   HYPERTENSION, ESSENTIAL, UNCONTROLLED 10/15/2010   MICROALBUMINURIA 10/15/2010   PERS HX NONCOMPLIANCE W/MED TX PRS HAZARDS HLTH 10/15/2010    Electa Sniff, PT, DPT, NCS 05/02/2021, 10:03 AM  [facility] [address] [place], [place], [address] Phone: [phone]   Fax:  [phone]  Name: Joshua Case MRN: [id number] Date of Birth: 12-31-1960

## 2021-05-07 ENCOUNTER — Other Ambulatory Visit: Payer: Self-pay

## 2021-05-07 ENCOUNTER — Ambulatory Visit: Payer: Federal, State, Local not specified - PPO | Attending: Family Medicine

## 2021-05-07 VITALS — BP 180/96

## 2021-05-07 DIAGNOSIS — R2689 Other abnormalities of gait and mobility: Secondary | ICD-10-CM

## 2021-05-07 NOTE — Therapy (Signed)
[facility] [address] [place], [place], [address] Phone: [phone]   Fax:  [phone]  Physical Therapy Treatment-arrived no charge  Patient Details  Name: Joshua Case MRN: [id number] Date of Birth: 01-18-1961 Referring Provider (PT): Oswald Hillock, MD   Encounter Date: 05/07/2021   PT End of Session - 05/07/21 0939     Visit Number 2    Number of Visits 13    Date for PT Re-Evaluation 05/31/21   POC for 6 weeks + eval   Authorization Type BCBS/Federal PPO (VL; 50 PT/ST/OT)    PT Start Time 0938    PT Stop Time 0952   arrived no charge   PT Time Calculation (min) 14 min    Equipment Utilized During Treatment Gait belt    Activity Tolerance Patient tolerated treatment well    Behavior During Therapy WFL for tasks assessed/performed             Past Medical History:  Diagnosis Date   Diabetes mellitus without complication ([place])    Hypertension     History reviewed. No pertinent surgical history.  Vitals:   05/07/21 0940 05/07/21 0943  BP: (!) 180/100 (!) 180/96     Subjective Assessment - 05/07/21 0940     Subjective Pt reports that he did take the new BP med this morning. Still not sure what it is.    Pertinent History diabetes mellitus type 2, hypertension, sciatica, lumbar spinal stenosis with neurogenic claudication, bilateral osteoarthritis of the hip, bilateral sacroiliac joint dysfunctio    Limitations Lifting;Walking;Standing;House hold activities    How long can you walk comfortably? 20 minutes    Diagnostic tests MRI of brain showed Small focus of acute ischemia at the left cerebral peduncle; Old small vessel infarcts of the right caudate head and left lentiform nucleus    Patient Stated Goals regain his strength    Currently in Pain? Yes    Pain Score 5     Pain Location Back    Pain Descriptors / Indicators Sharp    Pain Type Chronic pain    Pain Onset 1 to 4 weeks ago    Pain  Frequency Constant    Aggravating Factors  prolonged standing of walker    Pain Relieving Factors sitting, bending                                         PT Short Term Goals - 04/10/21 1031       PT SHORT TERM GOAL #1   Title Patient will be independent with Final HEP for strength/balance (ALL STGs Due: 05/10/2021)    Baseline no HEP established    Time 3    Period Weeks    Status New    Target Date 05/10/21      PT SHORT TERM GOAL #2   Title Patient will improve 5x sit <> stand to </= 25 seconds with BUE support    Baseline 31.09 secs    Time 3    Period Weeks    Status New      PT SHORT TERM GOAL #3   Title Patient will improve Berg Balance to >/= 40/56 to demo improved balance    Baseline 36/56    Time 3    Period Weeks    Status New  PT Long Term Goals - 04/10/21 1033       PT LONG TERM GOAL #1   Title Patient will be independent with final HEP for strength/balance and daily walking program (ALL LTGs Due: 05/31/21)    Baseline no HEP established    Time 6    Period Weeks    Status New    Target Date 05/31/21      PT LONG TERM GOAL #2   Title Patient will improve Berg Balance to >/= 45/56 to demo reduced fall risk and improved balance    Baseline 36/56    Time 6    Period Weeks    Status New      PT LONG TERM GOAL #3   Title Patient will improve TUG to </= 12 secs with LRAD to demo reduced fall risk    Baseline 13.28 secs    Time 6    Period Weeks    Status New      PT LONG TERM GOAL #4   Title Patient will improve gait speed to >/= 2.5 ft/sec with LRAD to demo improved community mobility    Baseline 1.94 ft/sec    Time 6    Period Weeks    Status New      PT LONG TERM GOAL #5   Title Patient will improve 5x sit <> stand to </= 20 seconds to demo improved balance    Baseline 31.09 secs    Time 6    Period Weeks    Status New      Additional Long Term Goals   Additional Long Term Goals Yes       PT LONG TERM GOAL #6   Title Patient will improve FOTO to >/= 70%    Baseline 58%    Time 6    Period Weeks    Status New                   Plan - 05/07/21 0955     Clinical Impression Statement Pt's BP continues to be elevated so treatment witheld. Pt reports he did take new BP med this morning as well. Pt supposed to have a virtual visit with MD on Friday. PT let him know she did call last week about BP and left message but have not heard back. Pt going to call today again. PT wrote down BP readings for today. Cancelled remaining visits for PT and OT this week until PT can get with MD to address elevated BP.    Personal Factors and Comorbidities Comorbidity 3+    Comorbidities diabetes mellitus type 2, hypertension, sciatica, lumbar spinal stenosis with neurogenic claudication, bilateral osteoarthritis of the hip, bilateral sacroiliac joint dysfunction    Examination-Activity Limitations Stairs;Squat;Locomotion Level;Stand    Examination-Participation Restrictions Occupation;Yard Work;Community Activity;Driving    Stability/Clinical Decision Making Stable/Uncomplicated    Rehab Potential Good    PT Frequency 2x / week    PT Duration 6 weeks   plus eval   PT Treatment/Interventions ADLs/Self Care Home Management;Aquatic Therapy;Canalith Repostioning;Moist Heat;DME Instruction;Electrical Stimulation;Cryotherapy;Therapeutic activities;Functional mobility training;Stair training;Gait training;Therapeutic exercise;Balance training;Neuromuscular re-education;Patient/family education;Orthotic Fit/Training;Manual techniques;Passive range of motion;Dry needling;Vestibular;Joint Manipulations    PT Next Visit Plan Check BP. Had placed on hold this week due to elevated BP and pt was going to be speaking with MD with virtual visit Friday as well.Continue with strengthening right LE, gait working on increasing right stance time and stepping over obstacles, balance training.    Recommended Other  Services  Pt notified OT, Curt Bears, of holding therapy this week.    Consulted and Agree with Plan of Care Patient             Patient will benefit from skilled therapeutic intervention in order to improve the following deficits and impairments:  Abnormal gait, Decreased balance, Decreased endurance, Decreased activity tolerance, Decreased coordination, Decreased strength, Decreased knowledge of use of DME, Pain, Difficulty walking  Visit Diagnosis: Other abnormalities of gait and mobility     Problem List Patient Active Problem List   Diagnosis Date Noted   Acute CVA (cerebrovascular accident) ([facility]) 04/05/2021   Hypertensive emergency 04/05/2021   AKI (acute kidney injury) ([place]) 04/05/2021   Dyspnea on exertion 04/05/2021   Diabetes ([facility]) 04/05/2021   HAV (hallux abducto valgus) 10/20/2014   Bunion 10/20/2014   Plantar porokeratosis, acquired 10/20/2014   Lumbar disc disease with radiculopathy 07/13/2013   DIAB W/RENAL MANIFESTS TYPE II/UNS TYPE UNCNTRL 10/15/2010   DYSLIPIDEMIA 10/15/2010   HYPERTENSION, ESSENTIAL, UNCONTROLLED 10/15/2010   MICROALBUMINURIA 10/15/2010   PERS HX NONCOMPLIANCE W/MED TX PRS HAZARDS HLTH 10/15/2010    Electa Sniff, PT, DPT, NCS 05/07/2021, 9:59 AM  [facility] [address] [place], [place], [address] Phone: [phone]   Fax:  [phone]  Name: JANCARLO BIERMANN MRN: [id number] Date of Birth: 04-26-1961

## 2021-05-08 ENCOUNTER — Ambulatory Visit: Payer: Federal, State, Local not specified - PPO | Admitting: Specialist

## 2021-05-08 ENCOUNTER — Ambulatory Visit: Payer: Federal, State, Local not specified - PPO | Admitting: Occupational Therapy

## 2021-05-09 ENCOUNTER — Ambulatory Visit: Payer: Federal, State, Local not specified - PPO

## 2021-05-14 ENCOUNTER — Ambulatory Visit: Payer: Federal, State, Local not specified - PPO | Admitting: Occupational Therapy

## 2021-05-14 ENCOUNTER — Telehealth: Payer: Self-pay | Admitting: Occupational Therapy

## 2021-05-14 ENCOUNTER — Ambulatory Visit: Payer: Federal, State, Local not specified - PPO

## 2021-05-14 NOTE — Telephone Encounter (Signed)
Therapist left patient a message regarding missed appointments today, as well as information regarding next scheduled appointments. Pt was instructed to call and cancel future appointments if his BP is not regulated. Theone Murdoch, OTR/L [phone] Phone: [phone] 2:24 PM 05/14/21

## 2021-05-16 ENCOUNTER — Ambulatory Visit: Payer: Federal, State, Local not specified - PPO

## 2021-05-16 ENCOUNTER — Ambulatory Visit: Payer: Federal, State, Local not specified - PPO | Admitting: Occupational Therapy

## 2021-05-20 ENCOUNTER — Encounter: Payer: Self-pay | Admitting: Adult Health

## 2021-05-20 ENCOUNTER — Inpatient Hospital Stay: Payer: Federal, State, Local not specified - PPO | Admitting: Adult Health

## 2021-05-21 ENCOUNTER — Ambulatory Visit: Payer: Federal, State, Local not specified - PPO | Admitting: Occupational Therapy

## 2021-05-21 ENCOUNTER — Other Ambulatory Visit: Payer: Self-pay

## 2021-05-21 ENCOUNTER — Ambulatory Visit: Payer: Federal, State, Local not specified - PPO

## 2021-05-21 VITALS — BP 180/98

## 2021-05-21 DIAGNOSIS — M6281 Muscle weakness (generalized): Secondary | ICD-10-CM

## 2021-05-21 NOTE — Therapy (Signed)
[facility] [address] [place], [place], [address] Phone: [phone]   Fax:  [phone]  Physical Therapy Treatment-arrived no charge  Patient Details  Name: Joshua Case MRN: [id number] Date of Birth: [birth year] Referring Provider (PT): Oswald Hillock, MD   Encounter Date: 05/21/2021   PT End of Session - 05/21/21 0850     Visit Number 2    Number of Visits 13    Date for PT Re-Evaluation 05/31/21   POC for 6 weeks + eval   Authorization Type BCBS/Federal PPO (VL; 50 PT/ST/OT)    PT Start Time 4580   arrived no charge   PT Stop Time 0908    PT Time Calculation (min) 19 min    Equipment Utilized During Treatment Gait belt    Activity Tolerance Patient tolerated treatment well    Behavior During Therapy WFL for tasks assessed/performed             Past Medical History:  Diagnosis Date   Diabetes mellitus without complication ([place])    Hypertension     History reviewed. No pertinent surgical history.  Vitals:   05/21/21 0850 05/21/21 0859  BP: (!) 160/102 (!) 180/98     Subjective Assessment - 05/21/21 0850     Subjective Pt reports that MD has not made any med changes. He has been checking and still running  a Mathisen high at times.  Pt reports that he went to [place] today for the football game and did pretty well walking around there.    Pertinent History diabetes mellitus type 2, hypertension, sciatica, lumbar spinal stenosis with neurogenic claudication, bilateral osteoarthritis of the hip, bilateral sacroiliac joint dysfunctio    Limitations Lifting;Walking;Standing;House hold activities    How long can you walk comfortably? 20 minutes    Diagnostic tests MRI of brain showed Small focus of acute ischemia at the left cerebral peduncle; Old small vessel infarcts of the right caudate head and left lentiform nucleus    Patient Stated Goals regain his strength    Currently in Pain? Yes    Pain Score 3      Pain Location Back    Pain Orientation Lower    Pain Descriptors / Indicators Other (Comment)   stiff   Pain Type Chronic pain    Pain Onset 1 to 4 weeks ago    Pain Frequency Intermittent                                          PT Short Term Goals - 04/10/21 1031       PT SHORT TERM GOAL #1   Title Patient will be independent with Final HEP for strength/balance (ALL STGs Due: 05/10/2021)    Baseline no HEP established    Time 3    Period Weeks    Status New    Target Date 05/10/21      PT SHORT TERM GOAL #2   Title Patient will improve 5x sit <> stand to </= 25 seconds with BUE support    Baseline 31.09 secs    Time 3    Period Weeks    Status New      PT SHORT TERM GOAL #3   Title Patient will improve Berg Balance to >/= 40/56 to demo improved balance    Baseline 36/56    Time 3  Period Weeks    Status New               PT Long Term Goals - 04/10/21 1033       PT LONG TERM GOAL #1   Title Patient will be independent with final HEP for strength/balance and daily walking program (ALL LTGs Due: 05/31/21)    Baseline no HEP established    Time 6    Period Weeks    Status New    Target Date 05/31/21      PT LONG TERM GOAL #2   Title Patient will improve Berg Balance to >/= 45/56 to demo reduced fall risk and improved balance    Baseline 36/56    Time 6    Period Weeks    Status New      PT LONG TERM GOAL #3   Title Patient will improve TUG to </= 12 secs with LRAD to demo reduced fall risk    Baseline 13.28 secs    Time 6    Period Weeks    Status New      PT LONG TERM GOAL #4   Title Patient will improve gait speed to >/= 2.5 ft/sec with LRAD to demo improved community mobility    Baseline 1.94 ft/sec    Time 6    Period Weeks    Status New      PT LONG TERM GOAL #5   Title Patient will improve 5x sit <> stand to </= 20 seconds to demo improved balance    Baseline 31.09 secs    Time 6    Period Weeks     Status New      Additional Long Term Goals   Additional Long Term Goals Yes      PT LONG TERM GOAL #6   Title Patient will improve FOTO to >/= 70%    Baseline 58%    Time 6    Period Weeks    Status New                   Plan - 05/21/21 0910     Clinical Impression Statement Pt's treatment was witheld due to continued elevated BP. PT called Dr. Zenia Resides office and left message again on nurse line about continued elevated BP. No meds were changed at virtual visit 2 weeks ago. PT asked them to call pt to be seen to address BP. PT instructed pt to if he had not seen them prior to next visit to call and cancel. BP needs to be controlled to be safe to participate in therapy. Pt verbalized understanding.    Personal Factors and Comorbidities Comorbidity 3+    Comorbidities diabetes mellitus type 2, hypertension, sciatica, lumbar spinal stenosis with neurogenic claudication, bilateral osteoarthritis of the hip, bilateral sacroiliac joint dysfunction    Examination-Activity Limitations Stairs;Squat;Locomotion Level;Stand    Examination-Participation Restrictions Occupation;Yard Work;Community Activity;Driving    Stability/Clinical Decision Making Stable/Uncomplicated    Rehab Potential Good    PT Frequency 2x / week    PT Duration 6 weeks   plus eval   PT Treatment/Interventions ADLs/Self Care Home Management;Aquatic Therapy;Canalith Repostioning;Moist Heat;DME Instruction;Electrical Stimulation;Cryotherapy;Therapeutic activities;Functional mobility training;Stair training;Gait training;Therapeutic exercise;Balance training;Neuromuscular re-education;Patient/family education;Orthotic Fit/Training;Manual techniques;Passive range of motion;Dry needling;Vestibular;Joint Manipulations    PT Next Visit Plan Check BP. PT holding until BP has been addressed by MD. Will be due for goal checks when able to participate. Continue with strengthening right LE, gait working on increasing right stance  time and stepping over obstacles, balance training.    Consulted and Agree with Plan of Care Patient             Patient will benefit from skilled therapeutic intervention in order to improve the following deficits and impairments:  Abnormal gait, Decreased balance, Decreased endurance, Decreased activity tolerance, Decreased coordination, Decreased strength, Decreased knowledge of use of DME, Pain, Difficulty walking  Visit Diagnosis: Muscle weakness (generalized)     Problem List Patient Active Problem List   Diagnosis Date Noted   Acute CVA (cerebrovascular accident) ([facility]) 04/05/2021   Hypertensive emergency 04/05/2021   AKI (acute kidney injury) ([facility]) 04/05/2021   Dyspnea on exertion 04/05/2021   Diabetes ([facility]) 04/05/2021   HAV (hallux abducto valgus) 10/20/2014   Bunion 10/20/2014   Plantar porokeratosis, acquired 10/20/2014   Lumbar disc disease with radiculopathy 07/13/2013   DIAB W/RENAL MANIFESTS TYPE II/UNS TYPE UNCNTRL 10/15/2010   DYSLIPIDEMIA 10/15/2010   HYPERTENSION, ESSENTIAL, UNCONTROLLED 10/15/2010   MICROALBUMINURIA 10/15/2010   PERS HX NONCOMPLIANCE W/MED TX PRS HAZARDS HLTH 10/15/2010    Electa Sniff, PT, DPT, NCS 05/21/2021, 9:13 AM  [facility] [address] [place], [place], [address] Phone: [phone]   Fax:  [phone]  Name: CRUISE BAUMGARDNER MRN: [id number] Date of Birth: 1961/01/31

## 2021-05-21 NOTE — Therapy (Signed)
[facility] [address] [place], [place], [address] Phone: [phone]   Fax:  [phone]  Patient Details  Name: Joshua Case MRN: [id number] Date of Birth: 1961/04/10 Referring Provider:  Oswald Hillock, MD  Encounter Date: 05/21/2021 Pt arrived for PT however pt's BP was elevated so OT treatment was witheld. See PT note.  Khalia Gong 05/21/2021, 10:18 AM  [facility] [address] [place], [place], [address] Phone: [phone]   Fax:  [phone]

## 2021-05-23 ENCOUNTER — Ambulatory Visit: Payer: Federal, State, Local not specified - PPO

## 2021-05-23 ENCOUNTER — Ambulatory Visit: Payer: Federal, State, Local not specified - PPO | Admitting: Occupational Therapy

## 2021-05-28 ENCOUNTER — Telehealth: Payer: Self-pay

## 2021-05-28 ENCOUNTER — Ambulatory Visit: Payer: Federal, State, Local not specified - PPO

## 2021-05-28 ENCOUNTER — Ambulatory Visit: Payer: Federal, State, Local not specified - PPO | Admitting: Occupational Therapy

## 2021-05-28 NOTE — Telephone Encounter (Signed)
PT called patient to follow-up due to no show today. Left message that pt had one final visit scheduled for this Thursday with the time and asked pt to call if unable to make it due to BP as would need to decide on plan from there. Cherly Anderson, PT, DPT, NCS

## 2021-05-30 ENCOUNTER — Ambulatory Visit: Payer: Federal, State, Local not specified - PPO

## 2021-05-30 ENCOUNTER — Ambulatory Visit: Payer: Federal, State, Local not specified - PPO | Admitting: Occupational Therapy

## 2021-05-31 NOTE — Therapy (Signed)
[facility] [address] [place], [place], [address] Phone: [phone]   Fax:  [phone]  Patient Details  Name: Joshua Case MRN: [id number] Date of Birth: [birth year] Referring Provider:  No ref. provider found  Encounter Date: 05/31/2021 PHYSICAL THERAPY DISCHARGE SUMMARY- non visit discharge  Visits from Start of Care: 2  Current functional level related to goals / functional outcomes: Pt was only able to be treated for 2 visits due to issues with high BP. Had been on hold due to this. Last time he presented to clinic was 9/20 but visit had to be held again. Pt no showed for remaining couple visits. PT had called and left messages at primary MD office twice regarding elevated BP changes and nothing had been changes when last presented. PT will discharge at this time. If BP is addressed so that pt is safe to participate, he could return with new order.   Remaining deficits: Weakness from CVA   Education / Equipment: Initial HEP had been given   Patient agrees to discharge. Patient goals were not met. Patient is being discharged due to a change in medical status.   Electa Sniff, PT, DPT, NCS 05/31/2021, 10:43 AM  [facility] [address] [place], [place], [address] Phone: [phone]   Fax:  [phone]

## 2021-06-05 ENCOUNTER — Ambulatory Visit: Payer: Federal, State, Local not specified - PPO | Admitting: Specialist

## 2021-08-07 ENCOUNTER — Emergency Department (HOSPITAL_COMMUNITY)
Admission: EM | Admit: 2021-08-07 | Discharge: 2021-08-08 | Disposition: A | Discharge status: Home / Self Care | Payer: Federal, State, Local not specified - PPO | Attending: Emergency Medicine | Admitting: Emergency Medicine

## 2021-08-07 ENCOUNTER — Other Ambulatory Visit: Payer: Self-pay

## 2021-08-07 ENCOUNTER — Encounter (HOSPITAL_COMMUNITY): Payer: Self-pay

## 2021-08-07 ENCOUNTER — Emergency Department (HOSPITAL_COMMUNITY): Payer: Federal, State, Local not specified - PPO

## 2021-08-07 DIAGNOSIS — Z7984 Long term (current) use of oral hypoglycemic drugs: Secondary | ICD-10-CM | POA: Insufficient documentation

## 2021-08-07 DIAGNOSIS — Z79899 Other long term (current) drug therapy: Secondary | ICD-10-CM | POA: Insufficient documentation

## 2021-08-07 DIAGNOSIS — R7989 Other specified abnormal findings of blood chemistry: Secondary | ICD-10-CM | POA: Insufficient documentation

## 2021-08-07 DIAGNOSIS — I10 Essential (primary) hypertension: Secondary | ICD-10-CM | POA: Diagnosis not present

## 2021-08-07 DIAGNOSIS — Z7982 Long term (current) use of aspirin: Secondary | ICD-10-CM | POA: Diagnosis not present

## 2021-08-07 DIAGNOSIS — Z794 Long term (current) use of insulin: Secondary | ICD-10-CM

## 2021-08-07 DIAGNOSIS — E162 Hypoglycemia, unspecified: Secondary | ICD-10-CM | POA: Diagnosis present

## 2021-08-07 DIAGNOSIS — Y900 Blood alcohol level of less than 20 mg/100 ml: Secondary | ICD-10-CM | POA: Diagnosis not present

## 2021-08-07 DIAGNOSIS — E11649 Type 2 diabetes mellitus with hypoglycemia without coma: Secondary | ICD-10-CM | POA: Diagnosis not present

## 2021-08-07 DIAGNOSIS — Z8673 Personal history of transient ischemic attack (TIA), and cerebral infarction without residual deficits: Secondary | ICD-10-CM

## 2021-08-07 DIAGNOSIS — R778 Other specified abnormalities of plasma proteins: Secondary | ICD-10-CM

## 2021-08-07 DIAGNOSIS — R600 Localized edema: Secondary | ICD-10-CM | POA: Insufficient documentation

## 2021-08-07 DIAGNOSIS — Z20822 Contact with and (suspected) exposure to covid-19: Secondary | ICD-10-CM | POA: Diagnosis not present

## 2021-08-07 HISTORY — DX: Cerebral infarction, unspecified: I63.9

## 2021-08-07 LAB — COMPREHENSIVE METABOLIC PANEL
ALT: 18 U/L (ref 0–44)
AST: 23 U/L (ref 15–41)
Albumin: 2.7 g/dL — ABNORMAL LOW (ref 3.5–5.0)
Alkaline Phosphatase: 67 U/L (ref 38–126)
Anion gap: 7 (ref 5–15)
BUN: 25 mg/dL — ABNORMAL HIGH (ref 6–20)
CO2: 21 mmol/L — ABNORMAL LOW (ref 22–32)
Calcium: 8.4 mg/dL — ABNORMAL LOW (ref 8.9–10.3)
Chloride: 108 mmol/L (ref 98–111)
Creatinine, Ser: 2.83 mg/dL — ABNORMAL HIGH (ref 0.61–1.24)
GFR, Estimated: 25 mL/min — ABNORMAL LOW (ref >60)
Glucose, Bld: 183 mg/dL — ABNORMAL HIGH (ref 70–99)
Potassium: 4.6 mmol/L (ref 3.5–5.1)
Sodium: 136 mmol/L (ref 135–145)
Total Bilirubin: 0.5 mg/dL (ref 0.3–1.2)
Total Protein: 6.6 g/dL (ref 6.5–8.1)

## 2021-08-07 LAB — LACTIC ACID, PLASMA: Lactic Acid, Venous: 1.7 mmol/L (ref 0.5–1.9)

## 2021-08-07 LAB — I-STAT VENOUS BLOOD GAS, ED
Acid-base deficit: 4 mmol/L — ABNORMAL HIGH (ref 0.0–2.0)
Bicarbonate: 22.8 mmol/L (ref 20.0–28.0)
Calcium, Ion: 1.18 mmol/L (ref 1.15–1.40)
HCT: 32 % — ABNORMAL LOW (ref 39.0–52.0)
Hemoglobin: 10.9 g/dL — ABNORMAL LOW (ref 13.0–17.0)
O2 Saturation: 57 %
Potassium: 4.6 mmol/L (ref 3.5–5.1)
Sodium: 139 mmol/L (ref 135–145)
TCO2: 24 mmol/L (ref 22–32)
pCO2, Ven: 47.6 mmHg (ref 44.0–60.0)
pH, Ven: 7.289 (ref 7.250–7.430)
pO2, Ven: 33 mmHg (ref 32.0–45.0)

## 2021-08-07 LAB — CBC WITH DIFFERENTIAL/PLATELET
Abs Immature Granulocytes: 0.04 10*3/uL (ref 0.00–0.07)
Basophils Absolute: 0 10*3/uL (ref 0.0–0.1)
Basophils Relative: 0 %
Eosinophils Absolute: 0.1 10*3/uL (ref 0.0–0.5)
Eosinophils Relative: 1 %
HCT: 33.5 % — ABNORMAL LOW (ref 39.0–52.0)
Hemoglobin: 10 g/dL — ABNORMAL LOW (ref 13.0–17.0)
Immature Granulocytes: 0 %
Lymphocytes Relative: 15 %
Lymphs Abs: 1.4 10*3/uL (ref 0.7–4.0)
MCH: 26.9 pg (ref 26.0–34.0)
MCHC: 29.9 g/dL — ABNORMAL LOW (ref 30.0–36.0)
MCV: 90.1 fL (ref 80.0–100.0)
Monocytes Absolute: 0.6 10*3/uL (ref 0.1–1.0)
Monocytes Relative: 7 %
Neutro Abs: 7 10*3/uL (ref 1.7–7.7)
Neutrophils Relative %: 77 %
Platelets: 307 10*3/uL (ref 150–400)
RBC: 3.72 MIL/uL — ABNORMAL LOW (ref 4.22–5.81)
RDW: 15.7 % — ABNORMAL HIGH (ref 11.5–15.5)
WBC: 9.2 10*3/uL (ref 4.0–10.5)
nRBC: 0 % (ref 0.0–0.2)

## 2021-08-07 LAB — TROPONIN I (HIGH SENSITIVITY)
Troponin I (High Sensitivity): 74 ng/L — ABNORMAL HIGH (ref <18)
Troponin I (High Sensitivity): 95 ng/L — ABNORMAL HIGH (ref <18)

## 2021-08-07 LAB — RESP PANEL BY RT-PCR (FLU A&B, COVID) ARPGX2
Influenza A by PCR: NEGATIVE
Influenza B by PCR: NEGATIVE
SARS Coronavirus 2 by RT PCR: NEGATIVE

## 2021-08-07 LAB — PROTIME-INR
INR: 1 (ref 0.8–1.2)
Prothrombin Time: 12.9 seconds (ref 11.4–15.2)

## 2021-08-07 LAB — CBG MONITORING, ED
Glucose-Capillary: 81 mg/dL (ref 70–99)
Glucose-Capillary: 65 mg/dL — ABNORMAL LOW (ref 70–99)
Glucose-Capillary: 76 mg/dL (ref 70–99)

## 2021-08-07 LAB — LIPASE, BLOOD: Lipase: 36 U/L (ref 11–51)

## 2021-08-07 LAB — ETHANOL: Alcohol, Ethyl (B): <10 mg/dL (ref <10)

## 2021-08-07 NOTE — Assessment & Plan Note (Addendum)
I do not see any record of ambulatory evaluation for afib after he was discharged.  EDP thinks she may have seen afib on telemetry but this was not captured on EKG.  Suggested to dtr that pt's PCP order a 14 day holter monitor or 14 day Ziopatch to evaluate for afib. Pt may need referral to outpatient cardiology for ambulatory afib evaluation. Pt to continue taking ASA 81 mg day.  If pt discovered to have PAF, he would need systemic anticoagulation. Continue with ASA 81 mg daily.

## 2021-08-07 NOTE — ED Triage Notes (Signed)
Called out by family pt not responding appropriately around 1430 slurred words with a CBG at 37 by fire sugar brought up to 42 after juice and 1 packet of 25 G glucose.  On EMS arrival pt was slurred and lethargic and diaphoretic on their arrival.

## 2021-08-07 NOTE — Discharge Instructions (Signed)
1.  Only take your Lantus at night.  Monitor your blood sugars closely. 2.  Follow-up with your doctor for recheck within the next 2 to 3 days.  Discuss getting a Zio patch for monitoring. 3.  Return to the emergency department if you have any new worsening or concerning symptoms

## 2021-08-07 NOTE — ED Provider Notes (Addendum)
[place] EMERGENCY DEPARTMENT Provider Note   CSN: [id number] Arrival date & time: 08/07/21  1635     History Chief Complaint  Patient presents with   Hypoglycemia    Joshua Case is a 60 y.o. male.  HPI Patient's wife left for work about 845 this morning and patient was at normal baseline.  She returned from work about 1430 and patient was lying on the bed poorly responsive and confused.  EMS was called.  First CBG was by fire department at 37.  They administered some oral glucose 25 g and orange juice blood sugar elevated to 42 and patient was somewhat more responsive but still symptomatic.  EMS arrived and reports a administered D10 15 g.  Patient then returned to normal baseline.  Sugar went up to 170 with recheck.  Blood pressure however remained elevated at 035 systolic for approximately 20 minutes.  With history of stroke and persistently elevated blood pressure, patient was transported for further evaluation.  Patient's blood sugar had gone down to 80 at time of transport.  Patient was also given a sandwich.  He ate this without difficulty.  At this time, patient is denying any symptoms.  He is denying any chest pain headache or other areas of pain.  He does however identify that he started to feel like he had a flulike illness starting over the weekend.  No documented fever.  Reports he just felt a [place] fatigued and started to get some upper respiratory congestion.  He reports this morning he felt [place] fatigued which is why he laid down for a nap which he reports was at 1230.  He cannot recall if he took his morning insulin.  He knows he did not eat anything.  He reports he did not feel bad enough that he thought he needed to take something but did feel like he was "coming down with something".  Patient's wife and EMS were unable to determine if patient had taken his insulin this morning.  Patient reports he is not consistent with what time he takes it.  Reports  sometimes he takes in the morning if he feels like he can eat something but if he does not feel like he is hungry he might take it in the evening.  At this time, he cannot member when he took the last dose.    Past Medical History:  Diagnosis Date   Diabetes mellitus without complication ([place])    Hypertension    Stroke [facility])     Patient Active Problem List   Diagnosis Date Noted   Diabetic hypoglycemia ([place]) 08/07/2021   Hx of arterial ischemic stroke - 04-2021 08/07/2021   Acute CVA (cerebrovascular accident) ([place]) 04/05/2021   Hypertensive emergency 04/05/2021   AKI (acute kidney injury) ([place]) 04/05/2021   Dyspnea on exertion 04/05/2021   Diabetes ([place]) 04/05/2021   HAV (hallux abducto valgus) 10/20/2014   Bunion 10/20/2014   Plantar porokeratosis, acquired 10/20/2014   Lumbar disc disease with radiculopathy 07/13/2013   DIAB W/RENAL MANIFESTS TYPE II/UNS TYPE UNCNTRL 10/15/2010   DYSLIPIDEMIA 10/15/2010   HYPERTENSION, ESSENTIAL, UNCONTROLLED 10/15/2010   MICROALBUMINURIA 10/15/2010   PERS HX NONCOMPLIANCE W/MED TX PRS HAZARDS HLTH 10/15/2010    History reviewed. No pertinent surgical history.     Family History  Problem Relation Age of Onset   Heart disease Mother    Diabetes Father    Hypertension Father    Diabetes Daughter    Hypertension Daughter  Social History   Tobacco Use   Smoking status: Never   Smokeless tobacco: Never  Vaping Use   Vaping Use: Every day  Substance Use Topics   Alcohol use: Yes    Alcohol/week: 0.0 standard drinks   Drug use: Yes    Types: Marijuana    Home Medications Prior to Admission medications   Medication Sig Start Date End Date Taking? Authorizing Provider  amLODipine (NORVASC) 10 MG tablet Take 10 mg by mouth daily. 06/09/12  Yes [provider]  aspirin EC 81 MG tablet Take 1 tablet (81 mg total) by mouth daily. Swallow whole. 04/08/21 04/08/22 Yes Oswald Hillock, MD  gabapentin (NEURONTIN) 300 MG capsule  Take 1 capsule (300 mg total) by mouth at bedtime. 02/15/21  Yes Jessy Oto, MD  glimepiride (AMARYL) 4 MG tablet Take 4 mg by mouth daily. 12/12/20  Yes [provider]  insulin detemir (LEVEMIR) 100 UNIT/ML injection Inject 20 Units into the skin at bedtime.   Yes [provider]  olmesartan (BENICAR) 40 MG tablet Take 40 mg by mouth daily. 07/28/21  Yes [provider]  Phenylephrine-DM-GG-APAP (DELSYM COUGH/COLD DAYTIME) 5-10-200-325 MG/10ML LIQD Take 10 mLs by mouth every 12 (twelve) hours as needed (cough).   Yes [provider]  rosuvastatin (CRESTOR) 20 MG tablet Take 20 mg by mouth daily.   Yes [provider]  Insulin Pen Needle (B-D UF III MINI PEN NEEDLES) 31G X 5 MM MISC 1 each by Does not apply route daily. 03/31/14   Roselee Culver, MD  metoprolol succinate (TOPROL-XL) 100 MG 24 hr tablet Take 100 mg by mouth 2 (two) times daily. Patient not taking: Reported on 05/02/2021 12/12/20   [provider]  oxyCODONE-acetaminophen (PERCOCET) 5-325 MG tablet Take 2 tablets by mouth every 6 (six) hours as needed for severe pain. Patient not taking: Reported on 08/07/2021 08/19/18   Jessy Oto, MD  valsartan (DIOVAN) 160 MG tablet Take 1 tablet (160 mg total) by mouth daily. Patient not taking: Reported on 05/02/2021 04/08/21 04/08/22  Oswald Hillock, MD    Allergies    Victoza [liraglutide]  Review of Systems   Review of Systems 10 systems reviewed negative except for HPI Physical Exam Updated Vital Signs BP (!) 149/83   Pulse 79   Temp 98.8 F (37.1 C) (Oral)   Resp (!) 21   Ht 6\' 3"  (1.905 m)   Wt 136.1 kg   SpO2 98%   BMI 37.50 kg/m   Physical Exam Constitutional:      Comments: Patient is awake and alert.  No respiratory distress at rest.  HENT:     Mouth/Throat:     Mouth: Mucous membranes are moist.     Pharynx: Oropharynx is clear.  Eyes:     Extraocular Movements: Extraocular movements intact.     Pupils:  Pupils are equal, round, and reactive to light.  Cardiovascular:     Rate and Rhythm: Normal rate and regular rhythm.  Pulmonary:     Effort: Pulmonary effort is normal.     Comments: Breath sounds grossly clear there are some expiratory wheeze at the bases. Abdominal:     Comments: Protuberant abdomen soft and nontender  Musculoskeletal:     Cervical back: Neck supple.     Comments: 1-2+ edema bilateral lower extremities.  Skin thinning and some hyperpigmentation consistent with chronic venous stasis.  No active wounds.  Feet do not have any active wounds or areas of  cellulitis toenail thickening but general condition of feet and lower legs do not suggest infection or cellulitis.  Skin:    General: Skin is warm and dry.  Neurological:     Comments: Patient is alert and oriented.  His speech seems mildly slow but this is reportedly his baseline.  He follows commands appropriately.  No focal motor deficits.    ED Results / Procedures / Treatments   Labs (all labs ordered are listed, but only abnormal results are displayed) Labs Reviewed  COMPREHENSIVE METABOLIC PANEL - Abnormal; Notable for the following components:      Result Value   CO2 21 (*)    Glucose, Bld 183 (*)    BUN 25 (*)    Creatinine, Ser 2.83 (*)    Calcium 8.4 (*)    Albumin 2.7 (*)    GFR, Estimated 25 (*)    All other components within normal limits  CBC WITH DIFFERENTIAL/PLATELET - Abnormal; Notable for the following components:   RBC 3.72 (*)    Hemoglobin 10.0 (*)    HCT 33.5 (*)    MCHC 29.9 (*)    RDW 15.7 (*)    All other components within normal limits  I-STAT VENOUS BLOOD GAS, ED - Abnormal; Notable for the following components:   Acid-base deficit 4.0 (*)    HCT 32.0 (*)    Hemoglobin 10.9 (*)    All other components within normal limits  CBG MONITORING, ED - Abnormal; Notable for the following components:   Glucose-Capillary 65 (*)    All other components within normal limits  TROPONIN I (HIGH  SENSITIVITY) - Abnormal; Notable for the following components:   Troponin I (High Sensitivity) 74 (*)    All other components within normal limits  TROPONIN I (HIGH SENSITIVITY) - Abnormal; Notable for the following components:   Troponin I (High Sensitivity) 95 (*)    All other components within normal limits  RESP PANEL BY RT-PCR (FLU A&B, COVID) ARPGX2  ETHANOL  LACTIC ACID, PLASMA  LIPASE, BLOOD  PROTIME-INR  URINALYSIS, ROUTINE W REFLEX MICROSCOPIC  RAPID URINE DRUG SCREEN, HOSP PERFORMED  CBG MONITORING, ED  CBG MONITORING, ED  CBG MONITORING, ED    EKG EKG Interpretation  Date/Time:  Wednesday August 07 2021 16:55:18 EST Ventricular Rate:  88 PR Interval:  199 QRS Duration: 108 QT Interval:  391 QTC Calculation: 474 R Axis:   -2 Text Interpretation: Sinus rhythm Anterior infarct, old repolarization abnormality present on previous tracing. no significant change Confirmed by Charlesetta Shanks [phone]) on 08/07/2021 6:35:50 PM  Radiology DG Chest Port 1 View  Result Date: 08/07/2021 CLINICAL DATA:  Cough EXAM: PORTABLE CHEST 1 VIEW COMPARISON:  Chest x-ray 04/07/2021 FINDINGS: Enlarged cardiac silhouette. The heart and mediastinal contours are unchanged. Slightly prominent hilar vasculature. No focal consolidation. Increased interstitial markings. No pleural effusion. No pneumothorax. No acute osseous abnormality. IMPRESSION: Persistent enlarged cardiac silhouette with mild pulmonary edema. Electronically Signed   By: Iven Finn M.D.   On: 08/07/2021 17:08    Procedures Procedures  CRITICAL CARE Performed by: Charlesetta Shanks   Medications Ordered in ED Medications - No data to display  ED Course  I have reviewed the triage vital signs and the nursing notes.  Pertinent labs & imaging results that were available during my care of the patient were reviewed by me and considered in my medical decision making (see chart for details).    MDM Rules/Calculators/A&P  Consult: Reviewed with Dr. Kristopher Oppenheim for admission.  Patient presents as outlined with third mental status identified by his wife at about 2:30 in the afternoon.  Patient has history of recent CVA in August.  Patient was found to be significantly hypoglycemic by EMS.  Unclear if patient had taken insulin.  He did not recall.  Able to identify with the patient's medications and review whether or not today's insulin was administered.  Diagnostic evaluation initiated with EKG, blood work and x-rays.  Recurrent CBG monitoring and mental status checks.  Patient's troponin returned mildly elevated.  Repeat troponin slight increase.  Patient denies any chest pain.  Saturations remained stable. Initially, I observed atrial fibrillation which appeared new, however this was clarified. I transposed monitor reading with patient in adjacent room and review of rhythm for this patient doe not show atrial fibrillation.  Dr. Bridgett Larsson has done consult.  He has reviewed a follow-up plan with the patient.  He does advised patient needs a Zio patch which will be addressed with the patient's PCP.  Patient also had been using his Lantus both at night and in morning accounting for concerns of hypoglycemia.  Will use only evening Lantus.  Close follow-up is recommended return precautions reviewed. Final Clinical Impression(s) / ED Diagnoses Final diagnoses:  Hypoglycemia  Troponin I above reference range    Rx / DC Orders ED Discharge Orders     None        Charlesetta Shanks, MD 08/07/21 [id number]    Charlesetta Shanks, MD 08/07/21 2326

## 2021-08-07 NOTE — Consult Note (Signed)
Hospitalist Consultation History and Physical    Joshua Case WFU:[id number] DOB: 1960-09-17 DOA: 08/07/2021  PCP: Lin Landsman, MD   Patient coming from: Home  I have personally briefly reviewed patient's old medical records in [place] Requesting provider: Johnney Killian, MD Reason for consult: hypoglycemia CC: hypoglycemia HPI: 60 yo AAM with hx of type 2 DM, HTN, ischemic stroke in 04-2021 that presented to ER today with hyperglycemia.  Patient noted to have blood sugar of 37 by EMS.  Family states that they found the patient at home unresponsive approximately 230.  Patient was given orange juice and 25 g of IV glucose by EMS.  This did bring her sugar up to 42 however after he arrived to the ER, his blood sugar fell again to 65 requiring more oral glucose to be given.  Patient was admitted to the hospital in August 2022 for ischemic stroke.  At that time patient was discharged home on 20 units of Levemir.  In discussing with the patient and his daughter [place] at the bedside, the patient has been taking 20 units of Levemir at night and 30 units of Levemir in the morning.  I have reviewed the patient's discharge summary and discharge orders and in fact the patient was discharged on 20 units of Levemir.  Patient has a PCP that is not associate with [facility] and I did not see any documentation about the patient's insulin dosing.  Patient is actually feeling better.  Last blood sugar was 76.  He is actually eaten and feeling better wants to go home.  Reportedly the EDP has seen some irregular heart rhythms on telemetry.  She thinks that this may have been atrial fibrillation.  EKG was ordered but only showed normal sinus rhythm.  In reviewing the patient's chart, I do not see any mention of an outpatient Holter or Zio patch to evaluate for amatory paroxysmal atrial fibrillation.   ED Course: pt required multiple doses of glucose to stabilize CBG.  Review of Systems:  Review  of Systems  Constitutional: Negative.   HENT: Negative.    Eyes: Negative.   Respiratory: Negative.    Cardiovascular: Negative.   Gastrointestinal: Negative.   Genitourinary: Negative.   Musculoskeletal: Negative.   Skin: Negative.   Neurological:        Confusion, unresponsive at home.  Endo/Heme/Allergies:        CBG was 37 by EMS. Required multiple doses of glucose in order to raise CBG. Pt has been taking levemir BID. DC orders from 04-2021 were only for qday levemir.  Psychiatric/Behavioral: Negative.    All other systems reviewed and are negative.  Past Medical History:  Diagnosis Date   Diabetes mellitus without complication ([place])    Hypertension    Stroke [facility])     History reviewed. No pertinent surgical history.   reports that he has never smoked. He has never used smokeless tobacco. He reports current alcohol use. He reports current drug use. Drug: Marijuana.  Allergies  Allergen Reactions   Victoza [Liraglutide] Swelling    Family History  Problem Relation Age of Onset   Heart disease Mother    Diabetes Father    Hypertension Father    Diabetes Daughter    Hypertension Daughter     Prior to Admission medications   Medication Sig Start Date End Date Taking? Authorizing Provider  amLODipine (NORVASC) 10 MG tablet Take 10 mg by mouth daily. 06/09/12  Yes [provider]  aspirin EC 81 MG  tablet Take 1 tablet (81 mg total) by mouth daily. Swallow whole. 04/08/21 04/08/22 Yes Oswald Hillock, MD  gabapentin (NEURONTIN) 300 MG capsule Take 1 capsule (300 mg total) by mouth at bedtime. 02/15/21  Yes Jessy Oto, MD  glimepiride (AMARYL) 4 MG tablet Take 4 mg by mouth daily. 12/12/20  Yes [provider]  insulin detemir (LEVEMIR) 100 UNIT/ML injection Inject 20 Units into the skin at bedtime.   Yes [provider]  olmesartan (BENICAR) 40 MG tablet Take 40 mg by mouth daily. 07/28/21  Yes [provider]  Phenylephrine-DM-GG-APAP  (DELSYM COUGH/COLD DAYTIME) 5-10-200-325 MG/10ML LIQD Take 10 mLs by mouth every 12 (twelve) hours as needed (cough).   Yes [provider]  rosuvastatin (CRESTOR) 20 MG tablet Take 20 mg by mouth daily.   Yes [provider]  Insulin Pen Needle (B-D UF III MINI PEN NEEDLES) 31G X 5 MM MISC 1 each by Does not apply route daily. 03/31/14   Roselee Culver, MD  metoprolol succinate (TOPROL-XL) 100 MG 24 hr tablet Take 100 mg by mouth 2 (two) times daily. Patient not taking: Reported on 05/02/2021 12/12/20   [provider]  oxyCODONE-acetaminophen (PERCOCET) 5-325 MG tablet Take 2 tablets by mouth every 6 (six) hours as needed for severe pain. Patient not taking: Reported on 08/07/2021 08/19/18   Jessy Oto, MD  valsartan (DIOVAN) 160 MG tablet Take 1 tablet (160 mg total) by mouth daily. Patient not taking: Reported on 05/02/2021 04/08/21 04/08/22  Oswald Hillock, MD    Physical Exam: Vitals:   08/07/21 1800 08/07/21 1815 08/07/21 1830 08/07/21 2030  BP: (!) 173/89 (!) 163/83 (!) 166/104 (!) 149/83  Pulse: 78 73 77 79  Resp: 16 10 12  (!) 21  Temp:      TempSrc:      SpO2: 100% 100% 100% 98%  Weight:      Height:        Physical Exam Vitals and nursing note reviewed.  Constitutional:      General: He is not in acute distress.    Appearance: He is obese. He is not ill-appearing, toxic-appearing or diaphoretic.  HENT:     Head: Normocephalic and atraumatic.     Nose: Nose normal. No rhinorrhea.  Eyes:     General:        Right eye: No discharge.        Left eye: No discharge.  Cardiovascular:     Rate and Rhythm: Normal rate and regular rhythm.     Pulses: Normal pulses.     Heart sounds: No murmur heard.   No gallop.  Pulmonary:     Effort: Pulmonary effort is normal. No respiratory distress.     Breath sounds: No wheezing or rales.  Abdominal:     General: Abdomen is flat. Bowel sounds are normal. There is no distension.     Tenderness: There is no  abdominal tenderness. There is no guarding or rebound.  Skin:    General: Skin is warm and dry.     Capillary Refill: Capillary refill takes less than 2 seconds.  Neurological:     General: No focal deficit present.     Mental Status: He is alert and oriented to person, place, and time.     Labs on Admission: I have personally reviewed following labs and imaging studies  CBC: Recent Labs  Lab 08/07/21 1656 08/07/21 1712  WBC 9.2  --   NEUTROABS 7.0  --  HGB 10.0* 10.9*  HCT 33.5* 32.0*  MCV 90.1  --   PLT 307  --    Basic Metabolic Panel: Recent Labs  Lab 08/07/21 1656 08/07/21 1712  NA 136 139  K 4.6 4.6  CL 108  --   CO2 21*  --   GLUCOSE 183*  --   BUN 25*  --   CREATININE 2.83*  --   CALCIUM 8.4*  --    GFR: Estimated Creatinine Clearance: 42.8 mL/min (A) (by C-G formula based on SCr of 2.83 mg/dL (H)). Liver Function Tests: Recent Labs  Lab 08/07/21 1656  AST 23  ALT 18  ALKPHOS 67  BILITOT 0.5  PROT 6.6  ALBUMIN 2.7*   Recent Labs  Lab 08/07/21 1656  LIPASE 36   No results for input(s): AMMONIA in the last 168 hours. Coagulation Profile: Recent Labs  Lab 08/07/21 1656  INR 1.0   Cardiac Enzymes: No results for input(s): CKTOTAL, CKMB, CKMBINDEX, TROPONINI in the last 168 hours. BNP (last 3 results) No results for input(s): PROBNP in the last 8760 hours. HbA1C: No results for input(s): HGBA1C in the last 72 hours. CBG: Recent Labs  Lab 08/07/21 1637 08/07/21 1858 08/07/21 2046  GLUCAP 81 65* 76   Lipid Profile: No results for input(s): CHOL, HDL, LDLCALC, TRIG, CHOLHDL, LDLDIRECT in the last 72 hours. Thyroid Function Tests: No results for input(s): TSH, T4TOTAL, FREET4, T3FREE, THYROIDAB in the last 72 hours. Anemia Panel: No results for input(s): VITAMINB12, FOLATE, FERRITIN, TIBC, IRON, RETICCTPCT in the last 72 hours. Urine analysis:    Component Value Date/Time   COLORURINE YELLOW 04/05/2021 1925   APPEARANCEUR CLOUDY  (A) 04/05/2021 1925   LABSPEC 1.018 04/05/2021 1925   PHURINE 5.0 04/05/2021 1925   GLUCOSEU NEGATIVE 04/05/2021 1925   HGBUR SMALL (A) 04/05/2021 1925   HGBUR Trace 10/15/2010 1343   BILIRUBINUR NEGATIVE 04/05/2021 1925   BILIRUBINUR neg 03/31/2014 1134   KETONESUR NEGATIVE 04/05/2021 1925   PROTEINUR >=300 (A) 04/05/2021 1925   UROBILINOGEN 0.2 03/31/2014 1134   UROBILINOGEN 0.2 10/24/2013 2151   NITRITE NEGATIVE 04/05/2021 1925   LEUKOCYTESUR TRACE (A) 04/05/2021 1925    Radiological Exams on Admission: I have personally reviewed images DG Chest Port 1 View  Result Date: 08/07/2021 CLINICAL DATA:  Cough EXAM: PORTABLE CHEST 1 VIEW COMPARISON:  Chest x-ray 04/07/2021 FINDINGS: Enlarged cardiac silhouette. The heart and mediastinal contours are unchanged. Slightly prominent hilar vasculature. No focal consolidation. Increased interstitial markings. No pleural effusion. No pneumothorax. No acute osseous abnormality. IMPRESSION: Persistent enlarged cardiac silhouette with mild pulmonary edema. Electronically Signed   By: Iven Finn M.D.   On: 08/07/2021 17:08    EKG: I have personally reviewed EKG: NSR    Assessment/Plan Principal Problem:   Diabetic hypoglycemia (HCC) Active Problems:   Hx of arterial ischemic stroke - 04-2021    Diabetic hypoglycemia ([place]) Given severity of pt's hypoglycemia today and continued hypoglycemia after being treated with glucose, pt is likely taking too much insulin. Pt's discharge orders from hospital admission in 04-2021 clearly states that pt should only be on 20 units of levemir per day. Pt has been taking 20 units qhs and 30 units Qam.  Likely pt took AM dose of levemir today and did not eat breakfast or lunch.....ultimately causing his hypoglycemia. Pt should stop taking AM dose of levemir. Pt has also not been checking his BS on a regular basis. I have suggested to pt and his dtr that pt obtain  a continuous glucose monitor (CGM) Rx from his PCP  so that he can check his BS frequently. Pt and dtr instructed for patient to keep a journal of his dietary intake, his pre and post prandial glucose levels(prior to meals and 1 hour after meals) for at least 2 weeks. Then bring results to his PCP for DM med adjustments.  Hx of arterial ischemic stroke - 04-2021 I do not see any record of ambulatory evaluation for afib after he was discharged.  EDP thinks she may have seen afib on telemetry but this was not captured on EKG.  Suggested to dtr that pt's PCP order a 14 day holter monitor or 14 day Ziopatch to evaluate for afib. Pt may need referral to outpatient cardiology for ambulatory afib evaluation. Pt to continue taking ASA 81 mg day.  If pt discovered to have PAF, he would need systemic anticoagulation.   Kristopher Oppenheim, DO [facility] 08/07/2021, 11:29 PM

## 2021-08-07 NOTE — Assessment & Plan Note (Addendum)
Given severity of pt's hypoglycemia today and continued hypoglycemia after being treated with glucose, pt is likely taking too much insulin. Pt's discharge orders from hospital admission in 04-2021 clearly states that pt should only be on 20 units of levemir per day. Pt has been taking 20 units qhs and 30 units Qam.  Likely pt took AM dose of levemir today and did not eat breakfast or lunch.....ultimately causing his hypoglycemia. Pt should stop taking AM dose of levemir. Pt has also not been checking his BS on a regular basis. I have suggested to pt and his dtr that pt obtain a continuous glucose monitor (CGM) Rx from his PCP so that he can check his BS frequently. Pt and dtr instructed for patient to keep a journal of his dietary intake, his pre and post prandial glucose levels(prior to meals and 1 hour after meals) for at least 2 weeks. Then bring results to his PCP for DM med adjustments.  At this point, the patient is medically stable for discharge.  Discussed the patient's discharge instructions with both the patient and his daughter.  Also discussed with the patient's ER provider.

## 2021-08-07 NOTE — ED Notes (Addendum)
Patient is resting comfortably. Sandwich, crackers and PB given d/t recent BG reading of 65. Pt asymptomatic

## 2021-08-07 NOTE — H&P (Incomplete)
History and Physical    Joshua Case RCB:[id number] DOB: 1960/09/28 DOA: 08/07/2021  PCP: Lin Landsman, MD   Patient coming from: {Blank single:[id number]::"Home","Clinic","SNF","ALF","Group Home","Hospice","***"}  I have personally briefly reviewed patient's old medical records in [place]  CC: hypoglycemia HPI: 60 yo AAM with hx of type 2 DM, HTN, ischemic stroke in 04-2021 that presented to ER today with hyperglycemia.  Patient noted to have blood sugar of 37 by EMS.  Family states that they found the patient at home unresponsive approximately 230.  Patient was given orange juice and 25 g of IV glucose by EMS.  This did bring her sugar up to 42 however after he arrived to the ER, his blood sugar fell again to 65 requiring more oral glucose to be given.  Patient was admitted to the hospital in August 2022 for ischemic stroke.  At that time patient was discharged home on 20 units of Levemir.  In discussing with the patient and his daughter Joshua Case at the bedside, the patient has been taking 20 units of Levemir at night and 30 units of Levemir in the morning.  I have reviewed the patient's discharge summary and discharge orders and in fact the patient was discharged on 20 units of Levemir.  Patient has a PCP that is not associate with [place] and I did not see any documentation about the patient's insulin dosing.  Patient is actually feeling better.  Last blood sugar was 76.  He is actually eaten and feeling better wants to go home.  Reportedly the EDP has seen some irregular heart rhythms on telemetry.  She thinks that this may have been atrial fibrillation.  EKG was ordered but only showed normal sinus rhythm.  In reviewing the patient's chart, I do not see any mention of an outpatient Holter or Zio patch to evaluate for amatory paroxysmal atrial fibrillation.   ED Course: ***  Review of Systems:  Review of Systems  Constitutional: Negative.   HENT: Negative.    Eyes:  Negative.   Respiratory: Negative.    Cardiovascular: Negative.   Gastrointestinal: Negative.   Genitourinary: Negative.   Musculoskeletal: Negative.   Skin: Negative.   Neurological:        Confusion, unresponsive at home.  Endo/Heme/Allergies:        CBG was 37 by EMS. Required multiple doses of glucose in order to raise CBG. Pt has been taking levemir BID. DC orders from 04-2021 were only for qday levemir.  Psychiatric/Behavioral: Negative.    All other systems reviewed and are negative.  Past Medical History:  Diagnosis Date   Diabetes mellitus without complication ([place])    Hypertension    Stroke [facility])     History reviewed. No pertinent surgical history.   reports that he has never smoked. He has never used smokeless tobacco. He reports current alcohol use. He reports current drug use. Drug: Marijuana.  Allergies  Allergen Reactions   Victoza [Liraglutide] Swelling    Family History  Problem Relation Age of Onset   Heart disease Mother    Diabetes Father    Hypertension Father    Diabetes Daughter    Hypertension Daughter     Prior to Admission medications   Medication Sig Start Date End Date Taking? Authorizing Provider  amLODipine (NORVASC) 10 MG tablet Take 10 mg by mouth daily. 06/09/12  Yes [provider]  aspirin EC 81 MG tablet Take 1 tablet (81 mg total) by mouth daily. Swallow whole. 04/08/21 04/08/22 Yes  Oswald Hillock, MD  gabapentin (NEURONTIN) 300 MG capsule Take 1 capsule (300 mg total) by mouth at bedtime. 02/15/21  Yes Jessy Oto, MD  glimepiride (AMARYL) 4 MG tablet Take 4 mg by mouth daily. 12/12/20  Yes [provider]  insulin detemir (LEVEMIR) 100 UNIT/ML injection Inject 20 Units into the skin at bedtime.   Yes [provider]  olmesartan (BENICAR) 40 MG tablet Take 40 mg by mouth daily. 07/28/21  Yes [provider]  Phenylephrine-DM-GG-APAP (DELSYM COUGH/COLD DAYTIME) 5-10-200-325 MG/10ML LIQD Take 10 mLs by  mouth every 12 (twelve) hours as needed (cough).   Yes [provider]  rosuvastatin (CRESTOR) 20 MG tablet Take 20 mg by mouth daily.   Yes [provider]  Insulin Pen Needle (B-D UF III MINI PEN NEEDLES) 31G X 5 MM MISC 1 each by Does not apply route daily. 03/31/14   Roselee Culver, MD  metoprolol succinate (TOPROL-XL) 100 MG 24 hr tablet Take 100 mg by mouth 2 (two) times daily. Patient not taking: Reported on 05/02/2021 12/12/20   [provider]  oxyCODONE-acetaminophen (PERCOCET) 5-325 MG tablet Take 2 tablets by mouth every 6 (six) hours as needed for severe pain. Patient not taking: Reported on 08/07/2021 08/19/18   Jessy Oto, MD  valsartan (DIOVAN) 160 MG tablet Take 1 tablet (160 mg total) by mouth daily. Patient not taking: Reported on 05/02/2021 04/08/21 04/08/22  Oswald Hillock, MD    Physical Exam: Vitals:   08/07/21 1800 08/07/21 1815 08/07/21 1830 08/07/21 2030  BP: (!) 173/89 (!) 163/83 (!) 166/104 (!) 149/83  Pulse: 78 73 77 79  Resp: 16 10 12  (!) 21  Temp:      TempSrc:      SpO2: 100% 100% 100% 98%  Weight:      Height:        Physical Exam Vitals and nursing note reviewed.  Constitutional:      General: He is not in acute distress.    Appearance: He is obese. He is not ill-appearing, toxic-appearing or diaphoretic.  HENT:     Head: Normocephalic and atraumatic.     Nose: Nose normal. No rhinorrhea.  Eyes:     General:        Right eye: No discharge.        Left eye: No discharge.  Cardiovascular:     Rate and Rhythm: Normal rate and regular rhythm.     Pulses: Normal pulses.     Heart sounds: No murmur heard.   No gallop.  Pulmonary:     Effort: Pulmonary effort is normal. No respiratory distress.     Breath sounds: No wheezing or rales.  Abdominal:     General: Abdomen is flat. Bowel sounds are normal. There is no distension.     Tenderness: There is no abdominal tenderness. There is no guarding or rebound.  Skin:     General: Skin is warm and dry.     Capillary Refill: Capillary refill takes less than 2 seconds.  Neurological:     General: No focal deficit present.     Mental Status: He is alert and oriented to person, place, and time.     Labs on Admission: I have personally reviewed following labs and imaging studies  CBC: Recent Labs  Lab 08/07/21 1656 08/07/21 1712  WBC 9.2  --   NEUTROABS 7.0  --   HGB 10.0* 10.9*  HCT 33.5* 32.0*  MCV 90.1  --  PLT 307  --    Basic Metabolic Panel: Recent Labs  Lab 08/07/21 1656 08/07/21 1712  NA 136 139  K 4.6 4.6  CL 108  --   CO2 21*  --   GLUCOSE 183*  --   BUN 25*  --   CREATININE 2.83*  --   CALCIUM 8.4*  --    GFR: Estimated Creatinine Clearance: 42.8 mL/min (A) (by C-G formula based on SCr of 2.83 mg/dL (H)). Liver Function Tests: Recent Labs  Lab 08/07/21 1656  AST 23  ALT 18  ALKPHOS 67  BILITOT 0.5  PROT 6.6  ALBUMIN 2.7*   Recent Labs  Lab 08/07/21 1656  LIPASE 36   No results for input(s): AMMONIA in the last 168 hours. Coagulation Profile: Recent Labs  Lab 08/07/21 1656  INR 1.0   Cardiac Enzymes: No results for input(s): CKTOTAL, CKMB, CKMBINDEX, TROPONINI in the last 168 hours. BNP (last 3 results) No results for input(s): PROBNP in the last 8760 hours. HbA1C: No results for input(s): HGBA1C in the last 72 hours. CBG: Recent Labs  Lab 08/07/21 1637 08/07/21 1858 08/07/21 2046  GLUCAP 81 65* 76   Lipid Profile: No results for input(s): CHOL, HDL, LDLCALC, TRIG, CHOLHDL, LDLDIRECT in the last 72 hours. Thyroid Function Tests: No results for input(s): TSH, T4TOTAL, FREET4, T3FREE, THYROIDAB in the last 72 hours. Anemia Panel: No results for input(s): VITAMINB12, FOLATE, FERRITIN, TIBC, IRON, RETICCTPCT in the last 72 hours. Urine analysis:    Component Value Date/Time   COLORURINE YELLOW 04/05/2021 1925   APPEARANCEUR CLOUDY (A) 04/05/2021 1925   LABSPEC 1.018 04/05/2021 1925   PHURINE 5.0  04/05/2021 1925   GLUCOSEU NEGATIVE 04/05/2021 1925   HGBUR SMALL (A) 04/05/2021 1925   HGBUR Trace 10/15/2010 1343   BILIRUBINUR NEGATIVE 04/05/2021 1925   BILIRUBINUR neg 03/31/2014 1134   KETONESUR NEGATIVE 04/05/2021 1925   PROTEINUR >=300 (A) 04/05/2021 1925   UROBILINOGEN 0.2 03/31/2014 1134   UROBILINOGEN 0.2 10/24/2013 2151   NITRITE NEGATIVE 04/05/2021 1925   LEUKOCYTESUR TRACE (A) 04/05/2021 1925    Radiological Exams on Admission: I have personally reviewed images DG Chest Port 1 View  Result Date: 08/07/2021 CLINICAL DATA:  Cough EXAM: PORTABLE CHEST 1 VIEW COMPARISON:  Chest x-ray 04/07/2021 FINDINGS: Enlarged cardiac silhouette. The heart and mediastinal contours are unchanged. Slightly prominent hilar vasculature. No focal consolidation. Increased interstitial markings. No pleural effusion. No pneumothorax. No acute osseous abnormality. IMPRESSION: Persistent enlarged cardiac silhouette with mild pulmonary edema. Electronically Signed   By: Iven Finn M.D.   On: 08/07/2021 17:08    EKG: I have personally reviewed EKG: NSR    Assessment/Plan Principal Problem:   Diabetic hypoglycemia (HCC) Active Problems:   Hx of arterial ischemic stroke - 04-2021    Diabetic hypoglycemia ([place]) Given severity of pt's hypoglycemia today and continued hypoglycemia after being treated with glucose, pt is likely taking too much insulin. Pt's discharge orders from hospital admission in 04-2021 clearly states that pt should only be on 20 units of levemir per day. Pt has been taking 20 units qhs and 30 units Qam.  Likely pt took AM dose of levemir today and did not eat breakfast or lunch.....ultimately causing his hypoglycemia. Pt should stop taking AM dose of levemir. Pt has also not been checking his BS on a regular basis. I have suggested to pt and his dtr that pt obtain a continuous glucose monitor (CGM) Rx from his PCP so that he can check  his BS frequently. Pt and dtr instructed for  patient to keep a journal of his dietary intake, his pre and post prandial glucose levels(prior to meals and 1 hour after meals) for at least 2 weeks. Then bring results to his PCP for DM med adjustments.  Hx of arterial ischemic stroke - 04-2021 I do not see any record of ambulatory evaluation for afib after he was discharged.  EDP thinks she may have seen afib on telemetry but this was not captured on EKG.  Suggested to dtr that pt's PCP order a 14 day holter monitor or 14 day Ziopatch to evaluate for afib. Pt may need referral to outpatient cardiology for ambulatory afib evaluation. Pt to continue taking ASA 81 mg day.  If pt discovered to have PAF, he would need systemic anticoagulation.   Kristopher Oppenheim, DO [facility] 08/07/2021, 11:29 PM

## 2021-08-07 NOTE — Subjective & Objective (Signed)
CC: hypoglycemia HPI: 60 yo AAM with hx of type 2 DM, HTN, ischemic stroke in 04-2021 that presented to ER today with hyperglycemia.  Patient noted to have blood sugar of 37 by EMS.  Family states that they found the patient at home unresponsive approximately 230.  Patient was given orange juice and 25 g of IV glucose by EMS.  This did bring her sugar up to 42 however after he arrived to the ER, his blood sugar fell again to 65 requiring more oral glucose to be given.  Patient was admitted to the hospital in August 2022 for ischemic stroke.  At that time patient was discharged home on 20 units of Levemir.  In discussing with the patient and his daughter [place] at the bedside, the patient has been taking 20 units of Levemir at night and 30 units of Levemir in the morning.  I have reviewed the patient's discharge summary and discharge orders and in fact the patient was discharged on 20 units of Levemir.  Patient has a PCP that is not associate with [place] and I did not see any documentation about the patient's insulin dosing.  Patient is actually feeling better.  Last blood sugar was 76.  He is actually eaten and feeling better wants to go home.  Reportedly the EDP has seen some irregular heart rhythms on telemetry.  She thinks that this may have been atrial fibrillation.  EKG was ordered but only showed normal sinus rhythm.  In reviewing the patient's chart, I do not see any mention of an outpatient Holter or Zio patch to evaluate for amatory paroxysmal atrial fibrillation.

## 2021-08-18 IMAGING — MR MR HEAD W/O CM
12 of 13 series · 44 of 48 positions shown · non-contrast
Comparison: None.

CLINICAL DATA: Right-sided weakness

EXAM:
MRI HEAD WITHOUT CONTRAST
TECHNIQUE: Multiplanar, multiecho pulse sequences of the brain and surrounding
structures were obtained without intravenous contrast.

[Series 5: DWI · axial · 3.0mm · 0.92mm/px · z∈[-25,+113]mm · 8 of 96 slices shown (1 of 4)]
[im 1/96]
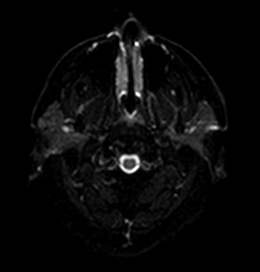
[im 14/96]
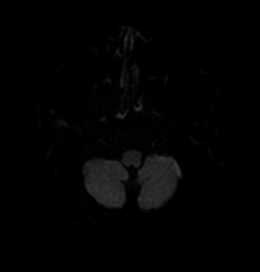
[im 28/96]
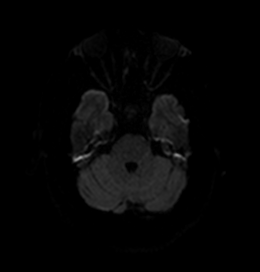
[im 41/96]
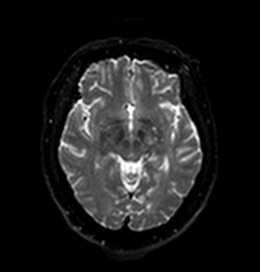
[im 55/96]
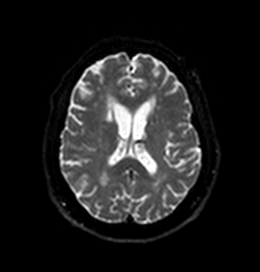
[im 68/96]
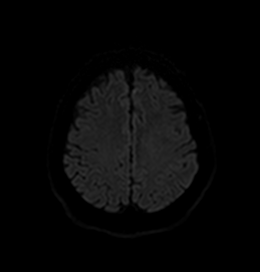
[im 82/96]
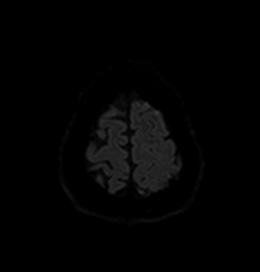
[im 96/96]
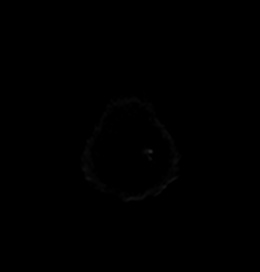

[Series 6: DWI · axial · 3.0mm · 0.92mm/px · z∈[-25,+113]mm · 4 of 48 slices shown (2 of 4)]
[im 1/48]
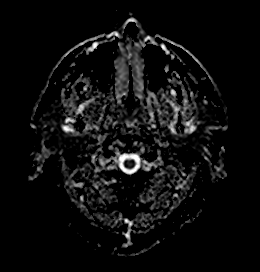
[im 16/48]
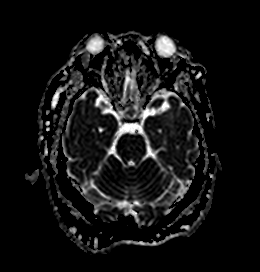
[im 32/48]
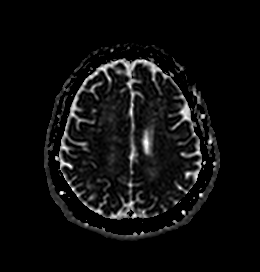
[im 48/48]
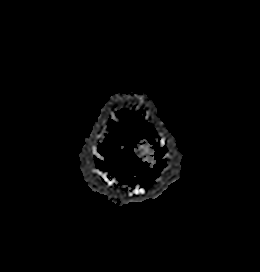

[Series 7: DWI · coronal · 4.0mm · 0.88mm/px · 5 of 72 slices shown (3 of 4)]
[im 1/72]
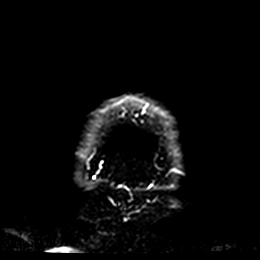
[im 18/72]
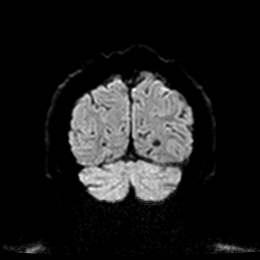
[im 36/72]
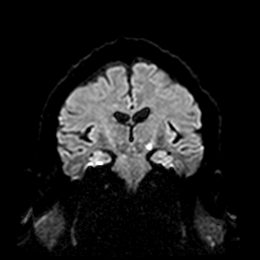
[im 54/72]
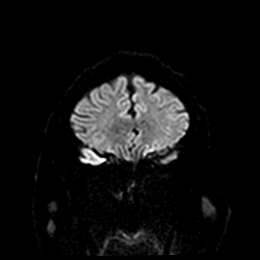
[im 72/72]
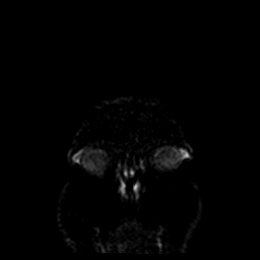

[Series 8: DWI · coronal · 4.0mm · 0.88mm/px · 3 of 36 slices shown (4 of 4)]
[im 1/36]
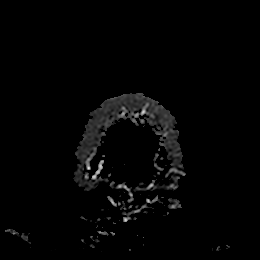
[im 18/36]
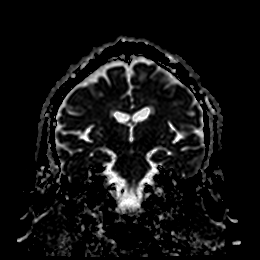
[im 36/36]
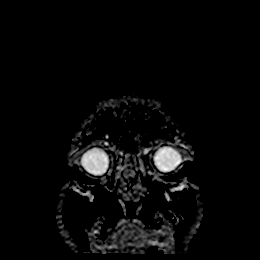

[Series 9: FLAIR · axial · 5.0mm · 0.45mm/px · z∈[-27,+114]mm · 2 of 25 slices shown]
[im 1/25]
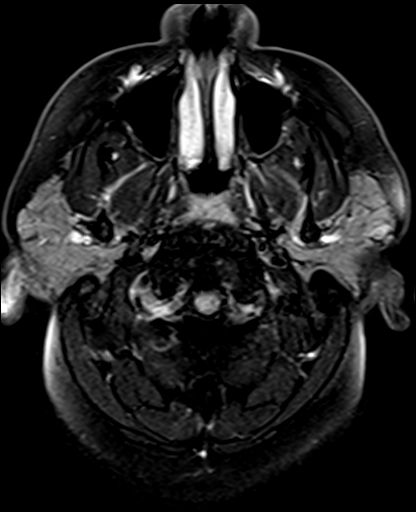
[im 25/25]
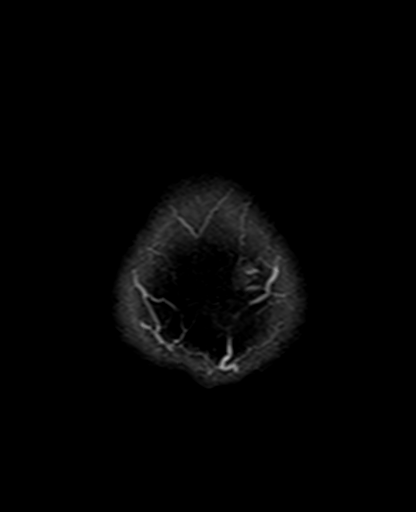

[Series 10: mag_images · axial · 3.0mm · 0.94mm/px · z∈[-42,+121]mm · 4 of 56 slices shown]
[im 1/56]
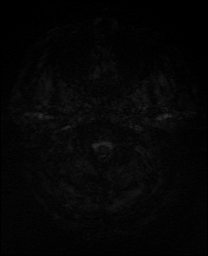
[im 19/56]
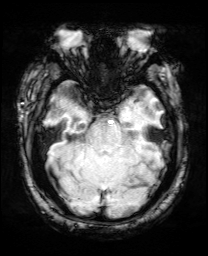
[im 37/56]
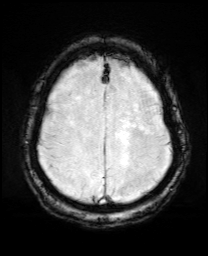
[im 56/56]
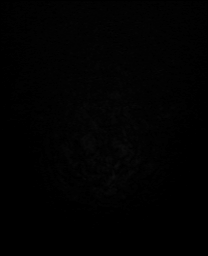

[Series 11: pha_images · axial · 3.0mm · 0.94mm/px · z∈[-42,+118]mm · 4 of 55 slices shown]
[im 1/55]
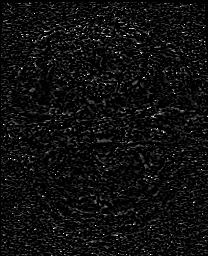
[im 19/55]
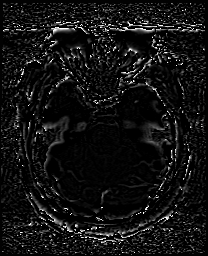
[im 37/55]
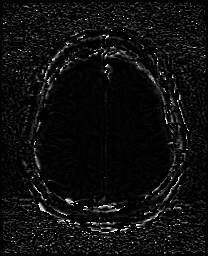
[im 55/55]
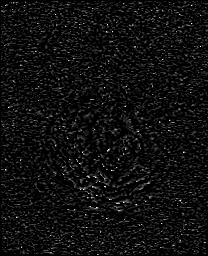

[Series 12: swi_images · axial · 3.0mm · 0.94mm/px · z∈[-42,+121]mm · 4 of 56 slices shown]
[im 1/56]
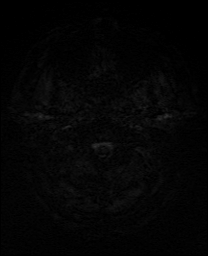
[im 19/56]
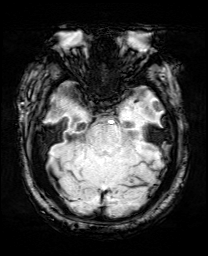
[im 37/56]
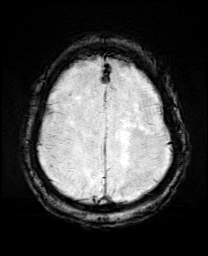
[im 56/56]
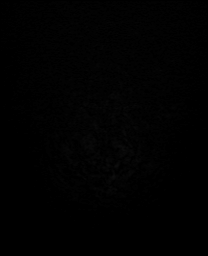

[Series 13: mip_images(sw) · axial · 24.0mm · 0.94mm/px · z∈[-32,+111]mm · 4 of 49 slices shown]
[im 1/49]
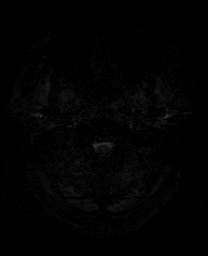
[im 17/49]
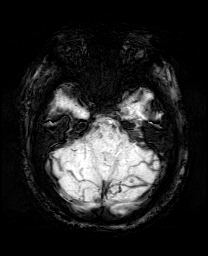
[im 33/49]
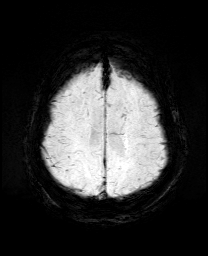
[im 49/49]
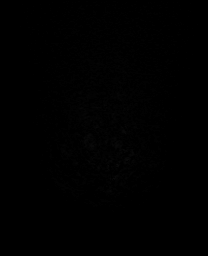

[Series 14: T1 · sagittal · 5.0mm · 0.75mm/px · 2 of 25 slices shown]
[im 1/25]
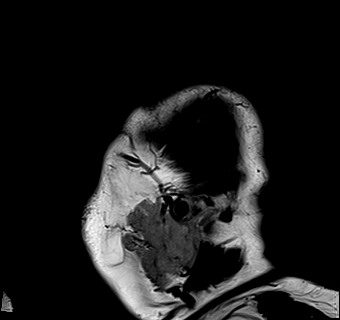
[im 25/25]
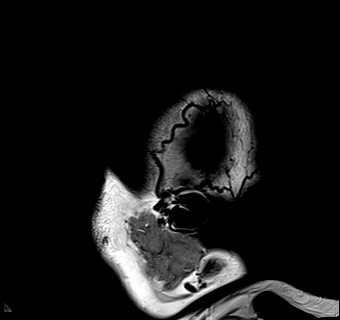

[Series 15: T2 · axial · 5.0mm · 0.75mm/px · z∈[-28,+113]mm · 2 of 25 slices shown (1 of 2)]
[im 1/25]
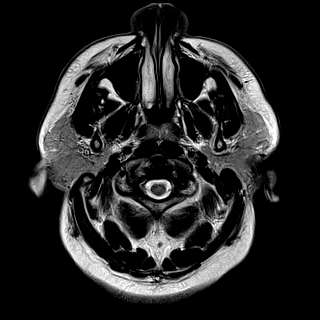
[im 25/25]
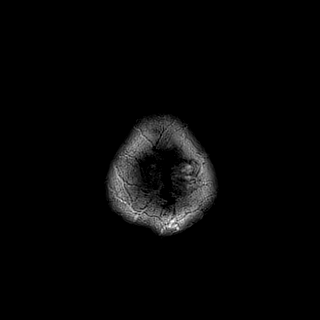

[Series 17: T2 · coronal · 5.0mm · 0.34mm/px · 2 of 30 slices shown (2 of 2)]
[im 1/30]
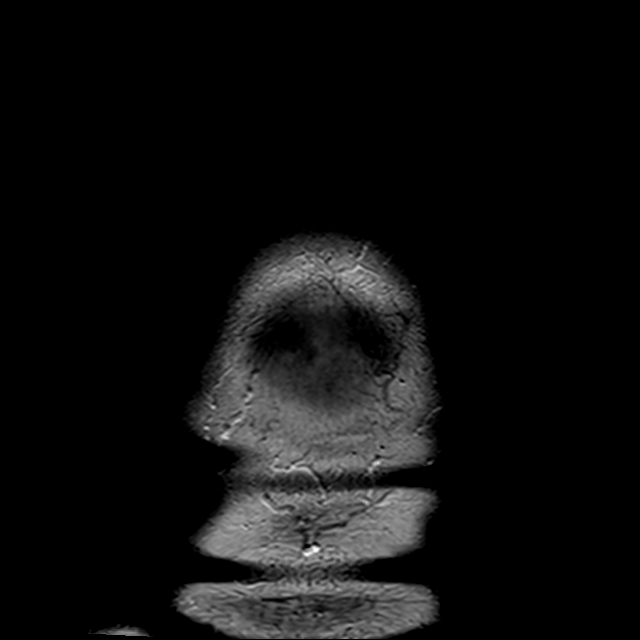
[im 30/30]
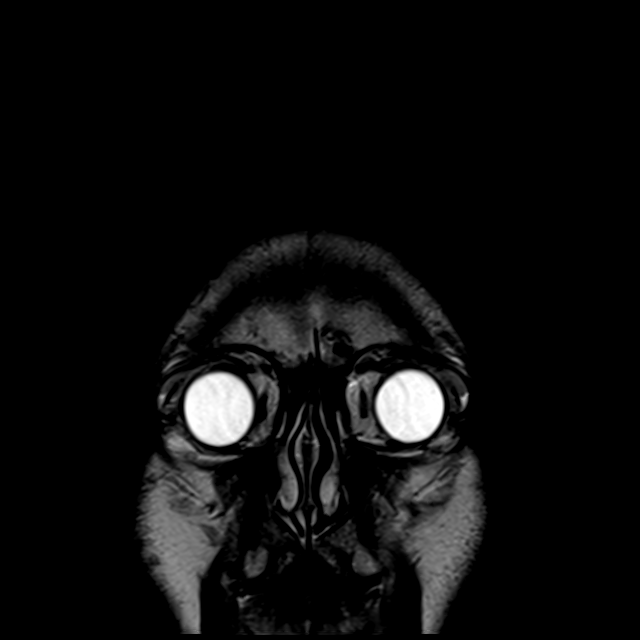

[44 of 48 positions shown; findings below may reference images not displayed]

FINDINGS: Brain: Small focus of abnormal diffusion restriction at the left
cerebral peduncle. No other diffusion abnormality. Chronic blood
products noted at both basal ganglia. Old small vessel infarcts of
the right caudate head and left lentiform nucleus. There is
multifocal hyperintense T2-weighted signal within the white matter.
Generalized volume loss without a clear lobar predilection.
Pituitary gland appears enlarged with craniocaudal diameter of 9 mm.

Vascular: Major flow voids are preserved.

Skull and upper cervical spine: Normal calvarium and skull base.
Visualized upper cervical spine and soft tissues are normal.

Sinuses/Orbits:No paranasal sinus fluid levels or advanced mucosal
thickening. No mastoid or middle ear effusion. Normal orbits.
IMPRESSION: 1. Small focus of acute ischemia at the left cerebral peduncle. No
hemorrhage or mass effect.
2. Old small vessel infarcts of the right caudate head and left
lentiform nucleus.
3. Pituitary gland appears enlarged with craniocaudal diameter of 9
mm. This may be a pituitary adenoma. Dedicated MRI of the pituitary
gland with and without contrast is recommended for further
characterization and can be performed on an outpatient basis.

## 2022-03-11 ENCOUNTER — Other Ambulatory Visit (HOSPITAL_COMMUNITY): Payer: Self-pay | Admitting: Nephrology

## 2022-03-11 ENCOUNTER — Telehealth: Payer: Self-pay | Admitting: Physician Assistant

## 2022-03-11 ENCOUNTER — Other Ambulatory Visit: Payer: Self-pay | Admitting: Nephrology

## 2022-03-11 DIAGNOSIS — D472 Monoclonal gammopathy: Secondary | ICD-10-CM

## 2022-03-11 NOTE — Telephone Encounter (Signed)
Scheduled appt per 7/11 referral. Pt is aware of appt date and time. Pt is aware to arrive 15 mins prior to appt time and to bring and updated insurance card. Pt is aware of appt location.   

## 2022-03-26 ENCOUNTER — Other Ambulatory Visit: Payer: Self-pay | Admitting: Radiology

## 2022-03-26 ENCOUNTER — Other Ambulatory Visit: Payer: Self-pay | Admitting: Student

## 2022-03-26 ENCOUNTER — Other Ambulatory Visit (HOSPITAL_COMMUNITY): Payer: Self-pay | Admitting: Physician Assistant

## 2022-03-26 ENCOUNTER — Other Ambulatory Visit: Payer: Self-pay | Admitting: Internal Medicine

## 2022-03-26 DIAGNOSIS — D472 Monoclonal gammopathy: Secondary | ICD-10-CM

## 2022-03-26 NOTE — H&P (Signed)
Chief Complaint: Patient was seen in consultation today for non-focal renal biopsy   Referring Physician(s): Singh,Vikas  Supervising Physician: Jacqulynn Cadet  Patient Status: [facility] - Out-pt  History of Present Illness: Joshua Case is a 61 y.o. male with a medical history significant for HTN, DM, obesity and chronic kidney disease stage IV referred to nephrology for worsening renal function. Patient reports a history of heavy NSAID use. Despite discontinuing NSAID use and maintaining control of his blood sugars and blood pressure his kidney function has further declined. He has been referred for transplant consideration.   Interventional Radiology has been asked to evaluate this patient for an image-guided non-focal renal biopsy.   Past Medical History:  Diagnosis Date   Diabetes mellitus without complication ([place])    Hypertension    Stroke ([place])     No past surgical history on file.  Allergies: Victoza [liraglutide]  Medications: Prior to Admission medications   Medication Sig Start Date End Date Taking? Authorizing Provider  amLODipine (NORVASC) 10 MG tablet Take 10 mg by mouth daily. 06/09/12   [provider]  aspirin EC 81 MG tablet Take 1 tablet (81 mg total) by mouth daily. Swallow whole. 04/08/21 04/08/22  Oswald Hillock, MD  gabapentin (NEURONTIN) 300 MG capsule Take 1 capsule (300 mg total) by mouth at bedtime. 02/15/21   Jessy Oto, MD  glimepiride (AMARYL) 4 MG tablet Take 4 mg by mouth daily. 12/12/20   [provider]  insulin detemir (LEVEMIR) 100 UNIT/ML injection Inject 20 Units into the skin at bedtime.    [provider]  Insulin Pen Needle (B-D UF III MINI PEN NEEDLES) 31G X 5 MM MISC 1 each by Does not apply route daily. 03/31/14   Roselee Culver, MD  metoprolol succinate (TOPROL-XL) 100 MG 24 hr tablet Take 100 mg by mouth 2 (two) times daily. Patient not taking: Reported on 05/02/2021 12/12/20   [provider]   olmesartan (BENICAR) 40 MG tablet Take 40 mg by mouth daily. 07/28/21   [provider]  oxyCODONE-acetaminophen (PERCOCET) 5-325 MG tablet Take 2 tablets by mouth every 6 (six) hours as needed for severe pain. Patient not taking: Reported on 08/07/2021 08/19/18   Jessy Oto, MD  Phenylephrine-DM-GG-APAP (DELSYM COUGH/COLD DAYTIME) 5-10-200-325 MG/10ML LIQD Take 10 mLs by mouth every 12 (twelve) hours as needed (cough).    [provider]  rosuvastatin (CRESTOR) 20 MG tablet Take 20 mg by mouth daily.    [provider]  valsartan (DIOVAN) 160 MG tablet Take 1 tablet (160 mg total) by mouth daily. Patient not taking: Reported on 05/02/2021 04/08/21 04/08/22  Oswald Hillock, MD     Family History  Problem Relation Age of Onset   Heart disease Mother    Diabetes Father    Hypertension Father    Diabetes Daughter    Hypertension Daughter     Social History   Socioeconomic History   Marital status: Married    Spouse name: Not on file   Number of children: Not on file   Years of education: Not on file   Highest education level: Not on file  Occupational History   Not on file  Tobacco Use   Smoking status: Never   Smokeless tobacco: Never  Vaping Use   Vaping Use: Every day  Substance and Sexual Activity   Alcohol use: Yes    Alcohol/week: 0.0 standard drinks of alcohol   Drug use: Yes  Types: Marijuana   Sexual activity: Not on file  Other Topics Concern   Not on file  Social History Narrative   Not on file   Social Determinants of Health   Financial Resource Strain: Not on file  Food Insecurity: Not on file  Transportation Needs: Not on file  Physical Activity: Not on file  Stress: Not on file  Social Connections: Not on file    Review of Systems: A 12 point ROS discussed and pertinent positives are indicated in the HPI above.  All other systems are negative.  Review of Systems  Constitutional:  Negative for appetite change and fatigue.   Respiratory:  Positive for shortness of breath. Negative for cough.   Cardiovascular:  Positive for leg swelling. Negative for chest pain.  Gastrointestinal:  Negative for abdominal pain, diarrhea, nausea and vomiting.  Genitourinary:  Negative for difficulty urinating.  Musculoskeletal:  Negative for back pain.  Neurological:  Negative for dizziness and headaches.    Vital Signs: BP (!) 163/88   Pulse 65   Temp 98.3 F (36.8 C) (Oral)   Ht '6\' 2"'$  (1.88 m)   Wt (!) 305 lb (138.3 kg)   SpO2 100%   BMI 39.16 kg/m   Physical Exam Constitutional:      General: He is not in acute distress.    Appearance: He is obese. He is not ill-appearing.  HENT:     Mouth/Throat:     Mouth: Mucous membranes are moist.     Pharynx: Oropharynx is clear.  Cardiovascular:     Rate and Rhythm: Normal rate and regular rhythm.     Pulses: Normal pulses.     Heart sounds: Normal heart sounds.  Pulmonary:     Effort: Pulmonary effort is normal.     Breath sounds: Normal breath sounds.  Abdominal:     General: Bowel sounds are normal.     Palpations: Abdomen is soft.  Musculoskeletal:     Right lower leg: Edema present.     Left lower leg: Edema present.  Skin:    General: Skin is warm and dry.  Neurological:     Mental Status: He is alert and oriented to person, place, and time.     Imaging: No results found.  Labs:  CBC: Recent Labs    04/05/21 1451 04/07/21 0021 08/07/21 1656 08/07/21 1712 03/27/22 0832  WBC 7.1 6.9 9.2  --  8.2  HGB 11.9* 11.3* 10.0* 10.9* 11.1*  HCT 38.1* 35.9* 33.5* 32.0* 36.4*  PLT 289 263 307  --  265    COAGS: Recent Labs    04/05/21 1451 08/07/21 1656 03/27/22 0832  INR 1.0 1.0 1.0  APTT 28  --   --     BMP: Recent Labs    04/05/21 1451 04/07/21 0021 04/08/21 0420 08/07/21 1656 08/07/21 1712  NA 140 136 135 136 139  K 4.5 4.7 4.6 4.6 4.6  CL 111 109 109 108  --   CO2 21* 22 20* 21*  --   GLUCOSE 61* 89 105* 183*  --   BUN 34* 29*  27* 25*  --   CALCIUM 8.6* 8.5* 8.5* 8.4*  --   CREATININE 3.14* 2.52* 2.51* 2.83*  --   GFRNONAA 22* 29* 29* 25*  --     LIVER FUNCTION TESTS: Recent Labs    04/05/21 1451 08/07/21 1656  BILITOT 0.7 0.5  AST 16 23  ALT 15 18  ALKPHOS 66 67  PROT 6.6 6.6  ALBUMIN 3.0* 2.7*    TUMOR MARKERS: No results for input(s): "AFPTM", "CEA", "CA199", "CHROMGRNA" in the last 8760 hours.  Assessment and Plan:  Chronic kidney disease stage IV; proteinuria: Joshua Case, 61 year old male, presents today to the [facility] department for an image-guided non-focal renal biopsy.  Risks and benefits of this procedure were discussed with the patient and/or patient's family including, but not limited to bleeding, infection, damage to adjacent structures or low yield requiring additional tests.  All of the questions were answered and there is agreement to proceed. He has been NPO. He takes 81 mg aspirin but has not taken any in almost two weeks. He did not take his blood pressure medication this morning - 5 mg IV metoprolol x1 ordered for hypertension.   Consent signed and in chart.  Thank you for this interesting consult.  I greatly enjoyed meeting Joshua Case and look forward to participating in their care.  A copy of this report was sent to the requesting provider on this date.  Electronically Signed: Soyla Dryer, AGACNP-BC [phone] 03/27/2022, 9:29 AM   I spent a total of  30 Minutes   in face to face in clinical consultation, greater than 50% of which was counseling/coordinating care for non-focal renal biopsy.

## 2022-03-27 ENCOUNTER — Other Ambulatory Visit: Payer: Self-pay

## 2022-03-27 ENCOUNTER — Ambulatory Visit (HOSPITAL_COMMUNITY)
Admission: RE | Admit: 2022-03-27 | Discharge: 2022-03-27 | Disposition: A | Discharge status: Home / Self Care | Payer: Federal, State, Local not specified - PPO | Source: Ambulatory Visit | Attending: Nephrology | Admitting: Nephrology

## 2022-03-27 DIAGNOSIS — N184 Chronic kidney disease, stage 4 (severe): Secondary | ICD-10-CM | POA: Diagnosis not present

## 2022-03-27 DIAGNOSIS — D472 Monoclonal gammopathy: Secondary | ICD-10-CM | POA: Insufficient documentation

## 2022-03-27 DIAGNOSIS — R809 Proteinuria, unspecified: Secondary | ICD-10-CM | POA: Insufficient documentation

## 2022-03-27 DIAGNOSIS — Z6839 Body mass index (BMI) 39.0-39.9, adult: Secondary | ICD-10-CM | POA: Insufficient documentation

## 2022-03-27 DIAGNOSIS — E1122 Type 2 diabetes mellitus with diabetic chronic kidney disease: Secondary | ICD-10-CM | POA: Insufficient documentation

## 2022-03-27 DIAGNOSIS — Z794 Long term (current) use of insulin: Secondary | ICD-10-CM | POA: Insufficient documentation

## 2022-03-27 DIAGNOSIS — I129 Hypertensive chronic kidney disease with stage 1 through stage 4 chronic kidney disease, or unspecified chronic kidney disease: Secondary | ICD-10-CM | POA: Diagnosis not present

## 2022-03-27 DIAGNOSIS — E669 Obesity, unspecified: Secondary | ICD-10-CM | POA: Diagnosis not present

## 2022-03-27 LAB — CBC
HCT: 36.4 % — ABNORMAL LOW (ref 39.0–52.0)
Hemoglobin: 11.1 g/dL — ABNORMAL LOW (ref 13.0–17.0)
MCH: 26.7 pg (ref 26.0–34.0)
MCHC: 30.5 g/dL (ref 30.0–36.0)
MCV: 87.7 fL (ref 80.0–100.0)
Platelets: 265 10*3/uL (ref 150–400)
RBC: 4.15 MIL/uL — ABNORMAL LOW (ref 4.22–5.81)
RDW: 15.5 % (ref 11.5–15.5)
WBC: 8.2 10*3/uL (ref 4.0–10.5)
nRBC: 0 % (ref 0.0–0.2)

## 2022-03-27 LAB — PROTIME-INR
INR: 1 (ref 0.8–1.2)
Prothrombin Time: 13.3 seconds (ref 11.4–15.2)

## 2022-03-27 MED ORDER — SODIUM CHLORIDE 0.9 % IV SOLN: INTRAVENOUS | Status: DC

## 2022-03-27 MED ORDER — MIDAZOLAM HCL 2 MG/2ML IJ SOLN
INTRAMUSCULAR | Status: AC
  Filled 2022-03-27: qty 2

## 2022-03-27 MED ORDER — METOPROLOL TARTRATE 5 MG/5ML IV SOLN
5.0000 mg | Freq: Once | INTRAVENOUS | Status: AC
  Administered 2022-03-27: 5 mg via INTRAVENOUS
  Filled 2022-03-27: qty 5

## 2022-03-27 MED ORDER — FENTANYL CITRATE (PF) 100 MCG/2ML IJ SOLN
INTRAMUSCULAR | Status: AC
  Filled 2022-03-27: qty 2

## 2022-03-27 MED ORDER — GELATIN ABSORBABLE 12-7 MM EX MISC
CUTANEOUS | Status: AC
  Filled 2022-03-27: qty 1

## 2022-03-27 MED ORDER — FENTANYL CITRATE (PF) 100 MCG/2ML IJ SOLN
INTRAMUSCULAR | Status: AC | PRN
  Administered 2022-03-27: 50 ug via INTRAVENOUS

## 2022-03-27 MED ORDER — MIDAZOLAM HCL 2 MG/2ML IJ SOLN
INTRAMUSCULAR | Status: AC | PRN
  Administered 2022-03-27: 1 mg via INTRAVENOUS

## 2022-03-27 MED ORDER — LIDOCAINE HCL (PF) 1 % IJ SOLN
INTRAMUSCULAR | Status: AC
  Filled 2022-03-27: qty 30

## 2022-03-27 MED ORDER — METOPROLOL TARTRATE 5 MG/5ML IV SOLN
INTRAVENOUS | Status: AC
  Filled 2022-03-27: qty 5

## 2022-03-27 MED ORDER — HYDRALAZINE HCL 20 MG/ML IJ SOLN
INTRAMUSCULAR | Status: AC | PRN
  Administered 2022-03-27: 10 mg via INTRAVENOUS

## 2022-03-27 MED ORDER — HYDRALAZINE HCL 20 MG/ML IJ SOLN
INTRAMUSCULAR | Status: AC
  Filled 2022-03-27: qty 1

## 2022-03-27 NOTE — Procedures (Signed)
Interventional Radiology Procedure Note  Procedure: LEFT random renal bx  Complications: None immediate  Estimated Blood Loss: None  Recommendations: - Bedrest x 4 hrs - DC home  Signed,  Criselda Peaches, MD

## 2022-03-31 ENCOUNTER — Encounter (HOSPITAL_COMMUNITY): Payer: Self-pay

## 2022-03-31 LAB — SURGICAL PATHOLOGY

## 2022-04-03 NOTE — Progress Notes (Signed)
[place] [phone]) [phone]   Fax:([phone]) [place] NOTE  Patient Care Team: Lin Landsman, MD as PCP - General (Family Medicine)  Hematological/Oncological History 1) Labs from nephrologist, Dr. Gean Quint: -02/26/2022: M spike detected measuring 0.3 g/dL. Immunofixation showed IgM monoclonal protein with kappa light chain specificity. Kappa 120.6, Lambda 93.9, Ratio 1.28, Wbc 7.4, Hgb 11.2, MCV 80, Plt 294, Creatinine 3.86, Calcium 8.8  2) 04/04/2022: Establish care with [facility] Hematology with Dr. Narda Rutherford and Dede Query PA-C  CHIEF COMPLAINTS/PURPOSE OF CONSULTATION:  Monoclonal Gammopathy  HISTORY OF PRESENTING ILLNESS:  Joshua Case 61 y.o. male with medical history significant for diabetes, hypertension and stroke who presents to hematology clinic for evaluation for monoclonal gammopathy. He is unaccompanied for this visit.   On exam today, Joshua Case reports that he has chronic fatigue since his stroke in August 2022. He is able to complete his ADLs on his own. He has a good appetite and denies any noticeable weight changes. He denies nausea, vomiting, constipation or diarrhea. He denies easy bruising or signs of active bleeding. He has some shortness of breath with exertion but none at rest. He has occasional headaches that has been chronic.  He denies any fevers, chills, sweats, chest pain, cough, peripheral neuropathy. He has no other complaints. Rest of the 10 point ROS is below.   MEDICAL HISTORY:  Past Medical History:  Diagnosis Date   CKD (chronic kidney disease)    Diabetes mellitus without complication ([place])    Hypertension    Stroke [facility])     SURGICAL HISTORY: History reviewed. No pertinent surgical history.  SOCIAL HISTORY: Social History   Socioeconomic History   Marital status: Married    Spouse name: Not on file   Number of children: Not on file   Years of education: Not on file   Highest education level: Not on  file  Occupational History   Not on file  Tobacco Use   Smoking status: Never   Smokeless tobacco: Never  Vaping Use   Vaping Use: Every day  Substance and Sexual Activity   Alcohol use: Yes    Comment: occasional   Drug use: Yes    Types: Marijuana   Sexual activity: Not on file  Other Topics Concern   Not on file  Social History Narrative   Not on file   Social Determinants of Health   Financial Resource Strain: Not on file  Food Insecurity: Not on file  Transportation Needs: Not on file  Physical Activity: Not on file  Stress: Not on file  Social Connections: Not on file  Intimate Partner Violence: Not on file    FAMILY HISTORY: Family History  Problem Relation Age of Onset   Heart disease Mother    Diabetes Father    Hypertension Father    Diabetes Daughter    Hypertension Daughter     ALLERGIES:  is allergic to [place] [liraglutide].  MEDICATIONS:  Current Outpatient Medications  Medication Sig Dispense Refill   amLODipine (NORVASC) 10 MG tablet Take 10 mg by mouth daily.     aspirin EC 81 MG tablet Take 1 tablet (81 mg total) by mouth daily. Swallow whole. 90 tablet 3   furosemide (LASIX) 40 MG tablet Take 40 mg by mouth 2 (two) times daily.     gabapentin (NEURONTIN) 300 MG capsule Take 1 capsule (300 mg total) by mouth at bedtime. 30 capsule 3   Insulin Pen Needle (B-D UF III MINI  PEN NEEDLES) 31G X 5 MM MISC 1 each by Does not apply route daily. 100 each 5   metoprolol succinate (TOPROL-XL) 100 MG 24 hr tablet Take 100 mg by mouth 2 (two) times daily.     valsartan (DIOVAN) 160 MG tablet Take 1 tablet (160 mg total) by mouth daily. (Patient not taking: Reported on 05/02/2021) 30 tablet 11   No current facility-administered medications for this visit.    REVIEW OF SYSTEMS:   Constitutional: ( - ) fevers, ( - )  chills , ( - ) night sweats Eyes: ( - ) blurriness of vision, ( - ) double vision, ( - ) watery eyes Ears, nose, mouth, throat, and face: ( - )  mucositis, ( - ) sore throat Respiratory: ( - ) cough, ( - ) dyspnea, ( - ) wheezes Cardiovascular: ( - ) palpitation, ( - ) chest discomfort, (+ ) lower extremity swelling Gastrointestinal:  ( - ) nausea, ( - ) heartburn, ( - ) change in bowel habits Skin: ( - ) abnormal skin rashes Lymphatics: ( - ) new lymphadenopathy, ( - ) easy bruising Neurological: ( - ) numbness, ( - ) tingling, ( - ) new weaknesses Behavioral/Psych: ( - ) mood change, ( - ) new changes  All other systems were reviewed with the patient and are negative.  PHYSICAL EXAMINATION: ECOG PERFORMANCE STATUS: 1 - Symptomatic but completely ambulatory  Vitals:   04/04/22 1125  BP: 138/74  Pulse: (!) 58  Resp: 19  Temp: 98.2 F (36.8 C)  SpO2: 100%   Filed Weights   04/04/22 1125  Weight: (!) 308 lb 3.2 oz (139.8 kg)    GENERAL: well appearing male in NAD  SKIN: skin color, texture, turgor are normal, no rashes or significant lesions EYES: conjunctiva are pink and non-injected, sclera clear OROPHARYNX: no exudate, no erythema; lips, buccal mucosa, and tongue normal  NECK: supple, non-tender LYMPH:  no palpable lymphadenopathy in the cervical or supraclavicular lymph nodes.  LUNGS: clear to auscultation and percussion with normal breathing effort HEART: regular rate & rhythm and no murmurs. +B/L lower extremity edema  ABDOMEN: soft, non-tender, non-distended, normal bowel sounds Musculoskeletal: no cyanosis of digits and no clubbing  PSYCH: alert & oriented x 3, fluent speech NEURO: no focal motor/sensory deficits  LABORATORY DATA:  I have reviewed the data as listed    Latest Ref Rng & Units 03/27/2022    8:32 AM 08/07/2021    5:12 PM 08/07/2021    4:56 PM  CBC  WBC 4.0 - 10.5 K/uL 8.2   9.2   Hemoglobin 13.0 - 17.0 g/dL 11.1  10.9  10.0   Hematocrit 39.0 - 52.0 % 36.4  32.0  33.5   Platelets 150 - 400 K/uL 265   307        Latest Ref Rng & Units 08/07/2021    5:12 PM 08/07/2021    4:56 PM 04/08/2021     4:20 AM  CMP  Glucose 70 - 99 mg/dL  183  105   BUN 6 - 20 mg/dL  25  27   Creatinine 0.61 - 1.24 mg/dL  2.83  2.51   Sodium 135 - 145 mmol/L 139  136  135   Potassium 3.5 - 5.1 mmol/L 4.6  4.6  4.6   Chloride 98 - 111 mmol/L  108  109   CO2 22 - 32 mmol/L  21  20   Calcium 8.9 - 10.3 mg/dL  8.4  8.5  Total Protein 6.5 - 8.1 g/dL  6.6    Total Bilirubin 0.3 - 1.2 mg/dL  0.5    Alkaline Phos 38 - 126 U/L  67    AST 15 - 41 U/L  23    ALT 0 - 44 U/L  18      RADIOGRAPHIC STUDIES: I have personally reviewed the radiological images as listed and agreed with the findings in the report. US BIOPSY (KIDNEY)  Result Date: 03/27/2022 INDICATION: 61 year old male with chronic kidney disease and persistent proteinuria. EXAM: ULTRASOUND GUIDED RENAL BIOPSY COMPARISON:  None Available. MEDICATIONS: Fentanyl 50 mcg IV; Versed 1 mg IV; Lopressor 5 mg; hydralazine 10 mg ANESTHESIA/SEDATION: Patient's vital signs and level of consciousness were monitored continuously by radiology nursing under my direct supervision. Total Moderate Sedation time 16 minutes minutes COMPLICATIONS: None immediate PROCEDURE: Informed written consent was obtained from the patient after a discussion of the risks, benefits and alternatives to treatment. The patient understands and consents the procedure. A timeout was performed prior to the initiation of the procedure. Ultrasound scanning was performed of the bilateral flanks. The inferior pole of the left kidney was selected for biopsy due to location and sonographic window. The procedure was planned. The operative site was prepped and draped in the usual sterile fashion. The overlying soft tissues were anesthetized with 1% lidocaine with epinephrine. A 17 gauge core needle biopsy device was advanced into the inferior cortex of the left kidney and 2 core biopsies were obtained under direct ultrasound guidance. Images were saved for documentation purposes. The biopsy device was removed  and hemostasis was obtained with manual compression. Post procedural scanning was negative for significant post procedural hemorrhage or additional complication. A dressing was placed. The patient tolerated the procedure well without immediate post procedural complication. IMPRESSION: Technically successful ultrasound guided left renal biopsy. Electronically Signed   By: Jacqulynn Cadet M.D.   On: 03/27/2022 18:16    ASSESSMENT & PLAN Joshua Case is a 61 y.o. male who presents for initial evaluation for monoclonal gammopathy. Based on SPEP from 02/26/2022, there is evidence of M-protein measuring 0.3. g/dL. Immunofixation showed IgM monoclonal protein with kappa light chain specificity.   We recommend patient proceed with complete workup with CBC, CMP, multiple myeloma panel, kappa/lambda light chain, 25 hour UPEP, beta 2 microglobulin and LDH levels. Bone Survey will be obtained to evaluate for lytic lesions. Due to IgM monoclonal protein presentation, we recommend a bone marrow biopsy.    #Monoclonal gammopathy: --Recommend further workup with CBC, CMP, multiple myeloma panel, kappa/lambda light chain, UPEP,beta 2 microglobulin and LDH levels.  --Need to obtain DG bone met surgery to evaluate for lytic lesions.  --Recommend bone marrow biopsy for IgM monoclonal protein.  --RTC in 3 months with repeat labs unless above workup requires more intervention.    Orders Placed This Encounter  Procedures   DG Bone Survey Met    Standing Status:   Future    Standing Expiration Date:   04/04/2023    Order Specific Question:   Reason for Exam (SYMPTOM  OR DIAGNOSIS REQUIRED)    Answer:   evaluate for lytic lesions    Order Specific Question:   Preferred imaging location?    Answer:   [facility]   CBC with Differential ([place] Only)    Standing Status:   Future    Number of Occurrences:   1    Standing Expiration Date:   04/04/2023   CMP ([place] only)  Standing Status:    Future    Number of Occurrences:   1    Standing Expiration Date:   04/04/2023   Multiple Myeloma Panel (SPEP&IFE w/QIG)    Standing Status:   Future    Number of Occurrences:   1    Standing Expiration Date:   04/03/2023   Kappa/lambda light chains    Standing Status:   Future    Number of Occurrences:   1    Standing Expiration Date:   04/03/2023   Beta 2 microglobulin    Standing Status:   Future    Number of Occurrences:   1    Standing Expiration Date:   04/03/2023   Lactate dehydrogenase (LDH)    Standing Status:   Future    Number of Occurrences:   1    Standing Expiration Date:   04/03/2023   24-Hr Ur UPEP/UIFE/Light Chains/TP    Standing Status:   Future    Standing Expiration Date:   04/03/2023    All questions were answered. The patient knows to call the clinic with any problems, questions or concerns.  I have spent a total of 60 minutes minutes of face-to-face and non-face-to-face time, preparing to see the patient, obtaining and/or reviewing separately obtained history, performing a medically appropriate examination, counseling and educating the patient, ordering tests/procedures, documenting clinical information in the electronic health record, and care coordination.   Dede Query, PA-C Department of Hematology/Oncology [place] at [facility] Phone: [phone]  Patient was seen with Dr. Lorenso Courier  I have read the above note and personally examined the patient. I agree with the assessment and plan as noted above.  Briefly Joshua Case is a 61 year old male who presents for evaluation of monoclonal myopathy.  His labs showed M spike measuring 0.3 g/dL. Immunofixation showed IgM monoclonal protein with kappa light chain specificity. Kappa 120.6, Lambda 93.9, Ratio 1.28.  We will conduct a full monoclonal apathy workup to include UPEP and bone survey.  Given the IgM specificity of his M protein, if it is seen again on her SPEP we will need to pursue a  bone marrow biopsy.  The patient voiced understanding of the workup moving forward.   Ledell Peoples, MD Department of Hematology/Oncology [place] at [facility] Phone: [phone] Pager: [phone] Email: [email or web address]

## 2022-04-04 ENCOUNTER — Telehealth: Payer: Self-pay | Admitting: Physician Assistant

## 2022-04-04 ENCOUNTER — Inpatient Hospital Stay: Payer: Federal, State, Local not specified - PPO

## 2022-04-04 ENCOUNTER — Inpatient Hospital Stay: Payer: Federal, State, Local not specified - PPO | Attending: Physician Assistant | Admitting: Physician Assistant

## 2022-04-04 ENCOUNTER — Other Ambulatory Visit: Payer: Self-pay

## 2022-04-04 ENCOUNTER — Telehealth: Payer: Self-pay

## 2022-04-04 ENCOUNTER — Encounter: Payer: Self-pay | Admitting: Physician Assistant

## 2022-04-04 VITALS — BP 138/74 | HR 58 | Temp 98.2°F | Resp 19 | Ht 74.0 in | Wt 308.2 lb

## 2022-04-04 DIAGNOSIS — F17290 Nicotine dependence, other tobacco product, uncomplicated: Secondary | ICD-10-CM | POA: Insufficient documentation

## 2022-04-04 DIAGNOSIS — I129 Hypertensive chronic kidney disease with stage 1 through stage 4 chronic kidney disease, or unspecified chronic kidney disease: Secondary | ICD-10-CM | POA: Diagnosis not present

## 2022-04-04 DIAGNOSIS — E1122 Type 2 diabetes mellitus with diabetic chronic kidney disease: Secondary | ICD-10-CM | POA: Insufficient documentation

## 2022-04-04 DIAGNOSIS — N189 Chronic kidney disease, unspecified: Secondary | ICD-10-CM | POA: Insufficient documentation

## 2022-04-04 DIAGNOSIS — D472 Monoclonal gammopathy: Secondary | ICD-10-CM | POA: Diagnosis present

## 2022-04-04 LAB — CMP (CANCER CENTER ONLY)
ALT: 8 U/L (ref 0–44)
AST: 9 U/L — ABNORMAL LOW (ref 15–41)
Albumin: 3.4 g/dL — ABNORMAL LOW (ref 3.5–5.0)
Alkaline Phosphatase: 72 U/L (ref 38–126)
Anion gap: 6 (ref 5–15)
BUN: 59 mg/dL — ABNORMAL HIGH (ref 6–20)
CO2: 22 mmol/L (ref 22–32)
Calcium: 8 mg/dL — ABNORMAL LOW (ref 8.9–10.3)
Chloride: 109 mmol/L (ref 98–111)
Creatinine: 5.16 mg/dL (ref 0.61–1.24)
GFR, Estimated: 12 mL/min — ABNORMAL LOW (ref >60)
Glucose, Bld: 143 mg/dL — ABNORMAL HIGH (ref 70–99)
Potassium: 5.2 mmol/L — ABNORMAL HIGH (ref 3.5–5.1)
Sodium: 137 mmol/L (ref 135–145)
Total Bilirubin: 0.3 mg/dL (ref 0.3–1.2)
Total Protein: 7 g/dL (ref 6.5–8.1)

## 2022-04-04 LAB — CBC WITH DIFFERENTIAL (CANCER CENTER ONLY)
Abs Immature Granulocytes: 0.02 10*3/uL (ref 0.00–0.07)
Basophils Absolute: 0.1 10*3/uL (ref 0.0–0.1)
Basophils Relative: 1 %
Eosinophils Absolute: 0.3 10*3/uL (ref 0.0–0.5)
Eosinophils Relative: 4 %
HCT: 34.7 % — ABNORMAL LOW (ref 39.0–52.0)
Hemoglobin: 10.9 g/dL — ABNORMAL LOW (ref 13.0–17.0)
Immature Granulocytes: 0 %
Lymphocytes Relative: 20 %
Lymphs Abs: 1.4 10*3/uL (ref 0.7–4.0)
MCH: 26.8 pg (ref 26.0–34.0)
MCHC: 31.4 g/dL (ref 30.0–36.0)
MCV: 85.5 fL (ref 80.0–100.0)
Monocytes Absolute: 0.5 10*3/uL (ref 0.1–1.0)
Monocytes Relative: 8 %
Neutro Abs: 4.4 10*3/uL (ref 1.7–7.7)
Neutrophils Relative %: 67 %
Platelet Count: 269 10*3/uL (ref 150–400)
RBC: 4.06 MIL/uL — ABNORMAL LOW (ref 4.22–5.81)
RDW: 15.3 % (ref 11.5–15.5)
WBC Count: 6.6 10*3/uL (ref 4.0–10.5)
nRBC: 0 % (ref 0.0–0.2)

## 2022-04-04 LAB — LACTATE DEHYDROGENASE: LDH: 148 U/L (ref 98–192)

## 2022-04-04 NOTE — Telephone Encounter (Signed)
CRITICAL VALUE STICKER  CRITICAL VALUE:   creatinine  5.16  RECEIVER (on-site recipient of call):  Bonnita Nasuti, [employer] NOTIFIED: 1:28  MESSENGER (representative from lab):  Hillary  MD NOTIFIED:   Charlies Silvers  TIME OF NOTIFICATION:  1:29

## 2022-04-04 NOTE — Telephone Encounter (Signed)
Per 8/4 los called and spoke to pt about appointment pt confirmed appointment

## 2022-04-05 ENCOUNTER — Other Ambulatory Visit: Payer: Self-pay | Admitting: Radiology

## 2022-04-05 DIAGNOSIS — D472 Monoclonal gammopathy: Secondary | ICD-10-CM

## 2022-04-05 LAB — BETA 2 MICROGLOBULIN, SERUM: Beta-2 Microglobulin: 7.6 mg/L — ABNORMAL HIGH (ref 0.6–2.4)

## 2022-04-07 ENCOUNTER — Other Ambulatory Visit: Payer: Self-pay

## 2022-04-07 ENCOUNTER — Ambulatory Visit (HOSPITAL_COMMUNITY)
Admission: RE | Admit: 2022-04-07 | Discharge: 2022-04-07 | Disposition: A | Discharge status: Home / Self Care | Payer: Federal, State, Local not specified - PPO | Source: Ambulatory Visit | Attending: Physician Assistant | Admitting: Physician Assistant

## 2022-04-07 ENCOUNTER — Encounter (HOSPITAL_COMMUNITY): Payer: Self-pay

## 2022-04-07 DIAGNOSIS — R809 Proteinuria, unspecified: Secondary | ICD-10-CM | POA: Insufficient documentation

## 2022-04-07 DIAGNOSIS — D72822 Plasmacytosis: Secondary | ICD-10-CM | POA: Diagnosis not present

## 2022-04-07 DIAGNOSIS — D472 Monoclonal gammopathy: Secondary | ICD-10-CM | POA: Insufficient documentation

## 2022-04-07 DIAGNOSIS — D649 Anemia, unspecified: Secondary | ICD-10-CM | POA: Insufficient documentation

## 2022-04-07 LAB — GLUCOSE, CAPILLARY: Glucose-Capillary: 132 mg/dL — ABNORMAL HIGH (ref 70–99)

## 2022-04-07 LAB — CBC WITH DIFFERENTIAL/PLATELET
Abs Immature Granulocytes: 0.02 10*3/uL (ref 0.00–0.07)
Basophils Absolute: 0.1 10*3/uL (ref 0.0–0.1)
Basophils Relative: 1 %
Eosinophils Absolute: 0.4 10*3/uL (ref 0.0–0.5)
Eosinophils Relative: 5 %
HCT: 36.6 % — ABNORMAL LOW (ref 39.0–52.0)
Hemoglobin: 11.1 g/dL — ABNORMAL LOW (ref 13.0–17.0)
Immature Granulocytes: 0 %
Lymphocytes Relative: 23 %
Lymphs Abs: 1.7 10*3/uL (ref 0.7–4.0)
MCH: 26.8 pg (ref 26.0–34.0)
MCHC: 30.3 g/dL (ref 30.0–36.0)
MCV: 88.4 fL (ref 80.0–100.0)
Monocytes Absolute: 0.7 10*3/uL (ref 0.1–1.0)
Monocytes Relative: 9 %
Neutro Abs: 4.6 10*3/uL (ref 1.7–7.7)
Neutrophils Relative %: 62 %
Platelets: 284 10*3/uL (ref 150–400)
RBC: 4.14 MIL/uL — ABNORMAL LOW (ref 4.22–5.81)
RDW: 15.5 % (ref 11.5–15.5)
WBC: 7.3 10*3/uL (ref 4.0–10.5)
nRBC: 0 % (ref 0.0–0.2)

## 2022-04-07 LAB — KAPPA/LAMBDA LIGHT CHAINS
Kappa free light chain: 161.9 mg/L — ABNORMAL HIGH (ref 3.3–19.4)
Kappa, lambda light chain ratio: 1.7 — ABNORMAL HIGH (ref 0.26–1.65)
Lambda free light chains: 95 mg/L — ABNORMAL HIGH (ref 5.7–26.3)

## 2022-04-07 MED ORDER — FENTANYL CITRATE (PF) 100 MCG/2ML IJ SOLN
INTRAMUSCULAR | Status: AC
  Filled 2022-04-07: qty 4

## 2022-04-07 MED ORDER — MIDAZOLAM HCL 2 MG/2ML IJ SOLN
INTRAMUSCULAR | Status: AC | PRN
  Administered 2022-04-07: 1 mg via INTRAVENOUS

## 2022-04-07 MED ORDER — FENTANYL CITRATE (PF) 100 MCG/2ML IJ SOLN
INTRAMUSCULAR | Status: AC | PRN
  Administered 2022-04-07: 50 ug via INTRAVENOUS

## 2022-04-07 MED ORDER — FLUMAZENIL 0.5 MG/5ML IV SOLN
INTRAVENOUS | Status: AC
  Filled 2022-04-07: qty 5

## 2022-04-07 MED ORDER — LIDOCAINE HCL 1 % IJ SOLN
INTRAMUSCULAR | Status: AC | PRN
  Administered 2022-04-07: 10 mL via INTRADERMAL

## 2022-04-07 MED ORDER — SODIUM CHLORIDE 0.9 % IV SOLN: INTRAVENOUS | Status: DC

## 2022-04-07 MED ORDER — NALOXONE HCL 0.4 MG/ML IJ SOLN
INTRAMUSCULAR | Status: AC
  Filled 2022-04-07: qty 1

## 2022-04-07 MED ORDER — MIDAZOLAM HCL 2 MG/2ML IJ SOLN
INTRAMUSCULAR | Status: AC
  Filled 2022-04-07: qty 4

## 2022-04-07 NOTE — H&P (Signed)
Referring Physician(s): Dorsey,J  Supervising Physician: Michaelle Birks  Patient Status:  [facility] OP  Chief Complaint:  "I'm having a bone marrow biopsy"  Subjective: Patient known to IR service from random left renal biopsy on 03/27/2022.  He has a history of IgM monoclonal gammopathy and presents again today for CT-guided bone marrow biopsy for further evaluation.  Additional medical history includes chronic kidney disease, diabetes, hypertension, prior stroke.  He currently denies fever, headache, chest pain, dyspnea, cough, abdominal/back pain, nausea, vomiting or bleeding.    Past Medical History:  Diagnosis Date   CKD (chronic kidney disease)    Diabetes mellitus without complication ([place])    Hypertension    Stroke ([place])    No past surgical history on file.    Allergies: Victo[place] [liraglutide]  Medications: Prior to Admission medications   Medication Sig Start Date End Date Taking? Authorizing Provider  amLODipine (NORVASC) 10 MG tablet Take 10 mg by mouth daily. 06/09/12   [provider]  aspirin EC 81 MG tablet Take 1 tablet (81 mg total) by mouth daily. Swallow whole. 04/08/21 04/08/22  Oswald Hillock, MD  furosemide (LASIX) 40 MG tablet Take 40 mg by mouth 2 (two) times daily. 02/26/22   [provider]  gabapentin (NEURONTIN) 300 MG capsule Take 1 capsule (300 mg total) by mouth at bedtime. 02/15/21   Jessy Oto, MD  Insulin Pen Needle (B-D UF III MINI PEN NEEDLES) 31G X 5 MM MISC 1 each by Does not apply route daily. 03/31/14   Roselee Culver, MD  metoprolol succinate (TOPROL-XL) 100 MG 24 hr tablet Take 100 mg by mouth 2 (two) times daily. 12/12/20   [provider]     Vital Signs:pending   Physical Exam awake, alert.  Chest with slightly diminished breath sounds at bases; heart with regular rate and rhythm.  Abdomen soft, positive bowel sounds, nontender.  Bilateral lower extremity edema noted  Imaging: No results  found.  Labs:  CBC: Recent Labs    08/07/21 1656 08/07/21 1712 03/27/22 0832 04/04/22 1233  WBC 9.2  --  8.2 6.6  HGB 10.0* 10.9* 11.1* 10.9*  HCT 33.5* 32.0* 36.4* 34.7*  PLT 307  --  265 269    COAGS: Recent Labs    08/07/21 1656 03/27/22 0832  INR 1.0 1.0    BMP: Recent Labs    04/08/21 0420 08/07/21 1656 08/07/21 1712 04/04/22 1233  NA 135 136 139 137  K 4.6 4.6 4.6 5.2*  CL 109 108  --  109  CO2 20* 21*  --  22  GLUCOSE 105* 183*  --  143*  BUN 27* 25*  --  59*  CALCIUM 8.5* 8.4*  --  8.0*  CREATININE 2.51* 2.83*  --  5.16*  GFRNONAA 29* 25*  --  12*    LIVER FUNCTION TESTS: Recent Labs    08/07/21 1656 04/04/22 1233  BILITOT 0.5 0.3  AST 23 9*  ALT 18 8  ALKPHOS 67 72  PROT 6.6 7.0  ALBUMIN 2.7* 3.4*    Assessment and Plan: Patient known to IR service from random left renal biopsy on 03/27/2022.  He has a history of IgM monoclonal gammopathy and presents again today for CT-guided bone marrow biopsy for further evaluation.  Additional medical history includes chronic kidney disease, diabetes, hypertension, prior stroke.Risks and benefits of procedure was discussed with the patient  including, but not limited to bleeding, infection, damage to adjacent structures or low yield  requiring additional tests.  All of the questions were answered and there is agreement to proceed.  Consent signed and in chart.    Electronically Signed: D. Rowe Robert, PA-C 04/07/2022, 10:16 AM   I spent a total of 20 minutes at the the patient's bedside AND on the patient's hospital floor or unit, greater than 50% of which was counseling/coordinating care for CT-guided bone marrow biopsy

## 2022-04-07 NOTE — Discharge Instructions (Signed)
Discharge Instructions:   Please call Interventional Radiology clinic [phone] with any questions or concerns.  You may remove your dressing and shower tomorrow.  Moderate Conscious Sedation, Adult, Care After This sheet gives you information about how to care for yourself after your procedure. Your health care provider may also give you more specific instructions. If you have problems or questions, contact your health care provider. What can I expect after the procedure? After the procedure, it is common to have: Sleepiness for several hours. Impaired judgment for several hours. Difficulty with balance. Vomiting if you eat too soon. Follow these instructions at home: For the time period you were told by your health care provider:     Rest. Do not participate in activities where you could fall or become injured. Do not drive or use machinery. Do not drink alcohol. Do not take sleeping pills or medicines that cause drowsiness. Do not make important decisions or sign legal documents. Do not take care of children on your own. Eating and drinking  Follow the diet recommended by your health care provider. Drink enough fluid to keep your urine pale yellow. If you vomit: Drink water, juice, or soup when you can drink without vomiting. Make sure you have [place] or no nausea before eating solid foods. General instructions Take over-the-counter and prescription medicines only as told by your health care provider. Have a responsible adult stay with you for the time you are told. It is important to have someone help care for you until you are awake and alert. Do not smoke. Keep all follow-up visits as told by your health care provider. This is important. Contact a health care provider if: You are still sleepy or having trouble with balance after 24 hours. You feel light-headed. You keep feeling nauseous or you keep vomiting. You develop a rash. You have a fever. You have redness  or swelling around the IV site. Get help right away if: You have trouble breathing. You have new-onset confusion at home. Summary After the procedure, it is common to feel sleepy, have impaired judgment, or feel nauseous if you eat too soon. Rest after you get home. Know the things you should not do after the procedure. Follow the diet recommended by your health care provider and drink enough fluid to keep your urine pale yellow. Get help right away if you have trouble breathing or new-onset confusion at home. This information is not intended to replace advice given to you by your health care provider. Make sure you discuss any questions you have with your health care provider. Document Revised: 12/16/2019 Document Reviewed: 07/14/2019 Elsevier Patient Education  [place]. Discharge Instructions:   Please call Interventional Radiology clinic [phone] with any questions or concerns.  You may remove your dressing and shower tomorrow.      Bone Marrow Aspiration and Bone Marrow Biopsy, Adult, Care After This sheet gives you information about how to care for yourself after your procedure. Your health care provider may also give you more specific instructions. If you have problems or questions, contact your health care provider. What can I expect after the procedure? After the procedure, it is common to have: Mild pain and tenderness. Swelling. Bruising. Follow these instructions at home: Puncture site care  Follow instructions from your health care provider about how to take care of the puncture site. Make sure you: Wash your hands with soap and water before and after you change your bandage (dressing). If soap and water are not available,  use hand sanitizer. Change your dressing as told by your health care provider. Check your puncture site every day for signs of infection. Check for: More redness, swelling, or pain. Fluid or blood. Warmth. Pus or a bad  smell. Activity Return to your normal activities as told by your health care provider. Ask your health care provider what activities are safe for you. Do not lift anything that is heavier than 10 lb (4.5 kg), or the limit that you are told, until your health care provider says that it is safe. Do not drive for 24 hours if you were given a sedative during your procedure. General instructions  Take over-the-counter and prescription medicines only as told by your health care provider. Do not take baths, swim, or use a hot tub until your health care provider approves. Ask your health care provider if you may take showers. You may only be allowed to take sponge baths. If directed, put ice on the affected area. To do this: Put ice in a plastic bag. Place a towel between your skin and the bag. Leave the ice on for 20 minutes, 2-3 times a day. Keep all follow-up visits as told by your health care provider. This is important. Contact a health care provider if: Your pain is not controlled with medicine. You have a fever. You have more redness, swelling, or pain around the puncture site. You have fluid or blood coming from the puncture site. Your puncture site feels warm to the touch. You have pus or a bad smell coming from the puncture site. Summary After the procedure, it is common to have mild pain, tenderness, swelling, and bruising. Follow instructions from your health care provider about how to take care of the puncture site and what activities are safe for you. Take over-the-counter and prescription medicines only as told by your health care provider. Contact a health care provider if you have any signs of infection, such as fluid or blood coming from the puncture site. This information is not intended to replace advice given to you by your health care provider. Make sure you discuss any questions you have with your health care provider. Document Revised: 01/04/2019 Document Reviewed:  01/04/2019 Elsevier Patient Education  [place].

## 2022-04-07 NOTE — Procedures (Signed)
Vascular and Interventional Radiology Procedure Note  Patient: Joshua Case DOB: 1961-01-24 Medical Record Number: [id number] Note Date/Time: 04/07/22 11:22 AM   Performing Physician: Michaelle Birks, MD Assistant(s): None  Diagnosis: Proteinuria. IgM gammopathy.  Procedure: BONE MARROW ASPIRATION and BIOPSY  Anesthesia: Conscious Sedation Complications: None Estimated Blood Loss: Minimal Specimens: Sent for Pathology  Findings:  Successful CT-guided bone marrow aspiration and biopsy A total of 1 cores were obtained. Hemostasis of the tract was achieved using Manual Pressure.  Plan: Bed rest for 1 hours.  See detailed procedure note with images in PACS. The patient tolerated the procedure well without incident or complication and was returned to Recovery in stable condition.    Michaelle Birks, MD [facility] Radiology   Pager. [place]

## 2022-04-09 LAB — UPEP/UIFE/LIGHT CHAINS/TP, 24-HR UR
% BETA, Urine: 12.3 %
ALPHA 1 URINE: 5.3 %
Albumin, U: 62 %
Alpha 2, Urine: 6.6 %
Free Kappa Lt Chains,Ur: 297.11 mg/L — ABNORMAL HIGH (ref 1.17–86.46)
Free Kappa/Lambda Ratio: 4.98 (ref 1.83–14.26)
Free Lambda Lt Chains,Ur: 59.63 mg/L — ABNORMAL HIGH (ref 0.27–15.21)
GAMMA GLOBULIN URINE: 13.8 %
M-SPIKE %, Urine: 5.1 % — ABNORMAL HIGH
M-Spike, Mg/24 Hr: 355 mg/24 hr — ABNORMAL HIGH
Total Protein, Urine-Ur/day: 6962 mg/24 hr — ABNORMAL HIGH (ref 30–150)
Total Protein, Urine: 339.6 mg/dL
Total Volume: 2050

## 2022-04-09 LAB — SURGICAL PATHOLOGY

## 2022-04-11 ENCOUNTER — Telehealth: Payer: Self-pay | Admitting: Physician Assistant

## 2022-04-11 ENCOUNTER — Telehealth: Payer: Self-pay

## 2022-04-11 LAB — MULTIPLE MYELOMA PANEL, SERUM
Albumin SerPl Elph-Mcnc: 2.9 g/dL (ref 2.9–4.4)
Albumin/Glob SerPl: 1 (ref 0.7–1.7)
Alpha 1: 0.2 g/dL (ref 0.0–0.4)
Alpha2 Glob SerPl Elph-Mcnc: 0.8 g/dL (ref 0.4–1.0)
B-Globulin SerPl Elph-Mcnc: 1 g/dL (ref 0.7–1.3)
Gamma Glob SerPl Elph-Mcnc: 1.1 g/dL (ref 0.4–1.8)
Globulin, Total: 3.2 g/dL (ref 2.2–3.9)
IgA: 328 mg/dL (ref 90–386)
IgG (Immunoglobin G), Serum: 948 mg/dL (ref 603–1613)
IgM (Immunoglobulin M), Srm: 351 mg/dL — ABNORMAL HIGH (ref 20–172)
M Protein SerPl Elph-Mcnc: 0.3 g/dL — ABNORMAL HIGH
Total Protein ELP: 6.1 g/dL (ref 6.0–8.5)

## 2022-04-11 NOTE — Telephone Encounter (Signed)
Patient is aware of Bone met survey on Tuesday at 100 with an arrival time of 9:30 at [facility] radiology department.

## 2022-04-14 ENCOUNTER — Encounter (HOSPITAL_COMMUNITY): Payer: Self-pay

## 2022-04-15 ENCOUNTER — Ambulatory Visit (HOSPITAL_COMMUNITY)
Admission: RE | Admit: 2022-04-15 | Discharge: 2022-04-15 | Disposition: A | Discharge status: Home / Self Care | Payer: Federal, State, Local not specified - PPO | Source: Ambulatory Visit | Attending: Physician Assistant | Admitting: Physician Assistant

## 2022-04-15 DIAGNOSIS — D472 Monoclonal gammopathy: Secondary | ICD-10-CM | POA: Insufficient documentation

## 2022-04-17 ENCOUNTER — Encounter (HOSPITAL_COMMUNITY): Payer: Self-pay

## 2022-04-25 NOTE — Telephone Encounter (Signed)
I called [name] to review the bone marrow biopsy results from 04/07/2022. Findings are consistent with smoldering multiple myeloma due to 11% plasma cells. Based on renal biopsy from 03/27/2022, there is no evidence of paraprotein-related renal disease. Recommend to monitor closely and we will see patient back in 3 months to repeat labs.

## 2022-05-23 ENCOUNTER — Other Ambulatory Visit: Payer: Self-pay | Admitting: *Deleted

## 2022-05-23 DIAGNOSIS — N179 Acute kidney failure, unspecified: Secondary | ICD-10-CM

## 2022-06-02 ENCOUNTER — Ambulatory Visit (HOSPITAL_COMMUNITY)
Admission: RE | Admit: 2022-06-02 | Discharge: 2022-06-02 | Disposition: A | Discharge status: Home / Self Care | Payer: Federal, State, Local not specified - PPO | Source: Ambulatory Visit | Attending: Surgery | Admitting: Surgery

## 2022-06-02 ENCOUNTER — Ambulatory Visit (INDEPENDENT_AMBULATORY_CARE_PROVIDER_SITE_OTHER)
Admission: RE | Admit: 2022-06-02 | Discharge: 2022-06-02 | Disposition: A | Discharge status: Home / Self Care | Payer: Federal, State, Local not specified - PPO | Source: Ambulatory Visit | Attending: Surgery | Admitting: Surgery

## 2022-06-02 ENCOUNTER — Encounter: Payer: Self-pay | Admitting: Surgery

## 2022-06-02 ENCOUNTER — Ambulatory Visit (INDEPENDENT_AMBULATORY_CARE_PROVIDER_SITE_OTHER): Payer: Federal, State, Local not specified - PPO | Admitting: Surgery

## 2022-06-02 ENCOUNTER — Other Ambulatory Visit: Payer: Self-pay

## 2022-06-02 VITALS — BP 133/80 | HR 73 | Temp 98.3°F | Resp 20 | Ht 74.0 in | Wt 301.8 lb

## 2022-06-02 DIAGNOSIS — N179 Acute kidney failure, unspecified: Secondary | ICD-10-CM | POA: Insufficient documentation

## 2022-06-02 DIAGNOSIS — N186 End stage renal disease: Secondary | ICD-10-CM

## 2022-06-02 DIAGNOSIS — Z992 Dependence on renal dialysis: Secondary | ICD-10-CM | POA: Diagnosis not present

## 2022-06-02 NOTE — Progress Notes (Signed)
Vascular and Vein Specialist of [facility]  Patient name: Joshua Case MRN: [id number] DOB: February 05, 1961 Sex: male   REQUESTING PROVIDER:    Dr. Candiss Norse   REASON FOR CONSULT:    Dialysis access  HISTORY OF PRESENT ILLNESS:   Joshua Case is a 61 y.o. male, who is referred for dialysis access.  His renal failure secondary to diabetes and hypertension.  He is now dialyzing through a catheter on Tuesday Thursday Saturday.  He is right-handed.  He has not had a ICD or pacemaker placed  Patient has a history of ischemic stroke.  He suffers from morbid obesity, rheumatoid arthritis, and hyperlipidemia.  He is followed by hematology for monoclonal gammopathy  PAST MEDICAL HISTORY    Past Medical History:  Diagnosis Date   CKD (chronic kidney disease)    Diabetes mellitus without complication ([place])    Hypertension    Stroke ([place])      FAMILY HISTORY   Family History  Problem Relation Age of Onset   Heart disease Mother    Diabetes Father    Hypertension Father    Diabetes Daughter    Hypertension Daughter     SOCIAL HISTORY:   Social History   Socioeconomic History   Marital status: Married    Spouse name: Not on file   Number of children: Not on file   Years of education: Not on file   Highest education level: Not on file  Occupational History   Not on file  Tobacco Use   Smoking status: Never   Smokeless tobacco: Never  Vaping Use   Vaping Use: Every day  Substance and Sexual Activity   Alcohol use: Yes    Comment: occasional   Drug use: Yes    Types: Marijuana   Sexual activity: Not on file  Other Topics Concern   Not on file  Social History Narrative   Not on file   Social Determinants of Health   Financial Resource Strain: Not on file  Food Insecurity: Not on file  Transportation Needs: Not on file  Physical Activity: Not on file  Stress: Not on file  Social Connections: Not on file  Intimate Partner  Violence: Not on file    ALLERGIES:    Allergies  Allergen Reactions   Victoza [Liraglutide] Swelling    CURRENT MEDICATIONS:    Current Outpatient Medications  Medication Sig Dispense Refill   amLODipine (NORVASC) 10 MG tablet Take 10 mg by mouth daily.     furosemide (LASIX) 40 MG tablet Take 40 mg by mouth 2 (two) times daily.     gabapentin (NEURONTIN) 300 MG capsule Take 1 capsule (300 mg total) by mouth at bedtime. 30 capsule 3   Insulin Pen Needle (B-D UF III MINI PEN NEEDLES) 31G X 5 MM MISC 1 each by Does not apply route daily. 100 each 5   metoprolol succinate (TOPROL-XL) 100 MG 24 hr tablet Take 100 mg by mouth 2 (two) times daily.     sodium bicarbonate 650 MG tablet Take 650 mg by mouth 3 (three) times daily.     tirzepatide (MOUNJARO) 10 MG/0.5ML Pen Inject 10 mg into the skin once a week.     No current facility-administered medications for this visit.    REVIEW OF SYSTEMS:   '[X]'$  denotes positive finding, '[ ]'$  denotes negative finding Cardiac  Comments:  Chest pain or chest pressure:    Shortness of breath upon exertion:    Short of breath when  lying flat:    Irregular heart rhythm:        Vascular    Pain in calf, thigh, or hip brought on by ambulation:    Pain in feet at night that wakes you up from your sleep:     Blood clot in your veins:    Leg swelling:         Pulmonary    Oxygen at home:    Productive cough:     Wheezing:         Neurologic    Sudden weakness in arms or legs:     Sudden numbness in arms or legs:     Sudden onset of difficulty speaking or slurred speech:    Temporary loss of vision in one eye:     Problems with dizziness:         Gastrointestinal    Blood in stool:      Vomited blood:         Genitourinary    Burning when urinating:     Blood in urine:        Psychiatric    Major depression:         Hematologic    Bleeding problems:    Problems with blood clotting too easily:        Skin    Rashes or ulcers:         Constitutional    Fever or chills:     PHYSICAL EXAM:   Vitals:   06/02/22 1112  BP: 133/80  Pulse: 73  Resp: 20  Temp: 98.3 F (36.8 C)  SpO2: 97%  Weight: (!) 301 lb 12.8 oz (136.9 kg)  Height: '6\' 2"'$  (1.88 m)    GENERAL: The patient is a well-nourished male, in no acute distress. The vital signs are documented above. CARDIAC: There is a regular rate and rhythm.  VASCULAR: Palpable left radial and brachial pulse PULMONARY: Nonlabored respirations MUSCULOSKELETAL: There are no major deformities or cyanosis. NEUROLOGIC: No focal weakness or paresthesias are detected. SKIN: There are no ulcers or rashes noted. PSYCHIATRIC: The patient has a normal affect.  STUDIES:   I reviewed his vein mapping with the following findings: Right Cephalic   Diameter (cm)Depth (cm)Findings   +-----------------+-------------+----------+---------+  Shoulder             0.51                          +-----------------+-------------+----------+---------+  Prox upper arm       0.55                          +-----------------+-------------+----------+---------+  Mid upper arm        0.55                          +-----------------+-------------+----------+---------+  Dist upper arm       0.67                          +-----------------+-------------+----------+---------+  Antecubital fossa    1.00                          +-----------------+-------------+----------+---------+  Prox forearm         0.54                          +-----------------+-------------+----------+---------+  Mid forearm          0.41               branching  +-----------------+-------------+----------+---------+  Dist forearm         0.39               branching  +-----------------+-------------+----------+---------+   +-----------------+-------------+----------+--------+  Right Basilic    Diameter (cm)Depth (cm)Findings   +-----------------+-------------+----------+--------+  Mid upper arm        0.55                         +-----------------+-------------+----------+--------+  Dist upper arm       0.41                         +-----------------+-------------+----------+--------+  Antecubital fossa    0.39                         +-----------------+-------------+----------+--------+   +-----------------+-------------+----------+--------+  Left Cephalic    Diameter (cm)Depth (cm)Findings  +-----------------+-------------+----------+--------+  Shoulder             0.39                         +-----------------+-------------+----------+--------+  Prox upper arm       0.42                         +-----------------+-------------+----------+--------+  Mid upper arm        0.42                         +-----------------+-------------+----------+--------+  Dist upper arm       0.33                         +-----------------+-------------+----------+--------+  Antecubital fossa    0.37                         +-----------------+-------------+----------+--------+  Prox forearm         0.44                         +-----------------+-------------+----------+--------+  Mid forearm          0.40                         +-----------------+-------------+----------+--------+  Dist forearm         0.38                         +-----------------+-------------+----------+--------+   +-----------------+-------------+----------+--------+  Left Basilic     Diameter (cm)Depth (cm)Findings  +-----------------+-------------+----------+--------+  Mid upper arm        0.46                         +-----------------+-------------+----------+--------+  Dist upper arm       0.52                         +-----------------+-------------+----------+--------+  Antecubital fossa    0.40                          +-----------------+-------------+----------+--------+  ASSESSMENT and PLAN   End-stage renal disease: Based off the patient's vein mapping, I think he would be an excellent candidate for a left arm fistula.  I suspect he will be able to have a left radiocephalic fistula, however this will depend on how calcified his artery looks at the time of surgery.  I discussed a radiocephalic versus a brachiocephalic fistula.  I also talked about the need for future surgeries as well as the risk for nonmaturation and steal syndrome.  All of his questions were answered.  I will get him scheduled for a left arm fistula on a nondialysis day in the immediate future.   Leia Alf, MD, FACS [facility] Specialists of [facility] [phone] Pager [phone]

## 2022-06-02 NOTE — H&P (View-Only) (Signed)
Vascular and Vein Specialist of [facility]  Patient name: Joshua Case MRN: [id number] DOB: [birth year] Sex: male   REQUESTING PROVIDER:    Dr. Candiss Norse   REASON FOR CONSULT:    Dialysis access  HISTORY OF PRESENT ILLNESS:   Joshua Case is a 61 y.o. male, who is referred for dialysis access.  His renal failure secondary to diabetes and hypertension.  He is now dialyzing through a catheter on Tuesday Thursday Saturday.  He is right-handed.  He has not had a ICD or pacemaker placed  Patient has a history of ischemic stroke.  He suffers from morbid obesity, rheumatoid arthritis, and hyperlipidemia.  He is followed by hematology for monoclonal gammopathy  PAST MEDICAL HISTORY    Past Medical History:  Diagnosis Date   CKD (chronic kidney disease)    Diabetes mellitus without complication ([place])    Hypertension    Stroke ([place])      FAMILY HISTORY   Family History  Problem Relation Age of Onset   Heart disease Mother    Diabetes Father    Hypertension Father    Diabetes Daughter    Hypertension Daughter     SOCIAL HISTORY:   Social History   Socioeconomic History   Marital status: Married    Spouse name: Not on file   Number of children: Not on file   Years of education: Not on file   Highest education level: Not on file  Occupational History   Not on file  Tobacco Use   Smoking status: Never   Smokeless tobacco: Never  Vaping Use   Vaping Use: Every day  Substance and Sexual Activity   Alcohol use: Yes    Comment: occasional   Drug use: Yes    Types: Marijuana   Sexual activity: Not on file  Other Topics Concern   Not on file  Social History Narrative   Not on file   Social Determinants of Health   Financial Resource Strain: Not on file  Food Insecurity: Not on file  Transportation Needs: Not on file  Physical Activity: Not on file  Stress: Not on file  Social Connections: Not on file  Intimate Partner  Violence: Not on file    ALLERGIES:    Allergies  Allergen Reactions   Victoza [Liraglutide] Swelling    CURRENT MEDICATIONS:    Current Outpatient Medications  Medication Sig Dispense Refill   amLODipine (NORVASC) 10 MG tablet Take 10 mg by mouth daily.     furosemide (LASIX) 40 MG tablet Take 40 mg by mouth 2 (two) times daily.     gabapentin (NEURONTIN) 300 MG capsule Take 1 capsule (300 mg total) by mouth at bedtime. 30 capsule 3   Insulin Pen Needle (B-D UF III MINI PEN NEEDLES) 31G X 5 MM MISC 1 each by Does not apply route daily. 100 each 5   metoprolol succinate (TOPROL-XL) 100 MG 24 hr tablet Take 100 mg by mouth 2 (two) times daily.     sodium bicarbonate 650 MG tablet Take 650 mg by mouth 3 (three) times daily.     tirzepatide (MOUNJARO) 10 MG/0.5ML Pen Inject 10 mg into the skin once a week.     No current facility-administered medications for this visit.    REVIEW OF SYSTEMS:   '[X]'$  denotes positive finding, '[ ]'$  denotes negative finding Cardiac  Comments:  Chest pain or chest pressure:    Shortness of breath upon exertion:    Short of breath when  lying flat:    Irregular heart rhythm:        Vascular    Pain in calf, thigh, or hip brought on by ambulation:    Pain in feet at night that wakes you up from your sleep:     Blood clot in your veins:    Leg swelling:         Pulmonary    Oxygen at home:    Productive cough:     Wheezing:         Neurologic    Sudden weakness in arms or legs:     Sudden numbness in arms or legs:     Sudden onset of difficulty speaking or slurred speech:    Temporary loss of vision in one eye:     Problems with dizziness:         Gastrointestinal    Blood in stool:      Vomited blood:         Genitourinary    Burning when urinating:     Blood in urine:        Psychiatric    Major depression:         Hematologic    Bleeding problems:    Problems with blood clotting too easily:        Skin    Rashes or ulcers:         Constitutional    Fever or chills:     PHYSICAL EXAM:   Vitals:   06/02/22 1112  BP: 133/80  Pulse: 73  Resp: 20  Temp: 98.3 F (36.8 C)  SpO2: 97%  Weight: (!) 301 lb 12.8 oz (136.9 kg)  Height: '6\' 2"'$  (1.88 m)    GENERAL: The patient is a well-nourished male, in no acute distress. The vital signs are documented above. CARDIAC: There is a regular rate and rhythm.  VASCULAR: Palpable left radial and brachial pulse PULMONARY: Nonlabored respirations MUSCULOSKELETAL: There are no major deformities or cyanosis. NEUROLOGIC: No focal weakness or paresthesias are detected. SKIN: There are no ulcers or rashes noted. PSYCHIATRIC: The patient has a normal affect.  STUDIES:   I reviewed his vein mapping with the following findings: Right Cephalic   Diameter (cm)Depth (cm)Findings   +-----------------+-------------+----------+---------+  Shoulder             0.51                          +-----------------+-------------+----------+---------+  Prox upper arm       0.55                          +-----------------+-------------+----------+---------+  Mid upper arm        0.55                          +-----------------+-------------+----------+---------+  Dist upper arm       0.67                          +-----------------+-------------+----------+---------+  Antecubital fossa    1.00                          +-----------------+-------------+----------+---------+  Prox forearm         0.54                          +-----------------+-------------+----------+---------+  Mid forearm          0.41               branching  +-----------------+-------------+----------+---------+  Dist forearm         0.39               branching  +-----------------+-------------+----------+---------+   +-----------------+-------------+----------+--------+  Right Basilic    Diameter (cm)Depth (cm)Findings   +-----------------+-------------+----------+--------+  Mid upper arm        0.55                         +-----------------+-------------+----------+--------+  Dist upper arm       0.41                         +-----------------+-------------+----------+--------+  Antecubital fossa    0.39                         +-----------------+-------------+----------+--------+   +-----------------+-------------+----------+--------+  Left Cephalic    Diameter (cm)Depth (cm)Findings  +-----------------+-------------+----------+--------+  Shoulder             0.39                         +-----------------+-------------+----------+--------+  Prox upper arm       0.42                         +-----------------+-------------+----------+--------+  Mid upper arm        0.42                         +-----------------+-------------+----------+--------+  Dist upper arm       0.33                         +-----------------+-------------+----------+--------+  Antecubital fossa    0.37                         +-----------------+-------------+----------+--------+  Prox forearm         0.44                         +-----------------+-------------+----------+--------+  Mid forearm          0.40                         +-----------------+-------------+----------+--------+  Dist forearm         0.38                         +-----------------+-------------+----------+--------+   +-----------------+-------------+----------+--------+  Left Basilic     Diameter (cm)Depth (cm)Findings  +-----------------+-------------+----------+--------+  Mid upper arm        0.46                         +-----------------+-------------+----------+--------+  Dist upper arm       0.52                         +-----------------+-------------+----------+--------+  Antecubital fossa    0.40                          +-----------------+-------------+----------+--------+  ASSESSMENT and PLAN   End-stage renal disease: Based off the patient's vein mapping, I think he would be an excellent candidate for a left arm fistula.  I suspect he will be able to have a left radiocephalic fistula, however this will depend on how calcified his artery looks at the time of surgery.  I discussed a radiocephalic versus a brachiocephalic fistula.  I also talked about the need for future surgeries as well as the risk for nonmaturation and steal syndrome.  All of his questions were answered.  I will get him scheduled for a left arm fistula on a nondialysis day in the immediate future.   Leia Alf, MD, FACS [facility] Specialists of [facility] [phone] Pager [phone]

## 2022-06-19 ENCOUNTER — Encounter (HOSPITAL_COMMUNITY): Payer: Self-pay | Admitting: Surgery

## 2022-06-19 ENCOUNTER — Other Ambulatory Visit: Payer: Self-pay

## 2022-06-19 NOTE — Progress Notes (Signed)
Spoke with pt for pre-op call. Pt is at dialysis but was willing to do the call. He states he is diabetic but no longer takes medication. The last A1C I could find was 6.0 on 04/06/21. He says he doesn't have it checked anymore. Pt is treated for HTN. Denies cardiac history.  Pt is unable to shower due to the dialysis catheter. Instructed him to wash from the sink the best he can do with Dial soap.

## 2022-06-20 ENCOUNTER — Ambulatory Visit (HOSPITAL_COMMUNITY): Payer: Federal, State, Local not specified - PPO | Admitting: Certified Registered Nurse Anesthetist

## 2022-06-20 ENCOUNTER — Other Ambulatory Visit: Payer: Self-pay

## 2022-06-20 ENCOUNTER — Ambulatory Visit (HOSPITAL_COMMUNITY)
Admission: RE | Admit: 2022-06-20 | Discharge: 2022-06-20 | Disposition: A | Discharge status: Home / Self Care | Payer: Federal, State, Local not specified - PPO | Attending: Surgery | Admitting: Surgery

## 2022-06-20 ENCOUNTER — Encounter (HOSPITAL_COMMUNITY): Payer: Self-pay | Admitting: Surgery

## 2022-06-20 ENCOUNTER — Encounter (HOSPITAL_COMMUNITY)
Admission: RE | Disposition: A | Discharge status: Home / Self Care | Payer: Self-pay | Source: Home / Self Care | Attending: Surgery

## 2022-06-20 DIAGNOSIS — Z6837 Body mass index (BMI) 37.0-37.9, adult: Secondary | ICD-10-CM | POA: Diagnosis not present

## 2022-06-20 DIAGNOSIS — E669 Obesity, unspecified: Secondary | ICD-10-CM | POA: Diagnosis not present

## 2022-06-20 DIAGNOSIS — Z1152 Encounter for screening for COVID-19: Secondary | ICD-10-CM | POA: Diagnosis not present

## 2022-06-20 DIAGNOSIS — I120 Hypertensive chronic kidney disease with stage 5 chronic kidney disease or end stage renal disease: Secondary | ICD-10-CM | POA: Diagnosis not present

## 2022-06-20 DIAGNOSIS — E1122 Type 2 diabetes mellitus with diabetic chronic kidney disease: Secondary | ICD-10-CM | POA: Insufficient documentation

## 2022-06-20 DIAGNOSIS — N185 Chronic kidney disease, stage 5: Secondary | ICD-10-CM

## 2022-06-20 DIAGNOSIS — N186 End stage renal disease: Secondary | ICD-10-CM

## 2022-06-20 HISTORY — PX: AV FISTULA PLACEMENT: SHX1204

## 2022-06-20 LAB — POCT I-STAT, CHEM 8
BUN: 24 mg/dL — ABNORMAL HIGH (ref 6–20)
Calcium, Ion: 1.03 mmol/L — ABNORMAL LOW (ref 1.15–1.40)
Chloride: 99 mmol/L (ref 98–111)
Creatinine, Ser: 4.8 mg/dL — ABNORMAL HIGH (ref 0.61–1.24)
Glucose, Bld: 179 mg/dL — ABNORMAL HIGH (ref 70–99)
HCT: 33 % — ABNORMAL LOW (ref 39.0–52.0)
Hemoglobin: 11.2 g/dL — ABNORMAL LOW (ref 13.0–17.0)
Potassium: 4 mmol/L (ref 3.5–5.1)
Sodium: 137 mmol/L (ref 135–145)
TCO2: 27 mmol/L (ref 22–32)

## 2022-06-20 LAB — SARS CORONAVIRUS 2 BY RT PCR: SARS Coronavirus 2 by RT PCR: NEGATIVE

## 2022-06-20 LAB — GLUCOSE, CAPILLARY
Glucose-Capillary: 176 mg/dL — ABNORMAL HIGH (ref 70–99)
Glucose-Capillary: 152 mg/dL — ABNORMAL HIGH (ref 70–99)

## 2022-06-20 SURGERY — ARTERIOVENOUS (AV) FISTULA CREATION
Anesthesia: Monitor Anesthesia Care | Laterality: Left

## 2022-06-20 MED ORDER — HEPARIN 6000 UNIT IRRIGATION SOLUTION
Status: AC
  Filled 2022-06-20: qty 500

## 2022-06-20 MED ORDER — FENTANYL CITRATE (PF) 250 MCG/5ML IJ SOLN
INTRAMUSCULAR | Status: AC
  Filled 2022-06-20: qty 5

## 2022-06-20 MED ORDER — MIDAZOLAM HCL 2 MG/2ML IJ SOLN
INTRAMUSCULAR | Status: AC
  Administered 2022-06-20: 1 mg via INTRAVENOUS
  Filled 2022-06-20: qty 2

## 2022-06-20 MED ORDER — FENTANYL CITRATE (PF) 100 MCG/2ML IJ SOLN
INTRAMUSCULAR | Status: AC
  Filled 2022-06-20: qty 2

## 2022-06-20 MED ORDER — CEFAZOLIN SODIUM-DEXTROSE 2-4 GM/100ML-% IV SOLN
INTRAVENOUS | Status: AC
  Filled 2022-06-20: qty 100

## 2022-06-20 MED ORDER — CEFAZOLIN SODIUM-DEXTROSE 2-4 GM/100ML-% IV SOLN
2.0000 g | INTRAVENOUS | Status: AC
  Administered 2022-06-20: 2 g via INTRAVENOUS

## 2022-06-20 MED ORDER — FENTANYL CITRATE (PF) 250 MCG/5ML IJ SOLN
INTRAMUSCULAR | Status: DC | PRN
  Administered 2022-06-20: 50 ug via INTRAVENOUS

## 2022-06-20 MED ORDER — PROPOFOL 500 MG/50ML IV EMUL
INTRAVENOUS | Status: DC | PRN
  Administered 2022-06-20: 100 ug/kg/min via INTRAVENOUS

## 2022-06-20 MED ORDER — HEPARIN 6000 UNIT IRRIGATION SOLUTION
Status: DC | PRN
  Administered 2022-06-20: 1

## 2022-06-20 MED ORDER — CHLORHEXIDINE GLUCONATE 4 % EX LIQD: 60.0000 mL | Freq: Once | CUTANEOUS | Status: DC

## 2022-06-20 MED ORDER — MEPIVACAINE HCL (PF) 2 % IJ SOLN
INTRAMUSCULAR | Status: DC | PRN
  Administered 2022-06-20: 20 mL

## 2022-06-20 MED ORDER — PROTAMINE SULFATE 10 MG/ML IV SOLN
INTRAVENOUS | Status: AC
  Filled 2022-06-20: qty 10

## 2022-06-20 MED ORDER — CHLORHEXIDINE GLUCONATE 0.12 % MT SOLN
OROMUCOSAL | Status: AC
  Administered 2022-06-20: 15 mL via OROMUCOSAL
  Filled 2022-06-20: qty 15

## 2022-06-20 MED ORDER — SODIUM CHLORIDE 0.9 % IV SOLN: INTRAVENOUS | Status: DC

## 2022-06-20 MED ORDER — OXYCODONE-ACETAMINOPHEN 5-325 MG PO TABS: 1.0000 | ORAL_TABLET | Freq: Four times a day (QID) | ORAL | 0 refills | Status: DC | PRN

## 2022-06-20 MED ORDER — CHLORHEXIDINE GLUCONATE 0.12 % MT SOLN: 15.0000 mL | Freq: Once | OROMUCOSAL | Status: AC

## 2022-06-20 MED ORDER — FENTANYL CITRATE (PF) 100 MCG/2ML IJ SOLN
INTRAMUSCULAR | Status: AC
  Administered 2022-06-20: 50 ug via INTRAVENOUS
  Filled 2022-06-20: qty 2

## 2022-06-20 MED ORDER — LIDOCAINE 2% (20 MG/ML) 5 ML SYRINGE
INTRAMUSCULAR | Status: AC
  Filled 2022-06-20: qty 15

## 2022-06-20 MED ORDER — GLYCOPYRROLATE PF 0.2 MG/ML IJ SOSY
PREFILLED_SYRINGE | INTRAMUSCULAR | Status: DC | PRN
  Administered 2022-06-20: .2 mg via INTRAVENOUS

## 2022-06-20 MED ORDER — MIDAZOLAM HCL 2 MG/2ML IJ SOLN: 1.0000 mg | Freq: Once | INTRAMUSCULAR | Status: AC

## 2022-06-20 MED ORDER — INSULIN ASPART 100 UNIT/ML IJ SOLN: 0.0000 [IU] | INTRAMUSCULAR | Status: DC | PRN

## 2022-06-20 MED ORDER — DEXAMETHASONE SODIUM PHOSPHATE 10 MG/ML IJ SOLN
INTRAMUSCULAR | Status: AC
  Filled 2022-06-20: qty 1

## 2022-06-20 MED ORDER — ACETAMINOPHEN 10 MG/ML IV SOLN: 1000.0000 mg | Freq: Once | INTRAVENOUS | Status: DC | PRN

## 2022-06-20 MED ORDER — LIDOCAINE-EPINEPHRINE (PF) 1 %-1:200000 IJ SOLN
INTRAMUSCULAR | Status: AC
  Filled 2022-06-20: qty 30

## 2022-06-20 MED ORDER — PROPOFOL 1000 MG/100ML IV EMUL
INTRAVENOUS | Status: AC
  Filled 2022-06-20: qty 100

## 2022-06-20 MED ORDER — 0.9 % SODIUM CHLORIDE (POUR BTL) OPTIME
TOPICAL | Status: DC | PRN
  Administered 2022-06-20: 1000 mL

## 2022-06-20 MED ORDER — FENTANYL CITRATE (PF) 100 MCG/2ML IJ SOLN: 50.0000 ug | Freq: Once | INTRAMUSCULAR | Status: AC

## 2022-06-20 MED ORDER — ORAL CARE MOUTH RINSE: 15.0000 mL | Freq: Once | OROMUCOSAL | Status: AC

## 2022-06-20 MED ORDER — LACTATED RINGERS IV SOLN: INTRAVENOUS | Status: DC

## 2022-06-20 MED ORDER — PHENYLEPHRINE 80 MCG/ML (10ML) SYRINGE FOR IV PUSH (FOR BLOOD PRESSURE SUPPORT)
PREFILLED_SYRINGE | INTRAVENOUS | Status: AC
  Filled 2022-06-20: qty 20

## 2022-06-20 MED ORDER — SUCCINYLCHOLINE CHLORIDE 200 MG/10ML IV SOSY
PREFILLED_SYRINGE | INTRAVENOUS | Status: AC
  Filled 2022-06-20: qty 10

## 2022-06-20 MED ORDER — HEPARIN SODIUM (PORCINE) 1000 UNIT/ML IJ SOLN
INTRAMUSCULAR | Status: AC
  Filled 2022-06-20: qty 20

## 2022-06-20 MED ORDER — FENTANYL CITRATE (PF) 100 MCG/2ML IJ SOLN
25.0000 ug | INTRAMUSCULAR | Status: DC | PRN
  Administered 2022-06-20: 25 ug via INTRAVENOUS

## 2022-06-20 MED ORDER — ONDANSETRON HCL 4 MG/2ML IJ SOLN
INTRAMUSCULAR | Status: AC
  Filled 2022-06-20: qty 2

## 2022-06-20 MED ORDER — METOPROLOL TARTRATE 5 MG/5ML IV SOLN
INTRAVENOUS | Status: DC | PRN
  Administered 2022-06-20 (×2): 1 mg via INTRAVENOUS

## 2022-06-20 MED ORDER — MIDAZOLAM HCL 2 MG/2ML IJ SOLN
INTRAMUSCULAR | Status: AC
  Filled 2022-06-20: qty 2

## 2022-06-20 SURGICAL SUPPLY — 35 items
ADH SKN CLS APL DERMABOND .7 (GAUZE/BANDAGES/DRESSINGS) ×1
ADH SKN CLS LQ APL DERMABOND (GAUZE/BANDAGES/DRESSINGS) ×1
ARMBAND PINK RESTRICT EXTREMIT (MISCELLANEOUS) ×2 IMPLANT
BAG COUNTER SPONGE SURGICOUNT (BAG) ×1 IMPLANT
BAG SPNG CNTER NS LX DISP (BAG) ×1
CANISTER SUCT 3000ML PPV (MISCELLANEOUS) ×1 IMPLANT
CLIP VESOCCLUDE MED 6/CT (CLIP) ×1 IMPLANT
CLIP VESOCCLUDE SM WIDE 6/CT (CLIP) ×1 IMPLANT
COVER PROBE W GEL 5X96 (DRAPES) ×1 IMPLANT
DERMABOND ADVANCED .7 DNX12 (GAUZE/BANDAGES/DRESSINGS) ×1 IMPLANT
DERMABOND ADVANCED .7 DNX6 (GAUZE/BANDAGES/DRESSINGS) IMPLANT
ELECT REM PT RETURN 9FT ADLT (ELECTROSURGICAL) ×1
ELECTRODE REM PT RTRN 9FT ADLT (ELECTROSURGICAL) ×1 IMPLANT
GLOVE SURG SS PI 7.5 STRL IVOR (GLOVE) ×3 IMPLANT
GOWN STRL REUS W/ TWL LRG LVL3 (GOWN DISPOSABLE) ×2 IMPLANT
GOWN STRL REUS W/ TWL XL LVL3 (GOWN DISPOSABLE) ×1 IMPLANT
GOWN STRL REUS W/TWL LRG LVL3 (GOWN DISPOSABLE) ×2
GOWN STRL REUS W/TWL XL LVL3 (GOWN DISPOSABLE) ×1
HEMOSTAT SNOW SURGICEL 2X4 (HEMOSTASIS) IMPLANT
KIT BASIN OR (CUSTOM PROCEDURE TRAY) ×1 IMPLANT
KIT TURNOVER KIT B (KITS) ×1 IMPLANT
LOOP VESSEL MINI RED (MISCELLANEOUS) IMPLANT
MARKER SKIN DUAL TIP RULER LAB (MISCELLANEOUS) IMPLANT
NS IRRIG 1000ML POUR BTL (IV SOLUTION) ×1 IMPLANT
PACK CV ACCESS (CUSTOM PROCEDURE TRAY) ×1 IMPLANT
PAD ARMBOARD 7.5X6 YLW CONV (MISCELLANEOUS) ×2 IMPLANT
SLING ARM FOAM STRAP LRG (SOFTGOODS) IMPLANT
SLING ARM FOAM STRAP MED (SOFTGOODS) IMPLANT
SUT PROLENE 6 0 CC (SUTURE) ×1 IMPLANT
SUT VIC AB 3-0 SH 27 (SUTURE) ×1
SUT VIC AB 3-0 SH 27X BRD (SUTURE) ×1 IMPLANT
SUT VICRYL 4-0 PS2 18IN ABS (SUTURE) ×1 IMPLANT
TOWEL GREEN STERILE (TOWEL DISPOSABLE) ×1 IMPLANT
UNDERPAD 30X36 HEAVY ABSORB (UNDERPADS AND DIAPERS) ×1 IMPLANT
WATER STERILE IRR 1000ML POUR (IV SOLUTION) ×1 IMPLANT

## 2022-06-20 NOTE — Anesthesia Preprocedure Evaluation (Addendum)
Anesthesia Evaluation  Patient identified by MRN, date of birth, ID band Patient awake    Reviewed: Allergy & Precautions, NPO status , Patient's Chart, lab work & pertinent test results  Airway Mallampati: III  TM Distance: >3 FB Neck ROM: Full    Dental no notable dental hx.    Pulmonary Patient abstained from smoking.,    Pulmonary exam normal        Cardiovascular hypertension, Pt. on medications and Pt. on home beta blockers  Rhythm:Regular Rate:Normal     Neuro/Psych CVA, No Residual Symptoms negative psych ROS   GI/Hepatic negative GI ROS, Neg liver ROS,   Endo/Other  diabetes  Renal/GU   negative genitourinary   Musculoskeletal negative musculoskeletal ROS (+)   Abdominal (+) + obese,   Peds  Hematology negative hematology ROS (+)   Anesthesia Other Findings   Reproductive/Obstetrics                            Anesthesia Physical Anesthesia Plan  ASA: 3  Anesthesia Plan: MAC and Regional   Post-op Pain Management:    Induction: Intravenous  PONV Risk Score and Plan: 1 and Ondansetron, Dexamethasone, Propofol infusion, Treatment may vary due to age or medical condition and Midazolam  Airway Management Planned: Simple Face Mask, Natural Airway and Nasal Cannula  Additional Equipment: None  Intra-op Plan:   Post-operative Plan:   Informed Consent: I have reviewed the patients History and Physical, chart, labs and discussed the procedure including the risks, benefits and alternatives for the proposed anesthesia with the patient or authorized representative who has indicated his/her understanding and acceptance.     Dental advisory given  Plan Discussed with: CRNA  Anesthesia Plan Comments:         Anesthesia Quick Evaluation

## 2022-06-20 NOTE — Interval H&P Note (Signed)
History and Physical Interval Note:  06/20/2022 12:30 PM  Joshua Case  has presented today for surgery, with the diagnosis of ESRD.  The various methods of treatment have been discussed with the patient and family. After consideration of risks, benefits and other options for treatment, the patient has consented to  Procedure(s): LEFT ARM FISTULA CREATION (Left) as a surgical intervention.  The patient's history has been reviewed, patient examined, no change in status, stable for surgery.  I have reviewed the patient's chart and labs.  Questions were answered to the patient's satisfaction.     [name]

## 2022-06-20 NOTE — Anesthesia Procedure Notes (Signed)
Procedure Name: MAC Date/Time: 06/20/2022 1:15 PM  Performed by: Moshe Salisbury, CRNAPre-anesthesia Checklist: Patient identified, Emergency Drugs available, Suction available, Patient being monitored and Timeout performed Patient Re-evaluated:Patient Re-evaluated prior to induction Oxygen Delivery Method: Simple face mask Placement Confirmation: positive ETCO2 Dental Injury: Teeth and Oropharynx as per pre-operative assessment

## 2022-06-20 NOTE — Anesthesia Procedure Notes (Signed)
Anesthesia Regional Block: Supraclavicular block   Pre-Anesthetic Checklist: , timeout performed,  Correct Patient, Correct Site, Correct Laterality,  Correct Procedure, Correct Position, site marked,  Risks and benefits discussed,  Surgical consent,  Pre-op evaluation,  At surgeon's request and post-op pain management  Laterality: Left  Prep: Dura Prep       Needles:  Injection technique: Single-shot  Needle Type: Echogenic Stimulator Needle     Needle Length: 5cm  Needle Gauge: 20     Additional Needles:   Procedures:,,,, ultrasound used (permanent image in chart),,    Narrative:  Start time: 06/20/2022 12:53 PM End time: 06/20/2022 12:56 PM Injection made incrementally with aspirations every 5 mL.  Performed by: Personally  Anesthesiologist: Darral Dash, DO  Additional Notes: Patient identified. Risks/Benefits/Options discussed with patient including but not limited to bleeding, infection, nerve damage, failed block, incomplete pain control. Patient expressed understanding and wished to proceed. All questions were answered. Sterile technique was used throughout the entire procedure. Please see nursing notes for vital signs. Aspirated in 5cc intervals with injection for negative confirmation. Patient was given instructions on fall risk and not to get out of bed. All questions and concerns addressed with instructions to call with any issues or inadequate analgesia.

## 2022-06-20 NOTE — Discharge Instructions (Signed)
[facility]  Discharge Instructions  AV Fistula or Graft Surgery for Dialysis Access  Please refer to the following instructions for your post-procedure care. Your surgeon or physician assistant will discuss any changes with you.  Activity  You may drive the day following your surgery, if you are comfortable and no longer taking prescription pain medication. Resume full activity as the soreness in your incision resolves.  Bathing/Showering  You may shower after you go home. Keep your incision dry for 48 hours. Do not soak in a bathtub, hot tub, or swim until the incision heals completely. You may not shower if you have a hemodialysis catheter.  Incision Care  Clean your incision with mild soap and water after 48 hours. Pat the area dry with a clean towel. You do not need a bandage unless otherwise instructed. Do not apply any ointments or creams to your incision. You may have skin glue on your incision. Do not peel it off. It will come off on its own in about one week. Your arm may swell a bit after surgery. To reduce swelling use pillows to elevate your arm so it is above your heart. Your doctor will tell you if you need to lightly wrap your arm with an ACE bandage.  Diet  Resume your normal diet. There are not special food restrictions following this procedure. In order to heal from your surgery, it is CRITICAL to get adequate nutrition. Your body requires vitamins, minerals, and protein. Vegetables are the best source of vitamins and minerals. Vegetables also provide the perfect balance of protein. Processed food has Krauser nutritional value, so try to avoid this.  Medications  Resume taking all of your medications. If your incision is causing pain, you may take over-the counter pain relievers such as acetaminophen (Tylenol). If you were prescribed a stronger pain medication, please be aware these medications can cause nausea and constipation. Prevent  nausea by taking the medication with a snack or meal. Avoid constipation by drinking plenty of fluids and eating foods with high amount of fiber, such as fruits, vegetables, and grains.  Do not take Tylenol if you are taking prescription pain medications.  Follow up Your surgeon may want to see you in the office following your access surgery. If so, this will be arranged at the time of your surgery.  Please call us immediately for any of the following conditions:  Increased pain, redness, drainage (pus) from your incision site Fever of 101 degrees or higher Severe or worsening pain at your incision site Hand pain or numbness.  Reduce your risk of vascular disease:  Stop smoking. If you would like help, call [facility] at 1-800-QUIT-NOW 5797353432) or [place] at [place] your cholesterol Maintain a desired weight Control your diabetes Keep your blood pressure down  Dialysis  It will take several weeks to several months for your new dialysis access to be ready for use. Your surgeon will determine when it is okay to use it. Your nephrologist will continue to direct your dialysis. You can continue to use your Permcath until your new access is ready for use.   06/20/2022 Joshua Case [id number] February 08, 1961  Surgeon(s): Serafina Mitchell, MD  Procedure(s): LEFT RADIOCEPHALIC ARM FISTULA CREATION   May stick graft immediately   May stick graft on designated area only:   x Do not stick fistula for 12 weeks    If you have any questions, please call the office at [phone].

## 2022-06-20 NOTE — Transfer of Care (Signed)
Immediate Anesthesia Transfer of Care Note  Patient: Joshua Case  Procedure(s) Performed: LEFT RADIOCEPHALIC ARM FISTULA CREATION (Left)  Patient Location: PACU  Anesthesia Type:MAC combined with regional for post-op pain  Level of Consciousness: drowsy and patient cooperative  Airway & Oxygen Therapy: Patient Spontanous Breathing and Patient connected to face mask oxygen  Post-op Assessment: Report given to RN and Post -op Vital signs reviewed and stable  Post vital signs: Reviewed and stable  Last Vitals:  Vitals Value Taken Time  BP 131/85 06/20/22 1436  Temp 36.9 C 06/20/22 1436  Pulse 76 06/20/22 1440  Resp 17 06/20/22 1440  SpO2 100 % 06/20/22 1440  Vitals shown include unvalidated device data.  Last Pain:  Vitals:   06/20/22 1255  TempSrc:   PainSc: 0-No pain         Complications: No notable events documented.

## 2022-06-23 ENCOUNTER — Telehealth: Payer: Self-pay | Admitting: Physician Assistant

## 2022-06-23 ENCOUNTER — Encounter (HOSPITAL_COMMUNITY): Payer: Self-pay | Admitting: Surgery

## 2022-06-23 NOTE — Anesthesia Postprocedure Evaluation (Signed)
Anesthesia Post Note  Patient: Joshua Case  Procedure(s) Performed: LEFT RADIOCEPHALIC ARM FISTULA CREATION (Left)     Patient location during evaluation: PACU Anesthesia Type: Regional and MAC Level of consciousness: awake and alert Pain management: pain level controlled Vital Signs Assessment: post-procedure vital signs reviewed and stable Respiratory status: spontaneous breathing, nonlabored ventilation, respiratory function stable and patient connected to nasal cannula oxygen Cardiovascular status: stable and blood pressure returned to baseline Postop Assessment: no apparent nausea or vomiting Anesthetic complications: no   No notable events documented.  Last Vitals:  Vitals:   06/20/22 1515 06/20/22 1530  BP: (!) 153/86 (!) 146/87  Pulse: 74 71  Resp: (!) 23 11  Temp:    SpO2: 100% 100%    Last Pain:  Vitals:   06/20/22 1530  TempSrc:   PainSc: 2                  March Rumm[place] Droll

## 2022-06-23 NOTE — Telephone Encounter (Signed)
-----   Message from [facility], [place] sent at 06/20/2022  2:36 PM EDT ----- L radiocephalic fistula creation, 10/20  Follow up with PA on Dr.Brabham office day with dialysis access duplex in 6 weeks

## 2022-06-23 NOTE — Op Note (Signed)
    Patient name: DORSE LOCY MRN: [id number] DOB: April 23, 1961 Sex: male  06/20/2022 Pre-operative Diagnosis: ESRD Post-operative diagnosis:  Same Surgeon:  [name] Assistants:  [place] Bing, PA Procedure:   Left radiocephalic fistula Anesthesia:  regional Blood Loss:  minimal Specimens:  none  Findings: Excellent left cephalic vein  Indications: This is a 61 year old gentleman in need of permanent access.  He is right-handed.  Vein mapping shows Left cephalic vein  Procedure:  The patient was identified in the holding area and taken to [place] 16  The patient was then placed supine on the table. regional anesthesia was administered.  The patient was prepped and draped in the usual sterile fashion.  A time out was called and antibiotics were administered.  A PA was necessary to expedite the procedure and assist with technical details.  She help with exposure by providing suction and retraction as well as following the suture on the anastomosis.  She also help with wound closure.  Ultrasound was used to evaluate the cephalic vein from the wrist to the shoulder.  It was greater than 4 mm throughout.  In addition the radial artery appeared to be disease-free.  Therefore elected to proceed with a left radiocephalic fistula.  A longitudinal incision was made just proximal to the wrist.  I first dissected out the cephalic vein which measured at least 4 mm.  It was fully mobilized.  There were multiple branches that were ligated between silk ties.  I then dissected out the radial artery which was a 3 mm disease-free artery.  This was encircled proximally distally with Vesseloops.  Next, the vein was marked for orientation and ligated distally with a 2-0 silk tie.  It distended nicely with heparinized saline.  I then occluded the radial artery with vascular clamps and used an 11 blade to make an arteriotomy which was extended longitudinally with Potts scissors.  The vein was then cut to the  appropriate length and spatulated to fit the size of the arteriotomy.  A running anastomosis was created with 6-0 Prolene.  Prior to completion, the appropriate flushing maneuvers were performed and the anastomosis was completed.  Blood flow was then reestablished to the fistula.  I inspected the course of the vein to make sure there were no kinks.  There is a good thrill within the fistula and brisk radial and ulnar Doppler signals distally.  The wound was then irrigated.  Hemostasis was achieved.  The deep tissue was reapproximated with 3-0 Vicryl, and the skin was closed with 4-0 Vicryl followed by Dermabond.  There were no immediate complications.  Disposition: To PACU stable.   Theotis Burrow, M.D., [facility] [place] Office: [phone] Pager:  [phone]

## 2022-07-07 ENCOUNTER — Other Ambulatory Visit: Payer: Self-pay | Admitting: Physician Assistant

## 2022-07-07 DIAGNOSIS — D472 Monoclonal gammopathy: Secondary | ICD-10-CM

## 2022-07-08 ENCOUNTER — Inpatient Hospital Stay: Payer: Federal, State, Local not specified - PPO | Admitting: Physician Assistant

## 2022-07-08 ENCOUNTER — Inpatient Hospital Stay: Payer: Federal, State, Local not specified - PPO | Attending: Physician Assistant

## 2022-07-09 ENCOUNTER — Ambulatory Visit: Payer: Federal, State, Local not specified - PPO | Admitting: Podiatry

## 2022-07-17 ENCOUNTER — Other Ambulatory Visit: Payer: Self-pay | Admitting: *Deleted

## 2022-07-17 DIAGNOSIS — N186 End stage renal disease: Secondary | ICD-10-CM

## 2022-07-31 NOTE — Progress Notes (Unsigned)
    Postoperative Access Visit   History of Present Illness   Joshua Case is a 61 y.o. year old male who presents for postoperative follow-up for:  Left radiocephalic fistula 11/91/47 by Dr. Trula Slade. The patient's wounds are *** healed.  The patient notes *** steal symptoms.  The patient is *** able to complete their activities of daily living.  The patient's current symptoms are: ***.   Physical Examination  There were no vitals filed for this visit. There is no height or weight on file to calculate BMI.  left arm Incision is *** healed, *** radial pulse, hand grip is ***/5, sensation in digits is *** intact, ***palpable thrill, bruit can *** be auscultated    Non invasive vascular lab:   Medical Decision Making   Joshua Case is a 61 y.o. year old male who presents s/p Left radiocephalic fistula 82/95/62 by Dr. Trula Slade.   Patent *** without signs or symptoms of steal syndrome The patient's access will be ready for use *** ***The patient's tunneled dialysis catheter can be removed when Nephrology is comfortable with the performance of the *** The patient may follow up on a prn basis   Karoline Caldwell, PA-C [facility] [place]: [facility] MD: Trula Slade

## 2022-08-04 ENCOUNTER — Ambulatory Visit: Payer: Federal, State, Local not specified - PPO

## 2022-08-04 ENCOUNTER — Ambulatory Visit (HOSPITAL_COMMUNITY): Payer: Federal, State, Local not specified - PPO | Attending: Surgery

## 2022-08-04 DIAGNOSIS — N186 End stage renal disease: Secondary | ICD-10-CM

## 2022-09-03 ENCOUNTER — Encounter: Payer: Self-pay | Admitting: Podiatry

## 2022-09-03 ENCOUNTER — Ambulatory Visit (INDEPENDENT_AMBULATORY_CARE_PROVIDER_SITE_OTHER): Payer: Federal, State, Local not specified - PPO | Admitting: Podiatry

## 2022-09-03 VITALS — BP 159/84 | HR 100

## 2022-09-03 DIAGNOSIS — Z992 Dependence on renal dialysis: Secondary | ICD-10-CM | POA: Diagnosis not present

## 2022-09-03 DIAGNOSIS — L853 Xerosis cutis: Secondary | ICD-10-CM

## 2022-09-03 DIAGNOSIS — N186 End stage renal disease: Secondary | ICD-10-CM | POA: Diagnosis not present

## 2022-09-03 DIAGNOSIS — E0822 Diabetes mellitus due to underlying condition with diabetic chronic kidney disease: Secondary | ICD-10-CM

## 2022-09-03 DIAGNOSIS — M79675 Pain in left toe(s): Secondary | ICD-10-CM | POA: Diagnosis not present

## 2022-09-03 DIAGNOSIS — M79674 Pain in right toe(s): Secondary | ICD-10-CM | POA: Diagnosis not present

## 2022-09-03 DIAGNOSIS — B351 Tinea unguium: Secondary | ICD-10-CM | POA: Diagnosis not present

## 2022-09-03 DIAGNOSIS — M2141 Flat foot [pes planus] (acquired), right foot: Secondary | ICD-10-CM

## 2022-09-03 DIAGNOSIS — E119 Type 2 diabetes mellitus without complications: Secondary | ICD-10-CM

## 2022-09-03 DIAGNOSIS — M2142 Flat foot [pes planus] (acquired), left foot: Secondary | ICD-10-CM

## 2022-09-03 DIAGNOSIS — Q828 Other specified congenital malformations of skin: Secondary | ICD-10-CM

## 2022-09-03 MED ORDER — AMMONIUM LACTATE 12 % EX LOTN: 1.0000 | TOPICAL_LOTION | CUTANEOUS | 5 refills | Status: AC | PRN

## 2022-09-03 NOTE — Progress Notes (Signed)
Subjective: Joshua Case presents today for annual diabetic foot examination Chief Complaint  Patient presents with   foot care    Patient is here for diabetic foot care, he is diabetic, and he also has high blood pressure.patient states that his last A1c was 7.5 and his primary doctor is Joshua Case.   Patient relates 20 year h/o diabetes.  Patient denies any h/o foot wounds.  Risk factors: diabetes, h/o CVA, HTN, CKD, ESRD on hemodialysis, diabetic renal disease.  He states he attends dialysis treatments on TTS.  PCP is Joshua Landsman, MD.  Past Medical History:  Diagnosis Date   CKD (chronic kidney disease)    Diabetes mellitus without complication ([place])    Hypertension    Stroke [facility])     Patient Active Problem List   Diagnosis Date Noted   Monoclonal gammopathy 04/04/2022   Diabetic hypoglycemia ([place]) 08/07/2021   Hx of arterial ischemic stroke - 04-2021 08/07/2021   Acute CVA (cerebrovascular accident) ([place]) 04/05/2021   Hypertensive emergency 04/05/2021   AKI (acute kidney injury) ([place]) 04/05/2021   Dyspnea on exertion 04/05/2021   Diabetes ([place]) 04/05/2021   HAV (hallux abducto valgus) 10/20/2014   Bunion 10/20/2014   Plantar porokeratosis, acquired 10/20/2014   Lumbar disc disease with radiculopathy 07/13/2013   Back pain 04/02/2011   Physical exam 04/02/2011   DIAB W/RENAL MANIFESTS TYPE II/UNS TYPE UNCNTRL 10/15/2010   DYSLIPIDEMIA 10/15/2010   HYPERTENSION, ESSENTIAL, UNCONTROLLED 10/15/2010   MICROALBUMINURIA 10/15/2010   PERS HX NONCOMPLIANCE W/MED TX PRS HAZARDS HLTH 10/15/2010   Dyslipidemia 10/15/2010    Past Surgical History:  Procedure Laterality Date   AV FISTULA PLACEMENT Left 06/20/2022   Procedure: LEFT RADIOCEPHALIC ARM FISTULA CREATION;  Surgeon: Serafina Mitchell, MD;  Location: [facility] OR;  Service: Vascular;  Laterality: Left;   NO PAST SURGERIES      Current Outpatient Medications on File Prior to Visit  Medication Sig Dispense Refill    amLODipine (NORVASC) 10 MG tablet Take 10 mg by mouth daily.     furosemide (LASIX) 40 MG tablet Take 40 mg by mouth 2 (two) times daily.     gabapentin (NEURONTIN) 300 MG capsule Take 1 capsule (300 mg total) by mouth at bedtime. (Patient taking differently: Take 300 mg by mouth at bedtime as needed (pain).) 30 capsule 3   metoprolol succinate (TOPROL-XL) 100 MG 24 hr tablet Take 100 mg by mouth 2 (two) times daily.     oxyCODONE-acetaminophen (PERCOCET) 5-325 MG tablet Take 1 tablet by mouth every 6 (six) hours as needed for severe pain. 10 tablet 0   No current facility-administered medications on file prior to visit.     Allergies  Allergen Reactions   Joshua Case [Liraglutide] Swelling    Social History   Occupational History   Not on file  Tobacco Use   Smoking status: Never   Smokeless tobacco: Never  Vaping Use   Vaping Use: Every day  Substance and Sexual Activity   Alcohol use: Not Currently    Comment: occasional   Drug use: Yes    Types: Marijuana   Sexual activity: Not on file    Family History  Problem Relation Age of Onset   Heart disease Mother    Diabetes Father    Hypertension Father    Diabetes Daughter    Hypertension Daughter     Immunization History  Administered Date(s) Administered   Influenza,inj,Quad PF,6+ Mos 07/13/2013    Objective: Vitals:   09/03/22 1534  BP: (!) 159/84  Pulse: 100    Joshua Case is a pleasant 62 y.o. male morbidly obese in NAD. AAO X 3.  Vascular Examination: CFT <3 seconds b/l LE. Palpable pedal pulses b/l LE. Faintly palpable PT pulse(s) b/l LE. Pedal hair absent. No pain with calf compression b/l. Lower extremity skin temperature gradient within normal limits. No edema noted b/l LE. No ischemia or gangrene noted b/l LE. No cyanosis or clubbing noted b/l LE.  Dermatological Examination: No open wounds b/l LE. No interdigital macerations noted b/l LE. Toenails 1-5 b/l elongated, discolored, dystrophic,  thickened, crumbly with subungual debris and tenderness to dorsal palpation. Porokeratotic lesion(s) left foot. No erythema, no edema, no drainage, no fluctuance. Pedal skin noted to be dry b/l lower extremities.  Neurological Examination: Protective sensation intact 5/5 intact bilaterally with 10g monofilament b/l. Vibratory sensation intact b/l.  Musculoskeletal Examination: Muscle strength 5/5 to all lower extremity muscle groups bilaterally. Pes planus deformity noted bilateral LE. Patient ambulates independent of any assistive aids.  Footwear Assessment: Does the patient wear appropriate shoes? No. Does the patient need inserts/orthotics? Yes.  Lab Results  Component Value Date   HGBA1C 6.0 (H) 04/06/2021   Assessment: 1. Pain due to onychomycosis of toenails of both feet   2. Xerosis cutis   3. Porokeratosis   4. Pes planus of both feet   5. Diabetes mellitus due to underlying condition with chronic kidney disease on chronic dialysis, without long-term current use of insulin ([place])   6. Encounter for diabetic foot exam ([place])     ADA Risk Categorization: Low Risk:  Patient has all of the following: Intact protective sensation No prior foot ulcer  No severe deformity Pedal pulses present  Plan: -Patient was evaluated and treated. All patient's and/or POA's questions/concerns answered on today's visit. -Diabetic foot examination performed today. -Stressed the importance of good glycemic control and the detriment of not  controlling glucose levels in relation to the foot. -Patient to continue soft, supportive shoe gear daily. -Toenails 1-5 b/l were debrided in length and girth with sterile nail nippers and dremel without iatrogenic bleeding.  -Porokeratotic lesion(s) left foot pared and enucleated with sterile currette without incident. Total number of lesions debrided=1. -For xerosis, Rx sent for Ammonium Lactate Lotion 12%. Apply to feet twice daily avoiding application  between toes. -Patient/POA to call should there be question/concern in the interim.  Meds ordered this encounter  Medications   ammonium lactate (LAC-HYDRIN) 12 % lotion    Sig: Apply 1 Application topically as needed for dry skin.    Dispense:  400 g    Refill:  5    Return in about 3 months (around 12/03/2022).  Marzetta Board, DPM

## 2022-09-03 NOTE — Patient Instructions (Signed)

## 2022-09-08 ENCOUNTER — Ambulatory Visit (INDEPENDENT_AMBULATORY_CARE_PROVIDER_SITE_OTHER): Payer: Federal, State, Local not specified - PPO | Admitting: Physician Assistant

## 2022-09-08 ENCOUNTER — Ambulatory Visit (HOSPITAL_COMMUNITY)
Admission: RE | Admit: 2022-09-08 | Discharge: 2022-09-08 | Disposition: A | Discharge status: Home / Self Care | Payer: Federal, State, Local not specified - PPO | Source: Ambulatory Visit | Attending: Physician Assistant | Admitting: Physician Assistant

## 2022-09-08 ENCOUNTER — Other Ambulatory Visit: Payer: Self-pay

## 2022-09-08 VITALS — BP 167/86 | HR 67 | Temp 98.1°F | Resp 20 | Ht 74.0 in | Wt 298.6 lb

## 2022-09-08 DIAGNOSIS — Z992 Dependence on renal dialysis: Secondary | ICD-10-CM | POA: Diagnosis present

## 2022-09-08 DIAGNOSIS — N186 End stage renal disease: Secondary | ICD-10-CM

## 2022-09-08 MED ORDER — SODIUM CHLORIDE 0.9% FLUSH: 3.0000 mL | INTRAVENOUS | Status: AC | PRN

## 2022-09-08 MED ORDER — SODIUM CHLORIDE 0.9% FLUSH: 3.0000 mL | Freq: Two times a day (BID) | INTRAVENOUS | Status: AC

## 2022-09-08 MED ORDER — SODIUM CHLORIDE 0.9 % IV SOLN: 250.0000 mL | INTRAVENOUS | Status: AC | PRN

## 2022-09-08 NOTE — H&P (View-Only) (Signed)
POST OPERATIVE OFFICE NOTE    CC:  F/u for surgery  HPI:  This is a 62 y.o. male who is s/p left radio cephalic AV fistula on 71/69/67 by Dr. Trula Slade.  He is currently on HD via [facility].  Pt returns today for follow up.  Pt denise pain, loss of motor or loss of sensation.    Allergies  Allergen Reactions   Victoza [Liraglutide] Swelling    Current Outpatient Medications  Medication Sig Dispense Refill   amLODipine (NORVASC) 10 MG tablet Take 10 mg by mouth daily.     ammonium lactate (LAC-HYDRIN) 12 % lotion Apply 1 Application topically as needed for dry skin. 400 g 5   furosemide (LASIX) 40 MG tablet Take 40 mg by mouth 2 (two) times daily.     gabapentin (NEURONTIN) 300 MG capsule Take 1 capsule (300 mg total) by mouth at bedtime. (Patient taking differently: Take 300 mg by mouth at bedtime as needed (pain).) 30 capsule 3   metoprolol succinate (TOPROL-XL) 100 MG 24 hr tablet Take 100 mg by mouth 2 (two) times daily.     oxyCODONE-acetaminophen (PERCOCET) 5-325 MG tablet Take 1 tablet by mouth every 6 (six) hours as needed for severe pain. 10 tablet 0   No current facility-administered medications for this visit.     ROS:  See HPI  Physical Exam:  Findings:  +--------------------+----------+-----------------+--------+  AVF                PSV (cm/s)Flow Vol (mL/min)Comments  +--------------------+----------+-----------------+--------+  Native artery inflow   290           623                 +--------------------+----------+-----------------+--------+  AVF Anastomosis        467                     stenotic  +--------------------+----------+-----------------+--------+     +------------+----------+-------------+----------+-------------------+  OUTFLOW VEINPSV (cm/s)Diameter (cm)Depth (cm)     Describe        +------------+----------+-------------+----------+-------------------+  Dist UA        173        0.53        0.41                         +------------+----------+-------------+----------+-------------------+  AC Fossa       213        0.36        0.49    competing branch    +------------+----------+-------------+----------+-------------------+  Prox Forearm   212        0.33        0.41   partially-occlusive  +------------+----------+-------------+----------+-------------------+  Mid Forearm    169        0.58        0.38                        +------------+----------+-------------+----------+-------------------+  Dist Forearm   191        0.51        0.24                        +------------+----------+-------------+----------+-------------------+      Summary:  Patent left radiocephalic arteriovenous fistula with findings as noted  above.      Incision:  well healed Extremities:  palpable thrill distal 1/3 fistula, palpable radial pulse N/V/M intact  left UE Lungs non labored breathing     Assessment/Plan:  This is a 62 y.o. male who is s/p:left radio cephalic AV fistula on 92/11/94 by Dr. Trula Slade.  He is currently on HD via [facility].   The fistula is small in diameter < 0.4, the fistula appears patent and the depth is < 0.6, but there is an area of partial occlusive proximally.  The arterial flow rate is less than 600 ml/min.  I will schedule him for angiogram with possible intervention.     Roxy Horseman PA-C [facility] [phone]   Clinic MD:  Trula Slade

## 2022-09-08 NOTE — Progress Notes (Signed)
POST OPERATIVE OFFICE NOTE    CC:  F/u for surgery  HPI:  This is a 62 y.o. male who is s/p left radio cephalic AV fistula on 78/58/85 by Dr. Trula Slade.  He is currently on HD via [facility].  Pt returns today for follow up.  Pt denise pain, loss of motor or loss of sensation.    Allergies  Allergen Reactions   Victoza [Liraglutide] Swelling    Current Outpatient Medications  Medication Sig Dispense Refill   amLODipine (NORVASC) 10 MG tablet Take 10 mg by mouth daily.     ammonium lactate (LAC-HYDRIN) 12 % lotion Apply 1 Application topically as needed for dry skin. 400 g 5   furosemide (LASIX) 40 MG tablet Take 40 mg by mouth 2 (two) times daily.     gabapentin (NEURONTIN) 300 MG capsule Take 1 capsule (300 mg total) by mouth at bedtime. (Patient taking differently: Take 300 mg by mouth at bedtime as needed (pain).) 30 capsule 3   metoprolol succinate (TOPROL-XL) 100 MG 24 hr tablet Take 100 mg by mouth 2 (two) times daily.     oxyCODONE-acetaminophen (PERCOCET) 5-325 MG tablet Take 1 tablet by mouth every 6 (six) hours as needed for severe pain. 10 tablet 0   No current facility-administered medications for this visit.     ROS:  See HPI  Physical Exam:  Findings:  +--------------------+----------+-----------------+--------+  AVF                PSV (cm/s)Flow Vol (mL/min)Comments  +--------------------+----------+-----------------+--------+  Native artery inflow   290           623                 +--------------------+----------+-----------------+--------+  AVF Anastomosis        467                     stenotic  +--------------------+----------+-----------------+--------+     +------------+----------+-------------+----------+-------------------+  OUTFLOW VEINPSV (cm/s)Diameter (cm)Depth (cm)     Describe        +------------+----------+-------------+----------+-------------------+  Dist UA        173        0.53        0.41                         +------------+----------+-------------+----------+-------------------+  AC Fossa       213        0.36        0.49    competing branch    +------------+----------+-------------+----------+-------------------+  Prox Forearm   212        0.33        0.41   partially-occlusive  +------------+----------+-------------+----------+-------------------+  Mid Forearm    169        0.58        0.38                        +------------+----------+-------------+----------+-------------------+  Dist Forearm   191        0.51        0.24                        +------------+----------+-------------+----------+-------------------+      Summary:  Patent left radiocephalic arteriovenous fistula with findings as noted  above.      Incision:  well healed Extremities:  palpable thrill distal 1/3 fistula, palpable radial pulse N/V/M intact  left UE Lungs non labored breathing     Assessment/Plan:  This is a 62 y.o. male who is s/p:left radio cephalic AV fistula on 29/93/71 by Dr. Trula Slade.  He is currently on HD via [facility].   The fistula is small in diameter < 0.4, the fistula appears patent and the depth is < 0.6, but there is an area of partial occlusive proximally.  The arterial flow rate is less than 600 ml/min.  I will schedule him for angiogram with possible intervention.     Roxy Horseman PA-C [facility] [phone]   Clinic MD:  Trula Slade

## 2022-09-08 NOTE — H&P (View-Only) (Signed)
POST OPERATIVE OFFICE NOTE    CC:  F/u for surgery  HPI:  This is a 62 y.o. male who is s/p left radio cephalic AV fistula on 37/62/83 by Dr. Trula Slade.  He is currently on HD via [facility].  Pt returns today for follow up.  Pt denise pain, loss of motor or loss of sensation.    Allergies  Allergen Reactions   Victoza [Liraglutide] Swelling    Current Outpatient Medications  Medication Sig Dispense Refill   amLODipine (NORVASC) 10 MG tablet Take 10 mg by mouth daily.     ammonium lactate (LAC-HYDRIN) 12 % lotion Apply 1 Application topically as needed for dry skin. 400 g 5   furosemide (LASIX) 40 MG tablet Take 40 mg by mouth 2 (two) times daily.     gabapentin (NEURONTIN) 300 MG capsule Take 1 capsule (300 mg total) by mouth at bedtime. (Patient taking differently: Take 300 mg by mouth at bedtime as needed (pain).) 30 capsule 3   metoprolol succinate (TOPROL-XL) 100 MG 24 hr tablet Take 100 mg by mouth 2 (two) times daily.     oxyCODONE-acetaminophen (PERCOCET) 5-325 MG tablet Take 1 tablet by mouth every 6 (six) hours as needed for severe pain. 10 tablet 0   No current facility-administered medications for this visit.     ROS:  See HPI  Physical Exam:  Findings:  +--------------------+----------+-----------------+--------+  AVF                PSV (cm/s)Flow Vol (mL/min)Comments  +--------------------+----------+-----------------+--------+  Native artery inflow   290           623                 +--------------------+----------+-----------------+--------+  AVF Anastomosis        467                     stenotic  +--------------------+----------+-----------------+--------+     +------------+----------+-------------+----------+-------------------+  OUTFLOW VEINPSV (cm/s)Diameter (cm)Depth (cm)     Describe        +------------+----------+-------------+----------+-------------------+  Dist UA        173        0.53        0.41                         +------------+----------+-------------+----------+-------------------+  AC Fossa       213        0.36        0.49    competing branch    +------------+----------+-------------+----------+-------------------+  Prox Forearm   212        0.33        0.41   partially-occlusive  +------------+----------+-------------+----------+-------------------+  Mid Forearm    169        0.58        0.38                        +------------+----------+-------------+----------+-------------------+  Dist Forearm   191        0.51        0.24                        +------------+----------+-------------+----------+-------------------+      Summary:  Patent left radiocephalic arteriovenous fistula with findings as noted  above.      Incision:  well healed Extremities:  palpable thrill distal 1/3 fistula, palpable radial pulse N/V/M intact  left UE Lungs non labored breathing     Assessment/Plan:  This is a 62 y.o. male who is s/p:left radio cephalic AV fistula on 44/81/85 by Dr. Trula Slade.  He is currently on HD via [facility].   The fistula is small in diameter < 0.4, the fistula appears patent and the depth is < 0.6, but there is an area of partial occlusive proximally.  The arterial flow rate is less than 600 ml/min.  I will schedule him for angiogram with possible intervention.     Roxy Horseman PA-C [facility] [phone]   Clinic MD:  Trula Slade

## 2022-09-16 ENCOUNTER — Ambulatory Visit (HOSPITAL_COMMUNITY)
Admission: RE | Admit: 2022-09-16 | Discharge: 2022-09-16 | Disposition: A | Discharge status: Home / Self Care | Payer: Federal, State, Local not specified - PPO | Source: Ambulatory Visit | Attending: Surgery | Admitting: Surgery

## 2022-09-16 ENCOUNTER — Other Ambulatory Visit: Payer: Self-pay

## 2022-09-16 ENCOUNTER — Encounter (HOSPITAL_COMMUNITY)
Admission: RE | Disposition: A | Discharge status: Home / Self Care | Payer: Self-pay | Source: Ambulatory Visit | Attending: Surgery

## 2022-09-16 ENCOUNTER — Encounter (HOSPITAL_COMMUNITY): Payer: Self-pay | Admitting: Surgery

## 2022-09-16 DIAGNOSIS — Y832 Surgical operation with anastomosis, bypass or graft as the cause of abnormal reaction of the patient, or of later complication, without mention of misadventure at the time of the procedure: Secondary | ICD-10-CM | POA: Insufficient documentation

## 2022-09-16 DIAGNOSIS — N186 End stage renal disease: Secondary | ICD-10-CM

## 2022-09-16 DIAGNOSIS — T82898A Other specified complication of vascular prosthetic devices, implants and grafts, initial encounter: Secondary | ICD-10-CM | POA: Insufficient documentation

## 2022-09-16 DIAGNOSIS — Z992 Dependence on renal dialysis: Secondary | ICD-10-CM | POA: Insufficient documentation

## 2022-09-16 DIAGNOSIS — N185 Chronic kidney disease, stage 5: Secondary | ICD-10-CM | POA: Diagnosis not present

## 2022-09-16 HISTORY — PX: A/V FISTULAGRAM: CATH118298

## 2022-09-16 LAB — POCT I-STAT, CHEM 8
BUN: 43 mg/dL — ABNORMAL HIGH (ref 6–20)
Calcium, Ion: 0.92 mmol/L — ABNORMAL LOW (ref 1.15–1.40)
Chloride: 98 mmol/L (ref 98–111)
Creatinine, Ser: 6 mg/dL — ABNORMAL HIGH (ref 0.61–1.24)
Glucose, Bld: 203 mg/dL — ABNORMAL HIGH (ref 70–99)
HCT: 36 % — ABNORMAL LOW (ref 39.0–52.0)
Hemoglobin: 12.2 g/dL — ABNORMAL LOW (ref 13.0–17.0)
Potassium: 4.7 mmol/L (ref 3.5–5.1)
Sodium: 137 mmol/L (ref 135–145)
TCO2: 30 mmol/L (ref 22–32)

## 2022-09-16 SURGERY — A/V FISTULAGRAM
Anesthesia: LOCAL | Laterality: Left

## 2022-09-16 MED ORDER — LIDOCAINE HCL (PF) 1 % IJ SOLN
INTRAMUSCULAR | Status: DC | PRN
  Administered 2022-09-16: 5 mL

## 2022-09-16 MED ORDER — HEPARIN (PORCINE) IN NACL 1000-0.9 UT/500ML-% IV SOLN
INTRAVENOUS | Status: AC
  Filled 2022-09-16: qty 500

## 2022-09-16 MED ORDER — LIDOCAINE HCL (PF) 1 % IJ SOLN
INTRAMUSCULAR | Status: AC
  Filled 2022-09-16: qty 30

## 2022-09-16 MED ORDER — HEPARIN (PORCINE) IN NACL 1000-0.9 UT/500ML-% IV SOLN
INTRAVENOUS | Status: DC | PRN
  Administered 2022-09-16: 500 mL

## 2022-09-16 MED ORDER — IODIXANOL 320 MG/ML IV SOLN
INTRAVENOUS | Status: DC | PRN
  Administered 2022-09-16: 30 mg

## 2022-09-16 SURGICAL SUPPLY — 9 items
BAG SNAP BAND KOVER 36X36 (MISCELLANEOUS) ×1 IMPLANT
COVER DOME SNAP 22 D (MISCELLANEOUS) ×1 IMPLANT
KIT MICROPUNCTURE NIT STIFF (SHEATH) IMPLANT
PROTECTION STATION PRESSURIZED (MISCELLANEOUS) ×1
SHEATH PROBE COVER 6X72 (BAG) ×1 IMPLANT
STATION PROTECTION PRESSURIZED (MISCELLANEOUS) ×1 IMPLANT
STOPCOCK MORSE 400PSI 3WAY (MISCELLANEOUS) ×1 IMPLANT
TRAY PV CATH (CUSTOM PROCEDURE TRAY) ×1 IMPLANT
TUBING CIL FLEX 10 FLL-RA (TUBING) ×1 IMPLANT

## 2022-09-16 NOTE — Interval H&P Note (Signed)
History and Physical Interval Note:  09/16/2022 10:47 AM  Joshua Case  has presented today for surgery, with the diagnosis of instage renal.  The various methods of treatment have been discussed with the patient and family. After consideration of risks, benefits and other options for treatment, the patient has consented to  Procedure(s): A/V Fistulagram (Left) as a surgical intervention.  The patient's history has been reviewed, patient examined, no change in status, stable for surgery.  I have reviewed the patient's chart and labs.  Questions were answered to the patient's satisfaction.     [name]

## 2022-09-16 NOTE — Op Note (Signed)
    Patient name: Joshua Case MRN: [id number] DOB: 01/19/1961 Sex: male  09/16/2022 Pre-operative Diagnosis: ESRD Post-operative diagnosis:  Same Surgeon:  [name] Procedure Performed:  1.  U/s guided access left arm cephalic vein  2.  fistulogram    Indications: This is a 62 year old gentleman who underwent left radiocephalic fistula placement that is not maturing he is here for fistulogram.  Procedure:  The patient was identified in the holding area and taken to room 8.  The patient was then placed supine on the table and prepped and draped in the usual sterile fashion.  A time out was called.  Ultrasound was used to evaluate the fistula.  The vein was patent and compressible.  A digital ultrasound image was acquired.  The fistula was then accessed under ultrasound guidance using a micropuncture needle.  An 018 wire was then asvanced without resistance and a micropuncture sheath was placed.  Contrast injections were then performed through the sheath.  Findings: No evidence of central venous stenosis.  The arterial venous anastomosis is widely patent.  There are multiple branches in the forearm.  The elbow, the outflow tract branches into multiple veins.   Intervention:  none  Impression:  #1  No evidence of stenosis within the fistula  #2  Multiple branches in the forearm.  Branch ligation will be scheduled   V. [name], M.D., [facility] [place] Office: [phone] Pager:  [phone]

## 2022-09-17 ENCOUNTER — Other Ambulatory Visit: Payer: Self-pay | Admitting: *Deleted

## 2022-09-17 DIAGNOSIS — N186 End stage renal disease: Secondary | ICD-10-CM

## 2022-09-30 ENCOUNTER — Encounter (HOSPITAL_COMMUNITY): Payer: Self-pay | Admitting: Surgery

## 2022-09-30 ENCOUNTER — Other Ambulatory Visit: Payer: Self-pay

## 2022-09-30 NOTE — Progress Notes (Signed)
09/29/22 -I was unable to reach Mr. Berdine Dance on his phone; I called Mrs. Bahri and left a message on her phone to have Mr. Berdine Addison hold [place] today or tomorrow if he takes it on either this day. I also said that I do need to speak with Mr. Mccannon tomorrow, I have questions and instructions.

## 2022-09-30 NOTE — Progress Notes (Signed)
[name] denies chest pain or shortness of breath.  Patient denies having any s/s of Covid in his household, also denies any known exposure to Covid.   [name] PCP is Dr. Charlott Rakes.  Mr Joshua Case has type II diabetes, patient reports that he checks CBG every other day. CBGs run 100-104. Patient is out of [facility], has been out for 3 weeks. I instructed [name] to check CBG after awaking and every 2 hours until arrival  to the hospital.  I Instructed patient if CBG is less than 70 to take 4 Glucose Tablets or 1 tube of Glucose Gel or 1/2 cup of a clear juice. Recheck CBG in 15 minutes if CBG is not over 70 call, pre- op desk at [phone] for further instructions. I instructed patient to not take Glipizide in am.

## 2022-10-01 ENCOUNTER — Ambulatory Visit (HOSPITAL_COMMUNITY): Payer: Federal, State, Local not specified - PPO | Admitting: Certified Registered"

## 2022-10-01 ENCOUNTER — Ambulatory Visit (HOSPITAL_COMMUNITY)
Admission: RE | Admit: 2022-10-01 | Discharge: 2022-10-01 | Disposition: A | Discharge status: Home / Self Care | Payer: Federal, State, Local not specified - PPO | Attending: Surgery | Admitting: Surgery

## 2022-10-01 ENCOUNTER — Other Ambulatory Visit: Payer: Self-pay

## 2022-10-01 ENCOUNTER — Encounter (HOSPITAL_COMMUNITY): Payer: Self-pay | Admitting: Surgery

## 2022-10-01 ENCOUNTER — Encounter (HOSPITAL_COMMUNITY)
Admission: RE | Disposition: A | Discharge status: Home / Self Care | Payer: Self-pay | Source: Home / Self Care | Attending: Surgery

## 2022-10-01 DIAGNOSIS — X58XXXA Exposure to other specified factors, initial encounter: Secondary | ICD-10-CM | POA: Diagnosis not present

## 2022-10-01 DIAGNOSIS — T82898A Other specified complication of vascular prosthetic devices, implants and grafts, initial encounter: Secondary | ICD-10-CM | POA: Insufficient documentation

## 2022-10-01 DIAGNOSIS — N186 End stage renal disease: Secondary | ICD-10-CM | POA: Insufficient documentation

## 2022-10-01 DIAGNOSIS — I120 Hypertensive chronic kidney disease with stage 5 chronic kidney disease or end stage renal disease: Secondary | ICD-10-CM | POA: Insufficient documentation

## 2022-10-01 DIAGNOSIS — Z992 Dependence on renal dialysis: Secondary | ICD-10-CM | POA: Insufficient documentation

## 2022-10-01 DIAGNOSIS — N185 Chronic kidney disease, stage 5: Secondary | ICD-10-CM

## 2022-10-01 DIAGNOSIS — Z8673 Personal history of transient ischemic attack (TIA), and cerebral infarction without residual deficits: Secondary | ICD-10-CM | POA: Diagnosis not present

## 2022-10-01 DIAGNOSIS — M1990 Unspecified osteoarthritis, unspecified site: Secondary | ICD-10-CM | POA: Insufficient documentation

## 2022-10-01 DIAGNOSIS — E1122 Type 2 diabetes mellitus with diabetic chronic kidney disease: Secondary | ICD-10-CM | POA: Insufficient documentation

## 2022-10-01 HISTORY — DX: End stage renal disease: N18.6

## 2022-10-01 HISTORY — DX: Unspecified osteoarthritis, unspecified site: M19.90

## 2022-10-01 HISTORY — PX: LIGATION OF ARTERIOVENOUS  FISTULA: SHX5948

## 2022-10-01 LAB — GLUCOSE, CAPILLARY
Glucose-Capillary: 190 mg/dL — ABNORMAL HIGH (ref 70–99)
Glucose-Capillary: 167 mg/dL — ABNORMAL HIGH (ref 70–99)

## 2022-10-01 LAB — POCT I-STAT, CHEM 8
BUN: 38 mg/dL — ABNORMAL HIGH (ref 6–20)
Calcium, Ion: 1.03 mmol/L — ABNORMAL LOW (ref 1.15–1.40)
Chloride: 98 mmol/L (ref 98–111)
Creatinine, Ser: 7.3 mg/dL — ABNORMAL HIGH (ref 0.61–1.24)
Glucose, Bld: 211 mg/dL — ABNORMAL HIGH (ref 70–99)
HCT: 38 % — ABNORMAL LOW (ref 39.0–52.0)
Hemoglobin: 12.9 g/dL — ABNORMAL LOW (ref 13.0–17.0)
Potassium: 4.3 mmol/L (ref 3.5–5.1)
Sodium: 137 mmol/L (ref 135–145)
TCO2: 26 mmol/L (ref 22–32)

## 2022-10-01 SURGERY — LIGATION OF ARTERIOVENOUS  FISTULA
Anesthesia: General | Laterality: Left

## 2022-10-01 MED ORDER — INSULIN ASPART 100 UNIT/ML IJ SOLN
0.0000 [IU] | INTRAMUSCULAR | Status: DC | PRN
  Administered 2022-10-01: 2 [IU] via SUBCUTANEOUS
  Filled 2022-10-01: qty 1

## 2022-10-01 MED ORDER — CEFAZOLIN IN SODIUM CHLORIDE 3-0.9 GM/100ML-% IV SOLN
3.0000 g | INTRAVENOUS | Status: AC
  Administered 2022-10-01: 3 g via INTRAVENOUS
  Filled 2022-10-01: qty 100

## 2022-10-01 MED ORDER — ORAL CARE MOUTH RINSE: 15.0000 mL | Freq: Once | OROMUCOSAL | Status: DC

## 2022-10-01 MED ORDER — FENTANYL CITRATE (PF) 250 MCG/5ML IJ SOLN
INTRAMUSCULAR | Status: AC
  Filled 2022-10-01: qty 5

## 2022-10-01 MED ORDER — CHLORHEXIDINE GLUCONATE 0.12 % MT SOLN: 15.0000 mL | Freq: Once | OROMUCOSAL | Status: DC

## 2022-10-01 MED ORDER — SUCCINYLCHOLINE CHLORIDE 200 MG/10ML IV SOSY
PREFILLED_SYRINGE | INTRAVENOUS | Status: AC
  Filled 2022-10-01: qty 10

## 2022-10-01 MED ORDER — ONDANSETRON HCL 4 MG/2ML IJ SOLN
INTRAMUSCULAR | Status: AC
  Filled 2022-10-01: qty 2

## 2022-10-01 MED ORDER — ONDANSETRON HCL 4 MG/2ML IJ SOLN
INTRAMUSCULAR | Status: DC | PRN
  Administered 2022-10-01: 4 mg via INTRAVENOUS

## 2022-10-01 MED ORDER — MIDAZOLAM HCL 2 MG/2ML IJ SOLN
INTRAMUSCULAR | Status: DC | PRN
  Administered 2022-10-01 (×2): 1 mg via INTRAVENOUS

## 2022-10-01 MED ORDER — EPHEDRINE SULFATE-NACL 50-0.9 MG/10ML-% IV SOSY
PREFILLED_SYRINGE | INTRAVENOUS | Status: DC | PRN
  Administered 2022-10-01: 5 mg via INTRAVENOUS

## 2022-10-01 MED ORDER — CHLORHEXIDINE GLUCONATE 4 % EX LIQD: 60.0000 mL | Freq: Once | CUTANEOUS | Status: DC

## 2022-10-01 MED ORDER — MIDAZOLAM HCL 2 MG/2ML IJ SOLN
INTRAMUSCULAR | Status: AC
  Filled 2022-10-01: qty 2

## 2022-10-01 MED ORDER — PROPOFOL 10 MG/ML IV BOLUS
INTRAVENOUS | Status: AC
  Filled 2022-10-01: qty 20

## 2022-10-01 MED ORDER — SODIUM CHLORIDE 0.9 % IV SOLN: INTRAVENOUS | Status: DC

## 2022-10-01 MED ORDER — LIDOCAINE-EPINEPHRINE (PF) 1 %-1:200000 IJ SOLN
INTRAMUSCULAR | Status: AC
  Filled 2022-10-01: qty 30

## 2022-10-01 MED ORDER — 0.9 % SODIUM CHLORIDE (POUR BTL) OPTIME
TOPICAL | Status: DC | PRN
  Administered 2022-10-01: 1000 mL

## 2022-10-01 MED ORDER — PROMETHAZINE HCL 25 MG/ML IJ SOLN: 6.2500 mg | INTRAMUSCULAR | Status: DC | PRN

## 2022-10-01 MED ORDER — FENTANYL CITRATE (PF) 250 MCG/5ML IJ SOLN
INTRAMUSCULAR | Status: DC | PRN
  Administered 2022-10-01: 50 ug via INTRAVENOUS
  Administered 2022-10-01: 150 ug via INTRAVENOUS

## 2022-10-01 MED ORDER — HEPARIN 6000 UNIT IRRIGATION SOLUTION
Status: AC
  Filled 2022-10-01: qty 500

## 2022-10-01 MED ORDER — ACETAMINOPHEN 10 MG/ML IV SOLN: 1000.0000 mg | Freq: Once | INTRAVENOUS | Status: DC | PRN

## 2022-10-01 MED ORDER — DEXAMETHASONE SODIUM PHOSPHATE 10 MG/ML IJ SOLN
INTRAMUSCULAR | Status: AC
  Filled 2022-10-01: qty 1

## 2022-10-01 MED ORDER — LIDOCAINE 2% (20 MG/ML) 5 ML SYRINGE
INTRAMUSCULAR | Status: DC | PRN
  Administered 2022-10-01: 60 mg via INTRAVENOUS

## 2022-10-01 MED ORDER — ROCURONIUM BROMIDE 10 MG/ML (PF) SYRINGE
PREFILLED_SYRINGE | INTRAVENOUS | Status: AC
  Filled 2022-10-01: qty 10

## 2022-10-01 MED ORDER — LIDOCAINE-EPINEPHRINE (PF) 1 %-1:200000 IJ SOLN
INTRAMUSCULAR | Status: DC | PRN
  Administered 2022-10-01: 18 mL

## 2022-10-01 MED ORDER — OXYCODONE HCL 5 MG/5ML PO SOLN: 5.0000 mg | Freq: Once | ORAL | Status: DC | PRN

## 2022-10-01 MED ORDER — OXYCODONE-ACETAMINOPHEN 5-325 MG PO TABS: 1.0000 | ORAL_TABLET | Freq: Four times a day (QID) | ORAL | 0 refills | Status: AC | PRN

## 2022-10-01 MED ORDER — CISATRACURIUM BESYLATE 20 MG/10ML IV SOLN
INTRAVENOUS | Status: AC
  Filled 2022-10-01: qty 10

## 2022-10-01 MED ORDER — OXYCODONE HCL 5 MG PO TABS: 5.0000 mg | ORAL_TABLET | Freq: Once | ORAL | Status: DC | PRN

## 2022-10-01 MED ORDER — LIDOCAINE 2% (20 MG/ML) 5 ML SYRINGE
INTRAMUSCULAR | Status: AC
  Filled 2022-10-01: qty 5

## 2022-10-01 MED ORDER — PHENYLEPHRINE HCL-NACL 20-0.9 MG/250ML-% IV SOLN
INTRAVENOUS | Status: DC | PRN
  Administered 2022-10-01: 25 ug/min via INTRAVENOUS

## 2022-10-01 MED ORDER — HEPARIN 6000 UNIT IRRIGATION SOLUTION
Status: DC | PRN
  Administered 2022-10-01: 1

## 2022-10-01 MED ORDER — SUCCINYLCHOLINE CHLORIDE 200 MG/10ML IV SOSY
PREFILLED_SYRINGE | INTRAVENOUS | Status: DC | PRN
  Administered 2022-10-01: 120 mg via INTRAVENOUS

## 2022-10-01 MED ORDER — PROPOFOL 10 MG/ML IV BOLUS
INTRAVENOUS | Status: DC | PRN
  Administered 2022-10-01: 100 mg via INTRAVENOUS
  Administered 2022-10-01: 30 mg via INTRAVENOUS

## 2022-10-01 MED ORDER — EPHEDRINE 5 MG/ML INJ
INTRAVENOUS | Status: AC
  Filled 2022-10-01: qty 5

## 2022-10-01 MED ORDER — FENTANYL CITRATE (PF) 100 MCG/2ML IJ SOLN: 25.0000 ug | INTRAMUSCULAR | Status: DC | PRN

## 2022-10-01 MED ORDER — DEXAMETHASONE SODIUM PHOSPHATE 10 MG/ML IJ SOLN
INTRAMUSCULAR | Status: DC | PRN
  Administered 2022-10-01: 5 mg via INTRAVENOUS

## 2022-10-01 SURGICAL SUPPLY — 31 items
ADH SKN CLS APL DERMABOND .7 (GAUZE/BANDAGES/DRESSINGS) ×5
BAG COUNTER SPONGE SURGICOUNT (BAG) ×1 IMPLANT
BAG SPNG CNTER NS LX DISP (BAG) ×1
CANISTER SUCT 3000ML PPV (MISCELLANEOUS) ×1 IMPLANT
CLIP VESOCCLUDE MED 6/CT (CLIP) ×1 IMPLANT
CLIP VESOCCLUDE SM WIDE 6/CT (CLIP) ×1 IMPLANT
DERMABOND ADVANCED .7 DNX12 (GAUZE/BANDAGES/DRESSINGS) ×1 IMPLANT
ELECT REM PT RETURN 9FT ADLT (ELECTROSURGICAL) ×1
ELECTRODE REM PT RTRN 9FT ADLT (ELECTROSURGICAL) ×1 IMPLANT
GLOVE SURG SS PI 7.5 STRL IVOR (GLOVE) ×3 IMPLANT
GOWN STRL REUS W/ TWL LRG LVL3 (GOWN DISPOSABLE) ×2 IMPLANT
GOWN STRL REUS W/ TWL XL LVL3 (GOWN DISPOSABLE) ×1 IMPLANT
GOWN STRL REUS W/TWL LRG LVL3 (GOWN DISPOSABLE) ×2
GOWN STRL REUS W/TWL XL LVL3 (GOWN DISPOSABLE) ×1
HEMOSTAT SNOW SURGICEL 2X4 (HEMOSTASIS) IMPLANT
KIT BASIN OR (CUSTOM PROCEDURE TRAY) ×1 IMPLANT
KIT TURNOVER KIT B (KITS) ×1 IMPLANT
NS IRRIG 1000ML POUR BTL (IV SOLUTION) ×1 IMPLANT
PACK CV ACCESS (CUSTOM PROCEDURE TRAY) ×1 IMPLANT
PAD ARMBOARD 7.5X6 YLW CONV (MISCELLANEOUS) ×2 IMPLANT
SUT ETHILON 3 0 PS 1 (SUTURE) IMPLANT
SUT PROLENE 6 0 BV (SUTURE) IMPLANT
SUT SILK 0 TIES 10X30 (SUTURE) ×1 IMPLANT
SUT SILK 3 0 (SUTURE) ×1
SUT SILK 3-0 18XBRD TIE 12 (SUTURE) IMPLANT
SUT VIC AB 3-0 SH 27 (SUTURE) ×2
SUT VIC AB 3-0 SH 27X BRD (SUTURE) ×1 IMPLANT
SUT VICRYL 4-0 PS2 18IN ABS (SUTURE) IMPLANT
TOWEL GREEN STERILE (TOWEL DISPOSABLE) ×1 IMPLANT
UNDERPAD 30X36 HEAVY ABSORB (UNDERPADS AND DIAPERS) ×1 IMPLANT
WATER STERILE IRR 1000ML POUR (IV SOLUTION) ×1 IMPLANT

## 2022-10-01 NOTE — Interval H&P Note (Signed)
History and Physical Interval Note:  10/01/2022 11:55 AM  Joshua Case  has presented today for surgery, with the diagnosis of ESRD.  The various methods of treatment have been discussed with the patient and family. After consideration of risks, benefits and other options for treatment, the patient has consented to  Procedure(s): LIGATION OF LEFT RADIOCEPHALIC ARTERIOVENOUS  FISTULA (Left) as a surgical intervention.  The patient's history has been reviewed, patient examined, no change in status, stable for surgery.  I have reviewed the patient's chart and labs.  Questions were answered to the patient's satisfaction.     [name]  After fistulogram we discussed branch ligation and elevation   Wells Delanda Bulluck

## 2022-10-01 NOTE — Transfer of Care (Signed)
Immediate Anesthesia Transfer of Care Note  Patient: Joshua Case  Procedure(s) Performed: LIGATION and ELEVATION  LEFT  ARTERIOVENOUS  FISTULA (Left)  Patient Location: PACU  Anesthesia Type:General  Level of Consciousness: awake and drowsy  Airway & Oxygen Therapy: Patient Spontanous Breathing and Patient connected to face mask oxygen  Post-op Assessment: Report given to RN, Post -op Vital signs reviewed and stable, and Patient moving all extremities X 4  Post vital signs: Reviewed and stable  Last Vitals:  Vitals Value Taken Time  BP 132/72 10/01/22 1401  Temp    Pulse 73 10/01/22 1403  Resp 19 10/01/22 1403  SpO2 100 % 10/01/22 1403  Vitals shown include unvalidated device data.  Last Pain:  Vitals:   10/01/22 1108  TempSrc:   PainSc: 0-No pain      Patients Stated Pain Goal: 0 (77/93/90 3009)  Complications: No notable events documented.

## 2022-10-01 NOTE — Discharge Instructions (Signed)
   [facility] [place] ° °Discharge Instructions ° °AV Fistula or Graft Surgery for Dialysis Access ° °Please refer to the following instructions for your post-procedure care. Your surgeon or physician assistant will discuss any changes with you. ° °Activity ° °You may drive the day following your surgery, if you are comfortable and no longer taking prescription pain medication. Resume full activity as the soreness in your incision resolves. ° °Bathing/Showering ° °You may shower after you go home. Keep your incision dry for 48 hours. Do not soak in a bathtub, hot tub, or swim until the incision heals completely. You may not shower if you have a hemodialysis catheter. ° °Incision Care ° °Clean your incision with mild soap and water after 48 hours. Pat the area dry with a clean towel. You do not need a bandage unless otherwise instructed. Do not apply any ointments or creams to your incision. You may have skin glue on your incision. Do not peel it off. It will come off on its own in about one week. Your arm may swell a bit after surgery. To reduce swelling use pillows to elevate your arm so it is above your heart. Your doctor will tell you if you need to lightly wrap your arm with an ACE bandage. ° °Diet ° °Resume your normal diet. There are not special food restrictions following this procedure. In order to heal from your surgery, it is CRITICAL to get adequate nutrition. Your body requires vitamins, minerals, and protein. Vegetables are the best source of vitamins and minerals. Vegetables also provide the perfect balance of protein. Processed food has [place] nutritional value, so try to avoid this. ° °Medications ° °Resume taking all of your medications. If your incision is causing pain, you may take over-the counter pain relievers such as acetaminophen (Tylenol). If you were prescribed a stronger pain medication, please be aware these medications can cause nausea and constipation. Prevent  nausea by taking the medication with a snack or meal. Avoid constipation by drinking plenty of fluids and eating foods with high amount of fiber, such as fruits, vegetables, and grains. Do not take Tylenol if you are taking prescription pain medications. ° ° ° ° °Follow up °Your surgeon may want to see you in the office following your access surgery. If so, this will be arranged at the time of your surgery. ° °Please call us immediately for any of the following conditions: ° °Increased pain, redness, drainage (pus) from your incision site °Fever of 101 degrees or higher °Severe or worsening pain at your incision site °Hand pain or numbness. ° °Reduce your risk of vascular disease: ° °Stop smoking. If you would like help, call [facility] at 1-800-QUIT-NOW (1-800-784-8669) or [place] at 336-586-4000 ° °Manage your cholesterol °Maintain a desired weight °Control your diabetes °Keep your blood pressure down ° °Dialysis ° °It will take several weeks to several months for your new dialysis access to be ready for use. Your surgeon will determine when it is OK to use it. Your nephrologist will continue to direct your dialysis. You can continue to use your Permcath until your new access is ready for use. ° °If you have any questions, please call the office at [phone]. ° °

## 2022-10-01 NOTE — Anesthesia Procedure Notes (Signed)
Procedure Name: Intubation Date/Time: 10/01/2022 12:35 PM  Performed by: Harden Mo, CRNAPre-anesthesia Checklist: Patient identified, Emergency Drugs available, Suction available and Patient being monitored Patient Re-evaluated:Patient Re-evaluated prior to induction Oxygen Delivery Method: Circle System Utilized Preoxygenation: Pre-oxygenation with 100% oxygen Induction Type: IV induction and Rapid sequence Laryngoscope Size: Glidescope and 4 Grade View: Grade I Tube type: Oral Tube size: 7.5 mm Number of attempts: 1 Airway Equipment and Method: Stylet and Oral airway Placement Confirmation: ETT inserted through vocal cords under direct vision, positive ETCO2 and breath sounds checked- equal and bilateral Secured at: 24 cm Tube secured with: Tape Dental Injury: Teeth and Oropharynx as per pre-operative assessment

## 2022-10-01 NOTE — Op Note (Signed)
    Patient name: Joshua Case MRN: [id number] DOB: 09-21-1960 Sex: male  10/01/2022 Pre-operative Diagnosis: Non maturing left radiocephalic fistula Post-operative diagnosis:  Same Surgeon:  [name] Assistants:  Arlee Muslim, PA Procedure:   Revision of left radiocephalic fistula by elevation and branch ligation Anesthesia:  General Blood Loss:  50 cc Specimens:  none  Findings: Multiple branches were ligated.  The fistula was elevated.  It measured approximately 5 to 6 mm.  Indications: This is a 62 year old gentleman with a radiocephalic fistula that is difficult to use.  He underwent a fistulogram that showed a widely patent fistula with multiple branches.  He comes in today for branch ligation and elevation.  Procedure:  The patient was identified in the holding area and taken to [place] 16  The patient was then placed supine on the table. general anesthesia was administered.  The patient was prepped and draped in the usual sterile fashion.  A time out was called and antibiotics were administered.  A PA was necessary to expedite the procedure and assist with technical details.  He helped with exposure by providing suction and retraction.  He helped with wound closure.  Ultrasound was used to map the course of the cephalic vein in the forearm.  Branches were marked with a pen on the skin.  I then made 2 longitudinal incisions over top of the fistula.  Cautery was used divide subcutaneous tissue until the fistula was visualized.  The fistula was circumferentially exposed.  It was fully mobilized from the wrist up to the antecubital crease.  There were multiple branches that were ligated between silk ties.  The vein measured approximately 5-6 mm.  There was an excellent thrill.  Once the vein was fully exposed and the branches were all ligated, I reapproximated the subcutaneous tissue posterior to the fistula with interrupted 3-0 Vicryl suture.  The skin was closed with 4-0 Vicryl.   Dermabond was applied.  There were no immediate complications.   Disposition: To PACU stable   V. [name], M.D., [facility], [place] [facility] [place] Office: [phone] Pager:  [phone]

## 2022-10-01 NOTE — Anesthesia Postprocedure Evaluation (Signed)
Anesthesia Post Note  Patient: Calyb L Starkes  Procedure(s) Performed: LIGATION and ELEVATION  LEFT  ARTERIOVENOUS  FISTULA (Left)     Patient location during evaluation: PACU Anesthesia Type: General Level of consciousness: awake Pain management: pain level controlled Vital Signs Assessment: post-procedure vital signs reviewed and stable Respiratory status: spontaneous breathing, nonlabored ventilation and respiratory function stable Cardiovascular status: blood pressure returned to baseline and stable Postop Assessment: no apparent nausea or vomiting Anesthetic complications: no   No notable events documented.  Last Vitals:  Vitals:   10/01/22 1415 10/01/22 1430  BP: 125/77 (!) 129/59  Pulse: 71 64  Resp: 17 10  Temp:  36.6 C  SpO2: 100% 100%    Last Pain:  Vitals:   10/01/22 1415  TempSrc:   PainSc: Asleep                 Lewanna Petrak P Manraj Yeo

## 2022-10-01 NOTE — Anesthesia Preprocedure Evaluation (Addendum)
Anesthesia Evaluation  Patient identified by MRN, date of birth, ID band Patient awake    Reviewed: Allergy & Precautions, NPO status , Patient's Chart, lab work & pertinent test results  Airway Mallampati: III  TM Distance: >3 FB Neck ROM: Full    Dental  (+) Chipped,    Pulmonary neg pulmonary ROS, Patient abstained from smoking.   Pulmonary exam normal        Cardiovascular hypertension, Pt. on medications and Pt. on home beta blockers Normal cardiovascular exam     Neuro/Psych CVA, No Residual Symptoms  negative psych ROS   GI/Hepatic negative GI ROS, Neg liver ROS,,,  Endo/Other  diabetes, Oral Hypoglycemic Agents    Renal/GU ESRF and DialysisRenal disease     Musculoskeletal  (+) Arthritis ,    Abdominal   Peds  Hematology negative hematology ROS (+)   Anesthesia Other Findings ESRD  Reproductive/Obstetrics                             Anesthesia Physical Anesthesia Plan  ASA: 3  Anesthesia Plan: General   Post-op Pain Management:    Induction: Intravenous  PONV Risk Score and Plan: 2 and Ondansetron, Dexamethasone and Treatment may vary due to age or medical condition  Airway Management Planned: Oral ETT  Additional Equipment:   Intra-op Plan:   Post-operative Plan: Extubation in OR  Informed Consent: I have reviewed the patients History and Physical, chart, labs and discussed the procedure including the risks, benefits and alternatives for the proposed anesthesia with the patient or authorized representative who has indicated his/her understanding and acceptance.     Dental advisory given  Plan Discussed with: CRNA  Anesthesia Plan Comments:        Anesthesia Quick Evaluation

## 2022-10-02 ENCOUNTER — Encounter (HOSPITAL_COMMUNITY): Payer: Self-pay | Admitting: Surgery

## 2022-10-03 ENCOUNTER — Telehealth: Payer: Self-pay | Admitting: Physician Assistant

## 2022-10-03 NOTE — Telephone Encounter (Signed)
-----   Message from Dagoberto Ligas, PA-C sent at 10/01/2022  1:50 PM EST -----  Incision check in 3 weeks with a PA.  PO L arm AVF side branch ligation and superficialization Thanks

## 2022-10-11 ENCOUNTER — Encounter: Payer: Self-pay | Admitting: Physician Assistant

## 2022-11-17 ENCOUNTER — Ambulatory Visit (INDEPENDENT_AMBULATORY_CARE_PROVIDER_SITE_OTHER): Payer: Federal, State, Local not specified - PPO | Admitting: Physician Assistant

## 2022-11-17 VITALS — BP 116/67 | HR 93 | Temp 97.9°F | Resp 18 | Ht 74.0 in | Wt 293.0 lb

## 2022-11-17 DIAGNOSIS — Z992 Dependence on renal dialysis: Secondary | ICD-10-CM

## 2022-11-17 DIAGNOSIS — N186 End stage renal disease: Secondary | ICD-10-CM

## 2022-11-17 NOTE — Progress Notes (Signed)
    Postoperative Access Visit   History of Present Illness   Joshua Case is a 62 y.o. year old male who presents for postoperative follow-up for: left radiocephalic arteriovenous fistula revision with side branch ligation and superficialization (Date: 10/01/22).  The patient's wounds are healed.  The patient denies steal symptoms.  The patient is able to complete their activities of daily living.  He is currently dialyzing via [facility] TDC on a TThS schedule at the [facility] location.   Physical Examination   Vitals:   11/17/22 1414  BP: 116/67  Pulse: 93  Resp: 18  Temp: 97.9 F (36.6 C)  TempSrc: Temporal  SpO2: 98%  Weight: 293 lb (132.9 kg)  Height: 6\' 2"  (1.88 m)   Body mass index is 37.62 kg/m.  left arm Incisions are healed, palpable radial pulse, hand grip is 5/5, sensation in digits is intact, palpable thrill, bruit can be auscultated     Medical Decision Making   Joshua Case is a 62 y.o. year old male who presents s/p left radiocephalic arteriovenous fistula revision  Patent fistula without signs or symptoms of steal syndrome The patient's access will be ready for use tomorrow 11/18/22 The patient's tunneled dialysis catheter can be removed when Nephrology is comfortable with the performance of the fistula The patient may follow up on a prn basis   Dagoberto Ligas PA-C [facility] [place] Office: [facility] MD: Trula Slade

## 2022-12-15 ENCOUNTER — Ambulatory Visit (INDEPENDENT_AMBULATORY_CARE_PROVIDER_SITE_OTHER): Payer: Federal, State, Local not specified - PPO | Admitting: Podiatry

## 2022-12-15 DIAGNOSIS — Z91199 Patient's noncompliance with other medical treatment and regimen due to unspecified reason: Secondary | ICD-10-CM

## 2022-12-15 NOTE — Progress Notes (Signed)
1. No-show for appointment
